# Patient Record
Sex: Female | Born: 1960 | Race: Black or African American | Hispanic: No | Smoking: Never smoker
Health system: Southern US, Community
[De-identification: ages and names within clinical notes are randomized; demographics above are authoritative.]

## PROBLEM LIST (undated history)

## (undated) DIAGNOSIS — T8859XA Other complications of anesthesia, initial encounter: Secondary | ICD-10-CM

## (undated) DIAGNOSIS — J309 Allergic rhinitis, unspecified: Secondary | ICD-10-CM

## (undated) DIAGNOSIS — T4145XA Adverse effect of unspecified anesthetic, initial encounter: Secondary | ICD-10-CM

## (undated) DIAGNOSIS — K5730 Diverticulosis of large intestine without perforation or abscess without bleeding: Secondary | ICD-10-CM

## (undated) DIAGNOSIS — M51369 Other intervertebral disc degeneration, lumbar region without mention of lumbar back pain or lower extremity pain: Secondary | ICD-10-CM

## (undated) DIAGNOSIS — M48061 Spinal stenosis, lumbar region without neurogenic claudication: Secondary | ICD-10-CM

## (undated) DIAGNOSIS — E669 Obesity, unspecified: Secondary | ICD-10-CM

## (undated) DIAGNOSIS — R3129 Other microscopic hematuria: Secondary | ICD-10-CM

## (undated) DIAGNOSIS — M5136 Other intervertebral disc degeneration, lumbar region: Secondary | ICD-10-CM

## (undated) DIAGNOSIS — M722 Plantar fascial fibromatosis: Secondary | ICD-10-CM

## (undated) DIAGNOSIS — K219 Gastro-esophageal reflux disease without esophagitis: Secondary | ICD-10-CM

## (undated) DIAGNOSIS — G4733 Obstructive sleep apnea (adult) (pediatric): Secondary | ICD-10-CM

## (undated) DIAGNOSIS — R42 Dizziness and giddiness: Secondary | ICD-10-CM

## (undated) DIAGNOSIS — G5603 Carpal tunnel syndrome, bilateral upper limbs: Secondary | ICD-10-CM

## (undated) DIAGNOSIS — IMO0002 Reserved for concepts with insufficient information to code with codable children: Secondary | ICD-10-CM

## (undated) DIAGNOSIS — N814 Uterovaginal prolapse, unspecified: Secondary | ICD-10-CM

## (undated) DIAGNOSIS — N8112 Cystocele, lateral: Secondary | ICD-10-CM

## (undated) DIAGNOSIS — E785 Hyperlipidemia, unspecified: Secondary | ICD-10-CM

## (undated) DIAGNOSIS — J189 Pneumonia, unspecified organism: Secondary | ICD-10-CM

## (undated) DIAGNOSIS — N841 Polyp of cervix uteri: Secondary | ICD-10-CM

## (undated) DIAGNOSIS — I10 Essential (primary) hypertension: Principal | ICD-10-CM

## (undated) HISTORY — PX: ROTATOR CUFF REPAIR: SHX139

## (undated) HISTORY — DX: Reserved for concepts with insufficient information to code with codable children: IMO0002

## (undated) HISTORY — DX: Diverticulosis of large intestine without perforation or abscess without bleeding: K57.30

## (undated) HISTORY — DX: Essential (primary) hypertension: I10

## (undated) HISTORY — DX: Dizziness and giddiness: R42

## (undated) HISTORY — DX: Hyperlipidemia, unspecified: E78.5

## (undated) HISTORY — PX: ABDOMINAL HYSTERECTOMY: SUR658

## (undated) HISTORY — DX: Obesity, unspecified: E66.9

## (undated) HISTORY — DX: Cystocele, lateral: N81.12

## (undated) HISTORY — DX: Obstructive sleep apnea (adult) (pediatric): G47.33

## (undated) HISTORY — PX: COLONOSCOPY: SHX174

## (undated) HISTORY — DX: Allergic rhinitis, unspecified: J30.9

## (undated) HISTORY — DX: Uterovaginal prolapse, unspecified: N81.4

## (undated) HISTORY — DX: Gastro-esophageal reflux disease without esophagitis: K21.9

## (undated) HISTORY — DX: Plantar fascial fibromatosis: M72.2

## (undated) HISTORY — DX: Polyp of cervix uteri: N84.1

## (undated) HISTORY — DX: Other microscopic hematuria: R31.29

---

## 1990-11-30 HISTORY — PX: TUBAL LIGATION: SHX77

## 2000-05-18 ENCOUNTER — Ambulatory Visit (HOSPITAL_BASED_OUTPATIENT_CLINIC_OR_DEPARTMENT_OTHER): Admission: RE | Admit: 2000-05-18 | Discharge: 2000-05-18 | Payer: Self-pay | Admitting: Orthopaedic Surgery

## 2001-07-21 ENCOUNTER — Encounter: Payer: Self-pay | Admitting: Family Medicine

## 2001-07-21 ENCOUNTER — Encounter: Admission: RE | Admit: 2001-07-21 | Discharge: 2001-07-21 | Payer: Self-pay | Admitting: Family Medicine

## 2002-05-12 ENCOUNTER — Ambulatory Visit (HOSPITAL_COMMUNITY): Admission: RE | Admit: 2002-05-12 | Discharge: 2002-05-12 | Payer: Self-pay | Admitting: Family Medicine

## 2002-05-12 ENCOUNTER — Encounter: Payer: Self-pay | Admitting: Family Medicine

## 2002-09-21 ENCOUNTER — Ambulatory Visit (HOSPITAL_COMMUNITY): Admission: RE | Admit: 2002-09-21 | Discharge: 2002-09-21 | Payer: Self-pay | Admitting: Family Medicine

## 2002-09-21 ENCOUNTER — Encounter: Payer: Self-pay | Admitting: Family Medicine

## 2003-10-04 ENCOUNTER — Encounter: Admission: RE | Admit: 2003-10-04 | Discharge: 2003-10-04 | Payer: Self-pay | Admitting: Family Medicine

## 2004-10-07 ENCOUNTER — Ambulatory Visit: Payer: Self-pay | Admitting: Family Medicine

## 2004-10-15 ENCOUNTER — Ambulatory Visit: Payer: Self-pay | Admitting: *Deleted

## 2004-12-16 ENCOUNTER — Ambulatory Visit: Payer: Self-pay | Admitting: Family Medicine

## 2004-12-19 ENCOUNTER — Ambulatory Visit: Payer: Self-pay | Admitting: Family Medicine

## 2004-12-23 ENCOUNTER — Ambulatory Visit: Payer: Self-pay | Admitting: Family Medicine

## 2005-01-02 ENCOUNTER — Ambulatory Visit: Payer: Self-pay | Admitting: Family Medicine

## 2005-01-07 ENCOUNTER — Ambulatory Visit: Payer: Self-pay | Admitting: Family Medicine

## 2005-02-23 ENCOUNTER — Ambulatory Visit: Payer: Self-pay | Admitting: Family Medicine

## 2005-06-12 ENCOUNTER — Encounter: Admission: RE | Admit: 2005-06-12 | Discharge: 2005-06-12 | Payer: Self-pay | Admitting: Family Medicine

## 2006-11-25 ENCOUNTER — Encounter: Admission: RE | Admit: 2006-11-25 | Discharge: 2006-11-25 | Payer: Self-pay | Admitting: Emergency Medicine

## 2006-12-09 ENCOUNTER — Ambulatory Visit: Payer: Self-pay | Admitting: Gastroenterology

## 2007-04-29 ENCOUNTER — Ambulatory Visit (HOSPITAL_COMMUNITY): Admission: RE | Admit: 2007-04-29 | Discharge: 2007-04-29 | Payer: Self-pay | Admitting: Emergency Medicine

## 2007-04-30 ENCOUNTER — Ambulatory Visit (HOSPITAL_COMMUNITY): Admission: RE | Admit: 2007-04-30 | Discharge: 2007-04-30 | Payer: Self-pay | Admitting: Emergency Medicine

## 2007-11-15 ENCOUNTER — Ambulatory Visit (HOSPITAL_COMMUNITY): Admission: RE | Admit: 2007-11-15 | Discharge: 2007-11-15 | Payer: Self-pay | Admitting: Obstetrics & Gynecology

## 2008-09-25 ENCOUNTER — Ambulatory Visit: Payer: Self-pay | Admitting: Family Medicine

## 2008-09-25 ENCOUNTER — Encounter (INDEPENDENT_AMBULATORY_CARE_PROVIDER_SITE_OTHER): Payer: Self-pay | Admitting: Family Medicine

## 2008-09-25 DIAGNOSIS — I10 Essential (primary) hypertension: Secondary | ICD-10-CM

## 2008-09-25 DIAGNOSIS — E785 Hyperlipidemia, unspecified: Secondary | ICD-10-CM

## 2008-09-25 HISTORY — DX: Hyperlipidemia, unspecified: E78.5

## 2008-09-25 HISTORY — DX: Essential (primary) hypertension: I10

## 2008-09-25 LAB — CONVERTED CEMR LAB
Ketones, urine, test strip: NEGATIVE
Protein, U semiquant: NEGATIVE
Urobilinogen, UA: 0.2
WBC Urine, dipstick: NEGATIVE
pH: 5.5

## 2008-09-27 LAB — CONVERTED CEMR LAB
Albumin: 4.4 g/dL (ref 3.5–5.2)
Alkaline Phosphatase: 113 units/L (ref 39–117)
BUN: 12 mg/dL (ref 6–23)
Basophils Absolute: 0 10*3/uL (ref 0.0–0.1)
Chloride: 98 meq/L (ref 96–112)
Cholesterol: 295 mg/dL — ABNORMAL HIGH (ref 0–200)
Eosinophils Absolute: 0.1 10*3/uL (ref 0.0–0.7)
GC Probe Amp, Genital: NEGATIVE
Glucose, Bld: 80 mg/dL (ref 70–99)
Hemoglobin: 12.5 g/dL (ref 12.0–15.0)
Lymphocytes Relative: 36 % (ref 12–46)
Lymphs Abs: 2.4 10*3/uL (ref 0.7–4.0)
MCHC: 31 g/dL (ref 30.0–36.0)
Monocytes Relative: 6 % (ref 3–12)
Neutrophils Relative %: 56 % (ref 43–77)
RBC: 4.68 M/uL (ref 3.87–5.11)
RDW: 15.6 % — ABNORMAL HIGH (ref 11.5–15.5)
Sodium: 139 meq/L (ref 135–145)
Total Protein: 7.7 g/dL (ref 6.0–8.3)
Triglycerides: 129 mg/dL (ref <150)
VLDL: 26 mg/dL (ref 0–40)
WBC: 6.6 10*3/uL (ref 4.0–10.5)

## 2008-10-03 ENCOUNTER — Telehealth (INDEPENDENT_AMBULATORY_CARE_PROVIDER_SITE_OTHER): Payer: Self-pay | Admitting: Family Medicine

## 2008-10-08 ENCOUNTER — Encounter (INDEPENDENT_AMBULATORY_CARE_PROVIDER_SITE_OTHER): Payer: Self-pay | Admitting: Family Medicine

## 2008-10-11 ENCOUNTER — Ambulatory Visit: Payer: Self-pay | Admitting: Family Medicine

## 2008-10-11 LAB — CONVERTED CEMR LAB

## 2008-11-15 ENCOUNTER — Ambulatory Visit (HOSPITAL_COMMUNITY): Admission: RE | Admit: 2008-11-15 | Discharge: 2008-11-15 | Payer: Self-pay | Admitting: Family Medicine

## 2008-11-19 ENCOUNTER — Ambulatory Visit: Payer: Self-pay | Admitting: Family Medicine

## 2008-11-19 DIAGNOSIS — N814 Uterovaginal prolapse, unspecified: Secondary | ICD-10-CM | POA: Insufficient documentation

## 2008-11-19 DIAGNOSIS — J029 Acute pharyngitis, unspecified: Secondary | ICD-10-CM

## 2008-11-19 HISTORY — DX: Uterovaginal prolapse, unspecified: N81.4

## 2008-11-30 HISTORY — PX: CHOLECYSTECTOMY: SHX55

## 2009-01-21 ENCOUNTER — Ambulatory Visit: Payer: Self-pay | Admitting: Family Medicine

## 2009-01-21 LAB — CONVERTED CEMR LAB
Chlamydia, Swab/Urine, PCR: NEGATIVE
GC Probe Amp, Urine: NEGATIVE

## 2009-01-22 ENCOUNTER — Encounter (INDEPENDENT_AMBULATORY_CARE_PROVIDER_SITE_OTHER): Payer: Self-pay | Admitting: Family Medicine

## 2009-01-31 ENCOUNTER — Telehealth (INDEPENDENT_AMBULATORY_CARE_PROVIDER_SITE_OTHER): Payer: Self-pay | Admitting: Family Medicine

## 2009-07-09 ENCOUNTER — Telehealth (INDEPENDENT_AMBULATORY_CARE_PROVIDER_SITE_OTHER): Payer: Self-pay | Admitting: Family Medicine

## 2009-09-05 ENCOUNTER — Ambulatory Visit: Payer: Self-pay | Admitting: Physician Assistant

## 2009-09-05 DIAGNOSIS — K219 Gastro-esophageal reflux disease without esophagitis: Secondary | ICD-10-CM | POA: Insufficient documentation

## 2009-09-05 DIAGNOSIS — R109 Unspecified abdominal pain: Secondary | ICD-10-CM | POA: Insufficient documentation

## 2009-09-05 HISTORY — DX: Gastro-esophageal reflux disease without esophagitis: K21.9

## 2009-09-09 ENCOUNTER — Encounter: Payer: Self-pay | Admitting: Physician Assistant

## 2009-09-09 LAB — CONVERTED CEMR LAB
Iron: 60 ug/dL (ref 42–145)
TIBC: 387 ug/dL (ref 250–470)
Vitamin B-12: 397 pg/mL (ref 211–911)

## 2009-09-10 ENCOUNTER — Encounter: Payer: Self-pay | Admitting: Physician Assistant

## 2009-09-10 LAB — CONVERTED CEMR LAB
ALT: 16 units/L (ref 0–35)
AST: 14 units/L (ref 0–37)
Albumin: 4.2 g/dL (ref 3.5–5.2)
Basophils Absolute: 0 10*3/uL (ref 0.0–0.1)
Creatinine, Ser: 0.79 mg/dL (ref 0.40–1.20)
Eosinophils Absolute: 0.5 10*3/uL (ref 0.0–0.7)
Eosinophils Relative: 6 % — ABNORMAL HIGH (ref 0–5)
Glucose, Bld: 91 mg/dL (ref 70–99)
HCT: 37.4 % (ref 36.0–46.0)
Helicobacter pylori, IgM: <0.89 (ref <0.89)
Lymphocytes Relative: 37 % (ref 12–46)
Lymphs Abs: 3.1 10*3/uL (ref 0.7–4.0)
Monocytes Absolute: 0.5 10*3/uL (ref 0.1–1.0)
Monocytes Relative: 6 % (ref 3–12)
Neutro Abs: 4.2 10*3/uL (ref 1.7–7.7)
Neutrophils Relative %: 51 % (ref 43–77)
Platelets: 394 10*3/uL (ref 150–400)
Potassium: 4.1 meq/L (ref 3.5–5.3)
RBC: 4.43 M/uL (ref 3.87–5.11)
Sodium: 142 meq/L (ref 135–145)
Total Bilirubin: 0.6 mg/dL (ref 0.3–1.2)
Total Protein: 7.1 g/dL (ref 6.0–8.3)

## 2009-09-11 ENCOUNTER — Ambulatory Visit (HOSPITAL_COMMUNITY): Admission: RE | Admit: 2009-09-11 | Discharge: 2009-09-11 | Payer: Self-pay | Admitting: Internal Medicine

## 2009-09-12 ENCOUNTER — Emergency Department (HOSPITAL_COMMUNITY): Admission: EM | Admit: 2009-09-12 | Discharge: 2009-09-13 | Payer: Self-pay | Admitting: Emergency Medicine

## 2009-09-16 ENCOUNTER — Telehealth: Payer: Self-pay | Admitting: Physician Assistant

## 2009-09-17 ENCOUNTER — Encounter: Payer: Self-pay | Admitting: Physician Assistant

## 2009-09-25 ENCOUNTER — Telehealth: Payer: Self-pay | Admitting: Physician Assistant

## 2009-09-26 ENCOUNTER — Ambulatory Visit: Payer: Self-pay | Admitting: Internal Medicine

## 2009-10-01 ENCOUNTER — Encounter (INDEPENDENT_AMBULATORY_CARE_PROVIDER_SITE_OTHER): Payer: Self-pay | Admitting: General Surgery

## 2009-10-01 ENCOUNTER — Ambulatory Visit (HOSPITAL_COMMUNITY): Admission: RE | Admit: 2009-10-01 | Discharge: 2009-10-01 | Payer: Self-pay | Admitting: General Surgery

## 2009-10-03 ENCOUNTER — Telehealth: Payer: Self-pay | Admitting: Physician Assistant

## 2009-10-10 ENCOUNTER — Encounter: Payer: Self-pay | Admitting: Physician Assistant

## 2009-10-17 ENCOUNTER — Telehealth: Payer: Self-pay | Admitting: Physician Assistant

## 2009-11-12 ENCOUNTER — Encounter: Payer: Self-pay | Admitting: Physician Assistant

## 2009-11-14 ENCOUNTER — Encounter: Payer: Self-pay | Admitting: Physician Assistant

## 2009-11-18 ENCOUNTER — Encounter: Admission: RE | Admit: 2009-11-18 | Discharge: 2009-11-18 | Payer: Self-pay | Admitting: Internal Medicine

## 2009-11-21 ENCOUNTER — Encounter: Payer: Self-pay | Admitting: Physician Assistant

## 2009-11-25 ENCOUNTER — Telehealth: Payer: Self-pay | Admitting: Physician Assistant

## 2009-11-26 ENCOUNTER — Emergency Department (HOSPITAL_COMMUNITY): Admission: EM | Admit: 2009-11-26 | Discharge: 2009-11-26 | Payer: Self-pay | Admitting: Emergency Medicine

## 2010-01-13 ENCOUNTER — Ambulatory Visit: Payer: Self-pay | Admitting: Physician Assistant

## 2010-01-13 ENCOUNTER — Encounter (INDEPENDENT_AMBULATORY_CARE_PROVIDER_SITE_OTHER): Payer: Self-pay | Admitting: Nurse Practitioner

## 2010-01-13 DIAGNOSIS — IMO0002 Reserved for concepts with insufficient information to code with codable children: Secondary | ICD-10-CM

## 2010-01-13 DIAGNOSIS — R3129 Other microscopic hematuria: Secondary | ICD-10-CM | POA: Insufficient documentation

## 2010-01-13 DIAGNOSIS — N8112 Cystocele, lateral: Secondary | ICD-10-CM

## 2010-01-13 DIAGNOSIS — N951 Menopausal and female climacteric states: Secondary | ICD-10-CM

## 2010-01-13 HISTORY — DX: Cystocele, lateral: N81.12

## 2010-01-13 HISTORY — DX: Other microscopic hematuria: R31.29

## 2010-01-13 LAB — CONVERTED CEMR LAB
Bilirubin Urine: NEGATIVE
Cholesterol, target level: 200 mg/dL
KOH Prep: NEGATIVE
LDL Goal: 160 mg/dL
Rapid HIV Screen: NEGATIVE
WBC Urine, dipstick: NEGATIVE
pH: 6

## 2010-01-14 ENCOUNTER — Encounter (INDEPENDENT_AMBULATORY_CARE_PROVIDER_SITE_OTHER): Payer: Self-pay | Admitting: Nurse Practitioner

## 2010-01-14 LAB — CONVERTED CEMR LAB
ALT: 17 units/L (ref 0–35)
AST: 16 units/L (ref 0–37)
Albumin: 4.4 g/dL (ref 3.5–5.2)
BUN: 17 mg/dL (ref 6–23)
CA 125: 7.3 units/mL (ref 0.0–30.2)
Calcium: 9.6 mg/dL (ref 8.4–10.5)
Cholesterol: 210 mg/dL — ABNORMAL HIGH (ref 0–200)
GC Probe Amp, Genital: NEGATIVE
Glucose, Bld: 92 mg/dL (ref 70–99)
LDL Cholesterol: 137 mg/dL — ABNORMAL HIGH (ref 0–99)
Microalb, Ur: 1.82 mg/dL (ref 0.00–1.89)
Sodium: 140 meq/L (ref 135–145)
TSH: 1.788 microintl units/mL (ref 0.350–4.500)
Total Bilirubin: 0.7 mg/dL (ref 0.3–1.2)
Total Protein: 7.1 g/dL (ref 6.0–8.3)
VLDL: 18 mg/dL (ref 0–40)

## 2010-01-16 LAB — CONVERTED CEMR LAB: Pap Smear: NEGATIVE

## 2010-02-11 ENCOUNTER — Ambulatory Visit: Payer: Self-pay | Admitting: Nurse Practitioner

## 2010-02-12 ENCOUNTER — Encounter: Payer: Self-pay | Admitting: Physician Assistant

## 2010-02-12 LAB — CONVERTED CEMR LAB
CO2: 28 meq/L (ref 19–32)
Calcium: 9.8 mg/dL (ref 8.4–10.5)
Chloride: 100 meq/L (ref 96–112)
Creatinine, Ser: 0.81 mg/dL (ref 0.40–1.20)
Glucose, Bld: 88 mg/dL (ref 70–99)
Potassium: 4.1 meq/L (ref 3.5–5.3)

## 2010-02-14 ENCOUNTER — Telehealth (INDEPENDENT_AMBULATORY_CARE_PROVIDER_SITE_OTHER): Payer: Self-pay | Admitting: *Deleted

## 2010-03-03 ENCOUNTER — Encounter (INDEPENDENT_AMBULATORY_CARE_PROVIDER_SITE_OTHER): Payer: Self-pay

## 2010-03-04 ENCOUNTER — Ambulatory Visit: Payer: Self-pay | Admitting: Gastroenterology

## 2010-03-05 ENCOUNTER — Ambulatory Visit: Payer: Self-pay | Admitting: Physician Assistant

## 2010-03-07 ENCOUNTER — Encounter (INDEPENDENT_AMBULATORY_CARE_PROVIDER_SITE_OTHER): Payer: Self-pay | Admitting: Nurse Practitioner

## 2010-03-18 ENCOUNTER — Ambulatory Visit: Payer: Self-pay | Admitting: Gastroenterology

## 2010-03-26 ENCOUNTER — Encounter: Payer: Self-pay | Admitting: Physician Assistant

## 2010-03-26 ENCOUNTER — Telehealth (INDEPENDENT_AMBULATORY_CARE_PROVIDER_SITE_OTHER): Payer: Self-pay | Admitting: Nurse Practitioner

## 2010-03-26 DIAGNOSIS — K5730 Diverticulosis of large intestine without perforation or abscess without bleeding: Secondary | ICD-10-CM

## 2010-03-26 HISTORY — DX: Diverticulosis of large intestine without perforation or abscess without bleeding: K57.30

## 2010-04-16 ENCOUNTER — Telehealth: Payer: Self-pay | Admitting: Physician Assistant

## 2010-06-20 ENCOUNTER — Encounter: Payer: Self-pay | Admitting: Physician Assistant

## 2010-06-20 DIAGNOSIS — G4733 Obstructive sleep apnea (adult) (pediatric): Secondary | ICD-10-CM | POA: Insufficient documentation

## 2010-06-20 DIAGNOSIS — Z9989 Dependence on other enabling machines and devices: Secondary | ICD-10-CM

## 2010-06-20 HISTORY — DX: Obstructive sleep apnea (adult) (pediatric): G47.33

## 2010-06-23 ENCOUNTER — Encounter: Payer: Self-pay | Admitting: Physician Assistant

## 2010-08-22 ENCOUNTER — Telehealth: Payer: Self-pay | Admitting: Physician Assistant

## 2010-08-28 ENCOUNTER — Ambulatory Visit: Payer: Self-pay | Admitting: Nurse Practitioner

## 2010-08-28 DIAGNOSIS — Z6841 Body Mass Index (BMI) 40.0 and over, adult: Secondary | ICD-10-CM

## 2010-08-28 DIAGNOSIS — E669 Obesity, unspecified: Secondary | ICD-10-CM

## 2010-08-28 HISTORY — DX: Obesity, unspecified: E66.9

## 2010-11-14 ENCOUNTER — Telehealth (INDEPENDENT_AMBULATORY_CARE_PROVIDER_SITE_OTHER): Payer: Self-pay | Admitting: Nurse Practitioner

## 2010-12-02 ENCOUNTER — Telehealth (INDEPENDENT_AMBULATORY_CARE_PROVIDER_SITE_OTHER): Payer: Self-pay | Admitting: Nurse Practitioner

## 2010-12-02 ENCOUNTER — Emergency Department (HOSPITAL_COMMUNITY)
Admission: EM | Admit: 2010-12-02 | Discharge: 2010-12-02 | Payer: Self-pay | Source: Home / Self Care | Admitting: Emergency Medicine

## 2010-12-03 ENCOUNTER — Ambulatory Visit: Admit: 2010-12-03 | Payer: Self-pay | Admitting: Internal Medicine

## 2010-12-04 ENCOUNTER — Telehealth (INDEPENDENT_AMBULATORY_CARE_PROVIDER_SITE_OTHER): Payer: Self-pay | Admitting: Nurse Practitioner

## 2010-12-04 ENCOUNTER — Ambulatory Visit
Admission: RE | Admit: 2010-12-04 | Discharge: 2010-12-04 | Payer: Self-pay | Source: Home / Self Care | Attending: Nurse Practitioner | Admitting: Nurse Practitioner

## 2010-12-11 ENCOUNTER — Telehealth (INDEPENDENT_AMBULATORY_CARE_PROVIDER_SITE_OTHER): Payer: Self-pay | Admitting: Nurse Practitioner

## 2010-12-20 ENCOUNTER — Encounter: Payer: Self-pay | Admitting: Family Medicine

## 2010-12-30 NOTE — Letter (Signed)
Summary: [facility]   Imported By: Arta Bruce 12/13/2009 12:33:27  _____________________________________________________________________  External Attachment:    Type:   Image     Comment:   External Document

## 2010-12-30 NOTE — Assessment & Plan Note (Signed)
Summary: HTN   Vital Signs:  Patient profile:   50 year old female Menstrual status:  postmenopausal Weight:      313.6 pounds BMI:     50.80 Temp:     97.3 degrees F oral Pulse rate:   76 / minute Pulse rhythm:   regular Resp:     20 per minute BP sitting:   160 / 100  (left arm) Cuff size:   large  Vitals Entered By: Levon Hedger (August 28, 2010 10:44 AM)  Nutrition Counseling: Patient's BMI is greater than 25 and therefore counseled on weight management options. CC: follow-up visit BP, Hypertension Management, Lipid Management Pain Assessment Patient in pain? no       Does patient need assistance? Functional Status Self care Ambulation Normal Comments pt is not taking pravastatin had too many complications with medication   CC:  follow-up visit BP, Hypertension Management, and Lipid Management.  History of Present Illness:  Pt into the office for f/u on HTN  Hypertension History:      She complains of peripheral edema, but denies headache, chest pain, and palpitations.  She notes no problems with any antihypertensive medication side effects.  Rx refilled on clonidine last week. Although she has only been taking it only at night.  Pt seems to think that it was affecting her mood. She is also taking HCTZ.        Positive major cardiovascular risk factors include hyperlipidemia and hypertension.  Negative major cardiovascular risk factors include female age less than 52 years old, no history of diabetes, negative family history for ischemic heart disease, and non-tobacco-user status.        Further assessment for target organ damage reveals no history of ASHD, cardiac end-organ damage (CHF/LVH), stroke/TIA, peripheral vascular disease, renal insufficiency, or hypertensive retinopathy.    Lipid Management History:      Positive NCEP/ATP III risk factors include hypertension.  Negative NCEP/ATP III risk factors include female age less than 109 years old, non-diabetic,  no family history for ischemic heart disease, non-tobacco-user status, no ASHD (atherosclerotic heart disease), no prior stroke/TIA, no peripheral vascular disease, and no history of aortic aneurysm.        The patient states that she does not know about the "Therapeutic Lifestyle Change" diet.  The patient does not know about adjunctive measures for cholesterol lowering.  Comments include: aches in the shoulders.      Habits & Providers  Alcohol-Tobacco-Diet     Alcohol drinks/day: 0     Tobacco Status: never     Passive Smoke Exposure: no  Exercise-Depression-Behavior     Does Patient Exercise: no     Depression Counseling: not indicated; screening negative for depression     Drug Use: no     Seat Belt Use: 100     Sun Exposure: occasionally  Allergies (verified): 1)  ! Promethazine Hcl (Promethazine Hcl) 2)  ! * Bp Pill 3)  ! Ace Inhibitors  Review of Systems General:  Denies fever. CV:  Complains of swelling of feet. Resp:  Denies cough. GI:  Complains of nausea and vomiting; denies abdominal pain.  Physical Exam  General:  alert.   Head:  normocephalic.   Lungs:  normal breath sounds.   Heart:  normal rate and regular rhythm.   Abdomen:  soft, non-tender, and normal bowel sounds.   Msk:  up to the exam table Neurologic:  alert & oriented X3.   Skin:  color normal.   Psych:  Oriented X3.     Impression & Recommendations:  Problem # 1:  ESSENTIAL HYPERTENSION (ICD-401.9) BP is elevated today. advised pt to take clonidine two times a day  DASH diet reviewed Her updated medication list for this problem includes:    Hydrochlorothiazide 25 Mg Tabs (Hydrochlorothiazide) .Marland Kitchen... 1 by mouth once daily    Clonidine Hcl 0.1 Mg Tabs (Clonidine hcl) .Marland Kitchen... Take one tablet two times a day    Furosemide 20 Mg Tabs (Furosemide) ..... One tablet by mouth daily as needed for fluid  Problem # 2:  HYPERLIPIDEMIA (ICD-272.4) pt is not taking meds she would like to try lifestyle  modifications will recheck 12/2010 Her updated medication list for this problem includes:    Pravastatin Sodium 10 Mg Tabs (Pravastatin sodium) ..... Hold  Problem # 3:  OBESITY (ICD-278.00) advised pt to increase diet and exercise will sign pt up for a nutrition class  Complete Medication List: 1)  Ibuprofen 800 Mg Tabs (Ibuprofen) .... Take one tablet by mouth as needed every six hours 2)  Multivitamins Caps (Multiple vitamin) .... Take one tablet by mouth three times per week 3)  Pravastatin Sodium 10 Mg Tabs (Pravastatin sodium) .... Hold 4)  Hydrochlorothiazide 25 Mg Tabs (Hydrochlorothiazide) .Marland Kitchen.. 1 by mouth once daily 5)  Protonix 40 Mg Tbec (Pantoprazole sodium) .... Take 1 tablet by mouth two times a day 6)  Clonidine Hcl 0.1 Mg Tabs (Clonidine hcl) .... Take one tablet two times a day 7)  Furosemide 20 Mg Tabs (Furosemide) .... One tablet by mouth daily as needed for fluid  Other Orders: Flu Vaccine 26yrs + (13086) Admin 1st Vaccine (57846) Admin 1st Vaccine [facility]) [phone])  Hypertension Assessment/Plan:      The patient's hypertensive risk group is category B: At least one risk factor (excluding diabetes) with no target organ damage.  Her calculated 10 year risk of coronary heart disease is 7 %.  Today's blood pressure is 160/100.  Her blood pressure goal is < 140/90.  Lipid Assessment/Plan:      Based on NCEP/ATP III, the patient's risk factor category is "0-1 risk factors".  The patient's lipid goals are as follows: Total cholesterol goal is 200; LDL cholesterol goal is 160; HDL cholesterol goal is 40; Triglyceride goal is 150.    Patient Instructions: 1)  High Blood pressure - You need to take your blood pressure pills (clonidine 0.1mg  by mouth two times a day) 2)  You should also take your HCTZ 25mg  by mouth daily. 3)  Follow up in this office 2 weeks for blood pressure 4)  Take your blood pressure medications before this visit 5)  check lasix use. 6)  You will be  signed up for the nutrition class Prescriptions: FUROSEMIDE 20 MG TABS (FUROSEMIDE) One tablet by mouth daily as needed for fluid  #10 x 0   Entered and Authorized by:   Lehman Prom FNP   Signed by:   Lehman Prom FNP on 08/28/2010   Method used:   Print then Give to Patient   RxID:   [id number]   Prevention & Chronic Care Immunizations   Influenza vaccine: Fluvax 3+  (08/28/2010)    Tetanus booster: 09/25/2008: Tdap    Pneumococcal vaccine: Not documented  Other Screening   Pap smear:  Specimen Adequacy: Satisfactory for evaluation.   Interpretation/Result:Negative for intraepithelial Lesion or Malignancy.     (01/13/2010)   Pap smear due: 12/2010    Mammogram: ASSESSMENT: Negative - BI-RADS 1^MM DIGITAL SCREENING  (  11/18/2009)   Smoking status: never  (08/28/2010)  Lipids   Total Cholesterol: 210  (01/13/2010)   LDL: 137  (01/13/2010)   LDL Direct: Not documented   HDL: 55  (01/13/2010)   Triglycerides: 90  (01/13/2010)    SGOT (AST): 16  (01/13/2010)   SGPT (ALT): 17  (01/13/2010)   Alkaline phosphatase: 92  (01/13/2010)   Total bilirubin: 0.7  (01/13/2010)  Hypertension   Last Blood Pressure: 160 / 100  (08/28/2010)   Serum creatinine: 0.81  (02/12/2010)   Serum potassium 4.1  (02/12/2010)  Self-Management Support :    Hypertension self-management support: Not documented    Lipid self-management support: Not documented    Nursing Instructions: Give Flu vaccine today      Influenza Vaccine    Vaccine Type: Fluvax 3+    Site: right deltoid    Mfr: [employer]oSmithK[employer]    Dose: 0.5 ml    Route: IM    Given by: Levon Hedger    Exp. Date: 05/2011    Lot #: [id number]    VIS given: 06/24/10 version given August 28, 2010.  Flu Vaccine Consent Questions    Do you have a history of severe allergic reactions to this vaccine? no    Any prior history of allergic reactions to egg and/or gelatin? no    Do you have a sensitivity to the  preservative Thimersol? no    Do you have a past history of Guillan-Barre Syndrome? no    Do you currently have an acute febrile illness? no    Have you ever had a severe reaction to latex? no    Vaccine information given and explained to patient? no    Are you currently pregnant? no NDC  [phone]

## 2010-12-30 NOTE — Progress Notes (Signed)
Summary: Requesting refills  Phone Note Call from Patient Call back at [facility] Phone [phone]   Summary of Call: The pt is requesting more refills from clonidine medication [facility] ([address]) Alben Spittle PA-c Initial call taken by: Manon Hilding,  Apr 16, 2010 11:11 AM  Follow-up for Phone Call        forward to provider, last filled on 02-11-10 with one refill.[address] Follow-up by: Arm[place] Shannon,  Apr 16, 2010 12:24 PM  Additional Follow-up for Phone Call Additional follow up Details #1::        pt is aware Additional Follow-up by: Armenia Shannon,  Apr 17, 2010 8:10 AM    Prescriptions: CLONIDINE HCL 0.1 MG TABS (CLONIDINE HCL) take one tablet two times a day  #60 x 3   Entered and Authorized by:   Tereso Newcomer PA-C   Signed by:   Tereso Newcomer PA-C on 04/16/2010   Method used:   Electronically to        [facility] [address] [phone]* (retail)       [address]       [place], [place]  [address]       Ph: [phone] or [phone]       Fax: [phone]   RxID:   [id number]

## 2010-12-30 NOTE — Miscellaneous (Signed)
Summary: Colo done 4.2011 . . . needs repeat in 2016  Clinical Lists Changes  Problems: Added new problem of DIVERTICULOSIS, COLON (ICD-562.10) Observations: Added new observation of PAST MED HX: Current Problems:  HYPERLIPIDEMIA (ICD-272.4) ESSENTIAL HYPERTENSION (ICD-401.9) LBP...chronic...sees Dr[facility]Hilts at [facility]land Kitchenlast seen 9/09.  h/o MRSA abscess in 2006 Colo done in 02/2010. . . needs repeat in 2016  (03/26/2010 13:40) Added new observation of COLONRECACT: Repeat colonoscopy in 5 years.  (03/18/2010 13:40) Added new observation of COLONOSCOPY:  Results: Diverticulosis.        (03/18/2010 13:40)      Colonoscopy  Procedure date:  03/18/2010  Findings:       Results: Diverticulosis.         Comments:      Repeat colonoscopy in 5 years.     Past History:  Past Medical History: Current Problems:  HYPERLIPIDEMIA (ICD-272.4) ESSENTIAL HYPERTENSION (ICD-401.9) LBP...chronic...sees Dr.Hilts at [facility]Marland Kitchenlast seen 9/09.  h/o MRSA abscess in 2006 Colo done in 02/2010. . . needs repeat in 2016

## 2010-12-30 NOTE — Progress Notes (Signed)
Summary: Colonscopy update  Phone Note Outgoing Call   Summary of Call: Rec'd Colo report on Ms. Su Hilt. Saw that you did her CPP. Not sure if she is still mine or yours now. FYI. . . Needs repeat colo in 2016 Initial call taken by: Brynda Rim,  March 26, 2010 1:43 PM  Follow-up for Phone Call        noted Follow-up by: Lehman Prom FNP,  March 27, 2010 12:37 PM

## 2010-12-30 NOTE — Assessment & Plan Note (Signed)
Summary: 3-4 WEEK FU FOR BP CHECK//KT  Nurse Visit   Vital Signs:  Patient profile:   50 year old female Menstrual status:  postmenopausal Pulse rate:   74 / minute Pulse rhythm:   regular BP sitting:   131 / 58  (left arm) Cuff size:   large  Vitals Entered By: Levon Hedger (March 05, 2010 10:13 AM) CC: 3-4 week BP check...still having swelling in her feet...pt states medication makes her lightheaded Comments Per Oakley Orban BP is better today.   Allergies: 1)  ! Promethazine Hcl (Promethazine Hcl) 2)  ! * Bp Pill 3)  ! Ace Inhibitors  Orders Added: 1)  Est. Patient Level I [73220]  Prevention & Chronic Care Immunizations   Influenza vaccine: Fluvax 3+  (09/05/2009)    Tetanus booster: 09/25/2008: Tdap    Pneumococcal vaccine: Not documented  Other Screening   Pap smear:  Specimen Adequacy: Satisfactory for evaluation.   Interpretation/Result:Negative for intraepithelial Lesion or Malignancy.     (01/13/2010)   Pap smear due: 12/2010    Mammogram: ASSESSMENT: Negative - BI-RADS 1^MM DIGITAL SCREENING  (11/18/2009)   Smoking status: never  (01/13/2010)  Lipids   Total Cholesterol: 210  (01/13/2010)   LDL: 137  (01/13/2010)   LDL Direct: Not documented   HDL: 55  (01/13/2010)   Triglycerides: 90  (01/13/2010)    SGOT (AST): 16  (01/13/2010)   SGPT (ALT): 17  (01/13/2010)   Alkaline phosphatase: 92  (01/13/2010)   Total bilirubin: 0.7  (01/13/2010)  Hypertension   Last Blood Pressure: 131 / 58  (03/05/2010)   Serum creatinine: 0.81  (02/12/2010)   Serum potassium 4.1  (02/12/2010)  Self-Management Support :    Hypertension self-management support: Not documented    Lipid self-management support: Not documented

## 2010-12-30 NOTE — Assessment & Plan Note (Signed)
Summary: Complete Physical exam   Vital Signs:  Patient profile:   50 year old female Menstrual status:  postmenopausal Height:      66 inches Weight:      301.6 pounds Temp:     98.0 degrees F oral Pulse rate:   88 / minute Pulse rhythm:   regular Resp:     24 per minute BP sitting:   143 / 89  (left arm) Cuff size:   large  Vitals Entered By: Arthor Captain (January 13, 2010 10:16 AM) CC: CPP, Lipid Management, Abdominal Pain, Hypertension Management Is Patient Diabetic? No Pain Assessment Patient in pain? no       Does patient need assistance? Functional Status Self care Ambulation Normal LMP - Character: normal     Menstrual Status postmenopausal Last PAP Result NEGATIVE FOR INTRAEPITHELIAL LESIONS OR MALIGNANCY.   CC:  CPP, Lipid Management, Abdominal Pain, and Hypertension Management.  History of Present Illness:  Pt into the office for a complete physical exam.  Pap - Last done 1 year ago.  All previous PAP Smears ok. post-menopausal since for 1 year. 3 children, natural births Divorced  Mammogram - last done 11/18/2009 Maternal great aunt with breast cancer  Optho - no glasses or contact.  Last eye exam done back in 09/2009 was ok  Dental - last dental exam was 2 years ago. she maintains good hygiene with brusing and flossing.  tdap - up to date.  GI - Mother has a history of colon cancer and is a Physiological scientist. Never had a colonscopy.  Dyspepsia History:      There is a prior history of GERD.  The patient does not have a prior history of documented ulcer disease.  The dominant symptom is not heartburn or acid reflux.  An H-2 blocker medication is not currently being taken.  She has no history of a positive H. Pylori serology.  No previous upper endoscopy has been done.    Hypertension History:      She complains of peripheral edema, but denies headache, chest pain, and palpitations.  She takes her BP meds at night. She has an allergy to lisinopril  and she had to go to the ER and so she stopped the meds.  Pt started taking her son's norvasc and she noticed the swelling in her legs intensified.        Positive major cardiovascular risk factors include hyperlipidemia and hypertension.  Negative major cardiovascular risk factors include female age less than 86 years old, no history of diabetes, negative family history for ischemic heart disease, and non-tobacco-user status.        Further assessment for target organ damage reveals no history of ASHD, cardiac end-organ damage (CHF/LVH), stroke/TIA, peripheral vascular disease, renal insufficiency, or hypertensive retinopathy.    Lipid Management History:      Positive NCEP/ATP III risk factors include hypertension.  Negative NCEP/ATP III risk factors include female age less than 52 years old, non-diabetic, no family history for ischemic heart disease, non-tobacco-user status, no ASHD (atherosclerotic heart disease), no prior stroke/TIA, no peripheral vascular disease, and no history of aortic aneurysm.        The patient states that she does not know about the "Therapeutic Lifestyle Change" diet.  The patient does not know about adjunctive measures for cholesterol lowering.  Comments include: Pt is taking crestor but she only takes every other night. She was previously on lipitor but she had similar problems with joints so she stopped taking  it as well.  The patient denies any symptoms to suggest myopathy or liver disease.      Habits & Providers  Alcohol-Tobacco-Diet     Alcohol drinks/day: 0     Tobacco Status: never     Passive Smoke Exposure: no  Exercise-Depression-Behavior     Does Patient Exercise: no     Have you felt down or hopeless? no     Have you felt little pleasure in things? no     Depression Counseling: not indicated; screening negative for depression     Drug Use: no     Seat Belt Use: 100     Sun Exposure: occasionally  Allergies (verified): 1)  ! Promethazine Hcl  (Promethazine Hcl) 2)  ! * Bp Pill 3)  ! Ace Inhibitors  Review of Systems General:  Denies fever; night sweats. Eyes:  Denies discharge. ENT:  Denies earache. CV:  Complains of swelling of feet; denies chest pain or discomfort and palpitations. Resp:  Denies cough. GI:  Denies abdominal pain, constipation, diarrhea, nausea, and vomiting. GU:  Denies discharge. MS:  Complains of joint pain. Derm:  Denies rash. Neuro:  Denies headaches. Psych:  Denies depression. Endo:  Denies excessive urination.  Physical Exam  General:  alert.  obese Head:  normocephalic.   Eyes:  vision grossly intact, pupils equal, and pupils round.   Ears:  R ear normal and L ear normal.  TM visible with bony landmarks Nose:  no nasal discharge.   Mouth:  pharynx pink and moist and fair dentition.   Neck:  supple.   Breasts:  skin/areolae normal, no masses, and no abnormal thickening.  pendulous Lungs:  normal breath sounds.   Heart:  normal rate and regular rhythm.   Abdomen:  normal bowel sounds.   Rectal:  no external abnormalities.  guaiac negative Skin:  color normal.   Psych:  Oriented X3.    Pelvic Exam  Vulva:      normal appearance.   Urethra and Bladder:      Urethra--normal.   Vagina:      cystocele Grade II Cervix:      midposition.  12 o'clock - dark lesion Adnexa:      mobile bilaterally.   Rectum:      normal, heme negative stool.      Impression & Recommendations:  Problem # 1:  ROUTINE GYNECOLOGICAL EXAMINATION (ICD-V72.31) PAP done labs done guaiac negative PHQ-9 score = 16 rec optho and dental exam EKG NSR self breast exam placcard given Orders: Hemoccult Guaiac-1 spec.(in office) (82270) T- GC Chlamydia (08657) KOH/ WET Mount [phone]) Pap Smear, Thin Prep ( Collection of) (E9528) Rapid HIV  (41324) Syphilis Test (RPR) (40102-72536) T-TSH (64403-47425)  Problem # 2:  ESSENTIAL HYPERTENSION (ICD-401.9) DASH diet will add atenolol Her updated medication  list for this problem includes:    Hydrochlorothiazide 25 Mg Tabs (Hydrochlorothiazide) .Marland Kitchen... 1 by mouth once daily    Atenolol 25 Mg Tabs (Atenolol) ..... One tablet by mouth nightly for blood pressure  Orders: EKG w/ Interpretation (93000) UA Dipstick w/o Micro (manual) (95638) T-Urine Microalbumin w/creat. ratio [phone]) T-Lipid Profile [phone]) T-Comprehensive Metabolic Panel (09323-55732)  Problem # 3:  GERD (ICD-530.81)  Her updated medication list for this problem includes:    Protonix 40 Mg Tbec (Pantoprazole sodium) .Marland Kitchen... Take 1 tablet by mouth two times a day  Problem # 4:  HYPERLIPIDEMIA (ICD-272.4) will check lipds today no consecutive use of crestor due to joint pain will likely  need another medication Her updated medication list for this problem includes:    Crestor 10 Mg Tabs (Rosuvastatin calcium) .Marland Kitchen... Take 1 tablet by mouth once a day  Orders: T-Lipid Profile (09811-91478) T-Comprehensive Metabolic Panel (29562-13086)  Problem # 5:  POSTMENOPAUSAL SYNDROME (ICD-627.9) herbal meds - handout given to pt  Complete Medication List: 1)  Ibuprofen 800 Mg Tabs (Ibuprofen) .... Take one tablet by mouth as needed every six hours 2)  Diclofenac Sodium 75 Mg Tbec (Diclofenac sodium) .... Take one tablet by mouth bid  as needed(dr.hilts) 3)  Baclofen 10 Mg Tabs (Baclofen) .... Take one by mouth as neededdr.hilts) 4)  Multivitamins Caps (Multiple vitamin) .... Take one tablet by mouth three times per week 5)  Crestor 10 Mg Tabs (Rosuvastatin calcium) .... Take 1 tablet by mouth once a day 6)  Hydrochlorothiazide 25 Mg Tabs (Hydrochlorothiazide) .Marland Kitchen.. 1 by mouth once daily 7)  Protonix 40 Mg Tbec (Pantoprazole sodium) .... Take 1 tablet by mouth two times a day 8)  Atenolol 25 Mg Tabs (Atenolol) .... One tablet by mouth nightly for blood pressure  Other Orders: Gastroenterology Referral (GI) T- CA 125 ([id number]) T-Culture, Urine  ([id number])  Hypertension Assessment/Plan:      The patient's hypertensive risk group is category B: At least one risk factor (excluding diabetes) with no target organ damage.  Her calculated 10 year risk of coronary heart disease is 8 %.  Today's blood pressure is 143/89.  Her blood pressure goal is < 140/90.  Lipid Assessment/Plan:      Based on NCEP/ATP III, the patient's risk factor category is "2 or more risk factors and a calculated 10 year CAD risk of < 20%".  The patient's lipid goals are as follows: Total cholesterol goal is 200; LDL cholesterol goal is 160; HDL cholesterol goal is 40; Triglyceride goal is 150.     Patient Instructions: 1)  You will be notified of any abnormal labs 2)  Blood pressure - not controlled 3)  start atenolol 25mg  by mouth nightly for blood pressure 4)  remember to avoid salty foods 5)  Cholesterol - you will likely need some medications but the provider will review your lab results and let you know what is best for you to take. Aviod fried fatty foods. Eat oatmeal, cherrios 6)  Hot flashes - try herbal rememdies (read handout) 7)  Follow up in 4 weeks for blood pressure check (nurse visit) Prescriptions: PROTONIX 40 MG TBEC (PANTOPRAZOLE SODIUM) Take 1 tablet by mouth two times a day  #60 x 5   Entered and Authorized by:   Lehman Prom FNP   Signed by:   Lehman Prom FNP on 01/13/2010   Method used:   Print then Give to Patient   RxID:   [id number] HYDROCHLOROTHIAZIDE 25 MG TABS (HYDROCHLOROTHIAZIDE) 1 by mouth once daily  #30 x 5   Entered and Authorized by:   Lehman Prom FNP   Signed by:   Lehman Prom FNP on 01/13/2010   Method used:   Print then Give to Patient   RxID:   [id number] ATENOLOL 25 MG TABS (ATENOLOL) One tablet by mouth nightly for blood pressure  #30 x 5   Entered and Authorized by:   Lehman Prom FNP   Signed by:   Lehman Prom FNP on 01/13/2010   Method used:   Print then Give to Patient   RxID:    [id number]   Laboratory Results   Urine Tests  Date/Time Received: January 13, 2010  10:43 AM  Date/Time Reported: January 13, 2010 10:43 AM   Routine Urinalysis   Color: yellow Appearance: Clear Glucose: negative   (Normal Range: Negative) Bilirubin: negative   (Normal Range: Negative) Ketone: negative   (Normal Range: Negative) Spec. Gravity: 1.015   (Normal Range: 1.003-1.035) Blood: trace-intact   (Normal Range: Negative) pH: 6.0   (Normal Range: 5.0-8.0) Protein: 30   (Normal Range: Negative) Urobilinogen: 0.2   (Normal Range: 0-1) Nitrite: negative   (Normal Range: Negative) Leukocyte Esterace: negative   (Normal Range: Negative)    Date/Time Received: January 13, 2010 11:43 AM   Wet Mount/KOH Source: vaginal WBC/hpf: 1-5 Bacteria/hpf: rare Clue cells/hpf: few Yeast/hpf: none Trichomonas/hpf: none  Other Tests  Rapid HIV: negative  Stool - Occult Blood Hemmoccult #1: negative Date: 01/13/2010    Laboratory Results   Urine Tests    Routine Urinalysis   Color: yellow Appearance: Clear Glucose: negative   (Normal Range: Negative) Bilirubin: negative   (Normal Range: Negative) Ketone: negative   (Normal Range: Negative) Spec. Gravity: 1.015   (Normal Range: 1.003-1.035) Blood: trace-intact   (Normal Range: Negative) pH: 6.0   (Normal Range: 5.0-8.0) Protein: 30   (Normal Range: Negative) Urobilinogen: 0.2   (Normal Range: 0-1) Nitrite: negative   (Normal Range: Negative) Leukocyte Esterace: negative   (Normal Range: Negative)      Wet [facility] KOH: Negative  Other Tests  Rapid HIV: negative  Stool - Occult Blood Hemmoccult #1: negative    EKG  Procedure date:  01/13/2010  Findings:      NSR

## 2010-12-30 NOTE — Miscellaneous (Signed)
  Clinical Lists Changes  Problems: Added new problem of SLEEP APNEA, OBSTRUCTIVE (ICD-327.23) Observations: Added new observation of PAST MED HX: Current Problems:  HYPERLIPIDEMIA (ICD-272.4) ESSENTIAL HYPERTENSION (ICD-401.9) LBP...chronic...sees Dr[facility]Hilts at [facility] Kitchenlast seen 9/09.  h/o MRSA abscess in 2006 Colo done in 02/2010. . . needs repeat in 2016 Sleep Apnea on CPAP (06/20/2010 16:42)       Past History:  Past Medical History: Current Problems:  HYPERLIPIDEMIA (ICD-272.4) ESSENTIAL HYPERTENSION (ICD-401.9) LBP...chronic...sees Dr.Hilts at [facility]Marland Kitchenlast seen 9/09.  h/o MRSA abscess in 2006 Colo done in 02/2010. . . needs repeat in 2016 Sleep Apnea on CPAP

## 2010-12-30 NOTE — Letter (Signed)
Summary: HOME MEDICAL EQUIPMENT/FAXED  HOME MEDICAL EQUIPMENT/FAXED   Imported By: Arta Bruce 06/23/2010 11:05:30  _____________________________________________________________________  External Attachment:    Type:   Image     Comment:   External Document

## 2010-12-30 NOTE — Letter (Signed)
Summary: [facility] Instructions  [facility]  [address] [place], [place] [address]   Phone: [phone]  Fax: [phone]       NARALY FRITCHER    [birth year]    MRN: [id number]        Procedure Day /Date:  03/18/10  Tuesday     Arrival Time:  2:00pm      Procedure Time:  3:00 pm     Location of Procedure:                    _x _  [facility] (4th Floor)                        PREPARATION FOR COLONOSCOPY WITH MOVIPREP   Starting 5 days prior to your procedure 03/13/10 do not eat nuts, seeds, popcorn, corn, beans, peas,  salads, or any raw vegetables.  Do not take any fiber supplements (e.g. Metamucil, Citrucel, and Benefiber).  THE DAY BEFORE YOUR PROCEDURE         DATE:  03/17/10  DAY:  Monday  1.  Drink clear liquids the entire day-NO SOLID FOOD  2.  Do not drink anything colored red or purple.  Avoid juices with pulp.  No orange juice.  3.  Drink at least 64 oz. (8 glasses) of fluid/clear liquids during the day to prevent dehydration and help the prep work efficiently.  CLEAR LIQUIDS INCLUDE: Water Jello Ice Popsicles Tea (sugar ok, no milk/cream) Powdered fruit flavored drinks Coffee (sugar ok, no milk/cream) Gatorade Juice: apple, white grape, white cranberry  Lemonade Clear bullion, consomm, broth Carbonated beverages (any kind) Strained chicken noodle soup Hard Candy                             4.  In the morning, mix first dose of MoviPrep solution:    Empty 1 Pouch A and 1 Pouch B into the disposable container    Add lukewarm drinking water to the top line of the container. Mix to dissolve    Refrigerate (mixed solution should be used within 24 hrs)  5.  Begin drinking the prep at 5:00 p.m. The MoviPrep container is divided by 4 marks.   Every 15 minutes drink the solution down to the next mark (approximately 8 oz) until the full liter is complete.   6.  Follow completed prep with 16 oz of clear liquid of your choice  (Nothing red or purple).  Continue to drink clear liquids until bedtime.  7.  Before going to bed, mix second dose of MoviPrep solution:    Empty 1 Pouch A and 1 Pouch B into the disposable container    Add lukewarm drinking water to the top line of the container. Mix to dissolve    Refrigerate  THE DAY OF YOUR PROCEDURE      DATE:  03/18/10  DAY:  Monday  Beginning at  10:00 a.m. (5 hours before procedure):         1. Every 15 minutes, drink the solution down to the next mark (approx 8 oz) until the full liter is complete.  2. Follow completed prep with 16 oz. of clear liquid of your choice.    3. You may drink clear liquids until  1:00pm  (2 HOURS BEFORE PROCEDURE).   MEDICATION INSTRUCTIONS  Unless otherwise instructed, you should take regular prescription medications with a small sip  of water   as early as possible the morning of your procedure.   Additional medication instructions: Do not take HCTZ am of procedure.         OTHER INSTRUCTIONS  You will need a responsible adult at least 51 years of age to accompany you and drive you home.   This person must remain in the waiting room during your procedure.  Wear loose fitting clothing that is easily removed.  Leave jewelry and other valuables at home.  However, you may wish to bring a book to read or  an iPod/MP3 player to listen to music as you wait for your procedure to start.  Remove all body piercing jewelry and leave at home.  Total time from sign-in until discharge is approximately 2-3 hours.  You should go home directly after your procedure and rest.  You can resume normal activities the  day after your procedure.  The day of your procedure you should not:   Drive   Make legal decisions   Operate machinery   Drink alcohol   Return to work  You will receive specific instructions about eating, activities and medications before you leave.    The above instructions have been reviewed and  explained to me by   Ulis Rias RN  March 04, 2010 10:17 AM     I fully understand and can verbalize these instructions _____________________________ Date _________

## 2010-12-30 NOTE — Progress Notes (Signed)
Summary: Office Visit//DEPRESSION SCREENING  Office Visit//DEPRESSION SCREENING   Imported By: Arta Bruce 03/07/2010 12:14:58  _____________________________________________________________________  External Attachment:    Type:   Image     Comment:   External Document

## 2010-12-30 NOTE — Letter (Signed)
Summary: Handout Printed  Printed Handout:  - Menopause and Herbal Products 

## 2010-12-30 NOTE — Progress Notes (Signed)
Summary: Clonidine  Phone Note Outgoing Call   Summary of Call: Do you want to fill her clonidine?  Last seen 12/2009.   Initial call taken by: Dutch Quint RN,  August 22, 2010 12:56 PM  Follow-up for Phone Call        Yes.  Fill x 6 mos. Needs f/u visit for BP. Tereso Newcomer PA-C  August 22, 2010 2:16 PM  Refills completed.  Left message on answering machine for pt. to return call.  Dutch Quint RN  August 22, 2010 2:36 PM  Notified pt. of refills.  Appt. 08/28/10.  Dutch Quint RN  August 22, 2010 3:46 PM     Prescriptions: CLONIDINE HCL 0.1 MG TABS (CLONIDINE HCL) take one tablet two times a day  #60 x 5   Entered by:   Dutch Quint RN   Authorized by:   Tereso Newcomer PA-C   Signed by:   Dutch Quint RN on 08/22/2010   Method used:   Electronically to        [facility] [phone]* (retail)       [address]       [place], [place]  [address]       Ph: [phone] or [phone]       Fax: [phone]   RxID:   [id number]

## 2010-12-30 NOTE — Progress Notes (Signed)
  Phone Note Call from Patient Call back at [facility] Phone [phone]   Summary of Call: The pt had been assigned with Lorin Picket because she previously was seen Dr Barbaraann Barthel and she came here to see Daphine Deutscher FNP for a cpp exam.  When I spoke with the pt, she expressed her interest to continue seen Daphine Deutscher FNP because she feels more confortable with a female provider.  Please let her know if that is possible. Initial call taken by: Manon Hilding,  February 14, 2010 9:16 AM  Follow-up for Phone Call        This provider's panel is closed to new patients but we would work her in for female issues when possible. Unfortunately at this time she due to scheduling restraints she must stay with Lorin Picket. Follow-up by: Mikey College CMA,  February 20, 2010 3:33 PM  Additional Follow-up for Phone Call Additional follow up Details #1::        Left a message to the pt to call back.Manon Hilding  February 27, 2010 12:21 PM  I spoke with the pt about the message above and she understood.Manon Hilding  March 03, 2010 2:20 PM

## 2010-12-30 NOTE — Miscellaneous (Signed)
Summary: Lec previsit  Clinical Lists Changes  Medications: Added new medication of MOVIPREP 100 GM  SOLR (PEG-KCL-NACL-NASULF-NA ASC-C) As per prep instructions. - Signed Rx of MOVIPREP 100 GM  SOLR (PEG-KCL-NACL-NASULF-NA ASC-C) As per prep instructions.;  #1 x 0;  Signed;  Entered by: Ulis Rias RN;  Authorized by: Meryl Dare MD Clementeen Graham;  Method used: Electronically to [id number]*, [address] [place], [place], [place]  [address], Ph: [phone] or [phone], Fax: [phone] Observations: Added new observation of ALLERGY REV: Done (03/04/2010 9:58)    Prescriptions: MOVIPREP 100 GM  SOLR (PEG-KCL-NACL-NASULF-NA ASC-C) As per prep instructions.  #1 x 0   Entered by:   Ulis Rias RN   Authorized by:   Meryl Dare MD [facility]   Signed by:   Ulis Rias RN on 03/04/2010   Method used:   Electronically to        [facility] [phone]* (retail)       [address]       [place], [place]  [address]       Ph: [phone] or [phone]       Fax: [phone]   RxID:   [id number]

## 2010-12-30 NOTE — Letter (Signed)
Summary: Lipid Letter  [facility]  [address] [place], [place] [address]   Phone: [phone]  Fax: [phone]    01/14/2010  Beatrice Lecher [address] [place], [place]  [address]  Dear Patsy Lager:  We have carefully reviewed your last lipid profile from 01/13/2010 and the results are noted below with a summary of recommendations for lipid management.    Cholesterol:       210     Goal: less than 200   HDL "good" Cholesterol:   55     Goal: greater than 40   LDL "bad" Cholesterol:   137     Goal: less than 100   Triglycerides:       90     Goal: less than 150  Labs done during recent office visit shows that your cholesterol was elevated. You should have been contacted by this office about the need to start pravastatin.  You can get labs rechecked 8 weeks after starting the medications.   Pap Smear results _____________________________.     Current Medications: 1)    Ibuprofen 800 Mg Tabs (Ibuprofen) .... Take one tablet by mouth as needed every six hours 2)    Diclofenac Sodium 75 Mg Tbec (Diclofenac sodium) .... Take one tablet by mouth bid  as needed(dr.hilts) 3)    Baclofen 10 Mg Tabs (Baclofen) .... Take one by mouth as neededdr.hilts) 4)    Multivitamins  Caps (Multiple vitamin) .... Take one tablet by mouth three times per week 5)    Pravastatin Sodium 10 Mg Tabs (Pravastatin sodium) .... One tablet by mouth nightly for cholesterol 6)    Hydrochlorothiazide 25 Mg Tabs (Hydrochlorothiazide) .Marland Kitchen.. 1 by mouth once daily 7)    Protonix 40 Mg Tbec (Pantoprazole sodium) .... Take 1 tablet by mouth two times a day 8)    Atenolol 25 Mg Tabs (Atenolol) .... One tablet by mouth nightly for blood pressure  If you have any questions, please call. We appreciate being able to work with you.   Sincerely,    [facility] Lehman Prom, FNP

## 2010-12-30 NOTE — Letter (Signed)
Summary: Handout Printed  Printed Handout:  - Diet - Low-Cholesterol Guidelines 

## 2010-12-30 NOTE — Letter (Signed)
Summary: *HSN Results Follow up  [facility]  [address] [place], [place] [address]   Phone: [phone]  Fax: [phone]      02/12/2010   [facility] [place] [address] [place], [place]  [address]   Dear  [name],                            ____S.Drinkard,FNP   ____D. Gore,FNP       ____B. McPherson,MD   ____V. Rankins,MD    ____E. Mulberry,MD    ____N. Daphine Deutscher, FNP  ____D. Reche Dixon, MD    ____K. Philipp Deputy, MD    __x__S. Alben Spittle, PA-C     This letter is to inform you that your recent test(s):  _______Pap Smear    ___x____Lab Test     _______X-ray    ___x____ is within acceptable limits  _______ requires a medication change  _______ requires a follow-up lab visit  _______ requires a follow-up visit with your provider   Comments:       _________________________________________________________ If you have any questions, please contact our office                     Sincerely,  Tereso Newcomer PA-C [facility]

## 2010-12-30 NOTE — Procedures (Signed)
Summary: Colonoscopy  Patient: Amy Jarvis Note: All result statuses are Final unless otherwise noted.  Tests: (1) Colonoscopy (COL)   COL Colonoscopy           DONE     [facility]     [address]     [place], [place]  [address]           COLONOSCOPY PROCEDURE REPORT           PATIENT:  Amy, Jarvis  MR#:  [id number]     BIRTHDATE:  24-May-1961, 48 yrs. old  GENDER:  female           ENDOSCOPIST:  Judie Petit T. Russella Dar, MD, [facility]     Referred by:  Tereso Newcomer, [facility]           PROCEDURE DATE:  03/18/2010     PROCEDURE:  Higher-risk screening colonoscopy G0105           ASA CLASS:  Class II     INDICATIONS:  1) Elevated Risk Screening, family history of colon     cancer-mother at 14.           MEDICATIONS:   Fentanyl 100 mcg IV, Versed 10 mg IV           DESCRIPTION OF PROCEDURE:   After the risks benefits and     alternatives of the procedure were thoroughly explained, informed     consent was obtained.  Digital rectal exam was performed and     revealed no abnormalities.   The LB [id number]-[id number] endoscope     was introduced through the anus and advanced to the cecum, which     was identified by both the appendix and ileocecal valve, without     limitations.  The quality of the prep was excellent, using     MoviPrep.  The instrument was then slowly withdrawn as the colon     was fully examined.     <<PROCEDUREIMAGES>>           FINDINGS:  Moderate diverticulosis was found in the sigmoid colon.     A normal appearing cecum, ileocecal valve, and appendiceal orifice     were identified. The ascending, hepatic flexure, transverse,     splenic flexure, descending, and rectum appeared unremarkable.     Retroflexed views in the rectum revealed no abnormalities. The     time to cecum =  5  minutes. The scope was then withdrawn (time =     9.25  min) from the patient and the procedure completed.           COMPLICATIONS:  None           ENDOSCOPIC IMPRESSION:  1) Moderate diverticulosis in the sigmoid colon           RECOMMENDATIONS:     1) High fiber diet with liberal fluid intake.     2) Repeat Colonoscopy in 5 years.           Venita Lick. Russella Dar, MD, Clementeen Graham           n.     eSIGNED:   Venita Lick. Aileena Iglesia at 03/18/2010 03:34 PM           Amy Jarvis, [id number]  Note: An exclamation mark (!) indicates a result that was not dispersed into the flowsheet. Document Creation Date: 03/18/2010 3:35 PM _______________________________________________________________________  (1) Order result status: Final Collection or observation date-time: 03/18/2010 15:31 Requested date-time:  Receipt  date-time:  Reported date-time:  Referring Physician:   Ordering Physician: Claudette Head [phone]) Specimen Source:  Source: Launa Grill Order Number: [id number] Lab site:   Appended Document: Colonoscopy    Clinical Lists Changes  Observations: Added new observation of COLONNXTDUE: 03/2015 (03/18/2010 16:17)

## 2010-12-30 NOTE — Assessment & Plan Note (Signed)
Summary: 4 WEEK BP CHECK////BC  Nurse Visit   Vital Signs:  Patient profile:   50 year old female Menstrual status:  postmenopausal Pulse rate:   50 / minute BP sitting:   150 / 89  (right arm) Cuff size:   large  Vitals Entered By: Gaylyn Cheers RN (February 11, 2010 11:31 AM) CC: taking hctz and atenolol denies chest pain, SOB, dizziness, bil ankle swelling, head itching, discussed with Wende Mott PA pt. instructed to stop Atenolol start Clonidine 0.1 one tablet twice a day, to rtc in 3-4 wks for BP check. Pt. would like to change providers, flag sent to Dr. Reche Dixon   Allergies: 1)  ! Promethazine Hcl (Promethazine Hcl) 2)  ! * Bp Pill 3)  ! Ace Inhibitors  Orders Added: 1)  Est. Patient Level I [[address]] 2)  T-Basic Metabolic Panel [[address]] Prescriptions: CLONIDINE HCL 0.1 MG TABS (CLONIDINE HCL) take one tablet two times a day  #60 x 1   Entered by:   Gaylyn Cheers RN   Authorized by:   Tereso Newcomer PA-C   Signed by:   Gaylyn Cheers RN on 02/11/2010   Method used:   Printed then faxed to ...         RxID:   [id number]

## 2011-01-01 ENCOUNTER — Telehealth (INDEPENDENT_AMBULATORY_CARE_PROVIDER_SITE_OTHER): Payer: Self-pay | Admitting: Nurse Practitioner

## 2011-01-01 NOTE — Progress Notes (Signed)
Summary: Possible yeast infection  Phone Note Call from Patient   Summary of Call: pt says she is getting a yeast infection because of the antibotics that she is taking sinus infection...Marland KitchenMarland Kitchen pt went to urgent care and they gave her the antibotic..... [facility]  [address] Initial call taken by: Armenia Shannon,  December 11, 2010 9:50 AM  Follow-up for Phone Call        Having vaginal itching, burning, starting to have white cottage-cheese discharge, some odor.  States she always gets a yeast infection when she takes antibiotics.  Would like Rx sent to pharmacy - advised she may need to come in for a wet prep - verbalized understanding.  Dutch Quint RN  December 11, 2010 2:12 PM   Additional Follow-up for Phone Call Additional follow up Details #1::        rx [facility] fluconazole [facility]     notify pt if symptoms persist she will need triage visit Additional Follow-up by: Lehman Prom FNP,  December 11, 2010 4:55 PM    Additional Follow-up for Phone Call Additional follow up Details #2::    Left message on answering machine for pt. to return call.  Dutch Quint RN  December 11, 2010 5:20 PM  Pt. notified of provider's response and new Rx.  Dutch Quint RN  December 12, 2010 9:04 AM   New/Updated Medications: FLUCONAZOLE 150 MG TABS (FLUCONAZOLE) ONe tablet by mouth x 1 dose Prescriptions: FLUCONAZOLE 150 MG TABS (FLUCONAZOLE) ONe tablet by mouth x 1 dose  #1 x 0   Entered and Authorized by:   Lehman Prom FNP   Signed by:   Lehman Prom FNP on 12/11/2010   Method used:   Electronically to        [facility] [phone]* (retail)       [address]       [place], [place]  [address]       Ph: [phone] or [phone]       Fax: [phone]   RxID:   [id number]

## 2011-01-01 NOTE — Progress Notes (Signed)
Summary: high Blood Pressure  Phone Note Call from Patient   Summary of Call: WENT TO URGENT CARE AND B/P WAS 225/102 THEY SAID SHE NEEDED TO SEE HER DR. BUT THERE IS  NO APPT FOT TODAY/ SHE WAS SUPPOSE TO HAVE BEEN CALLED  BACK TUES BUT NEVER DID GET A CALL BACK   [phone] Initial call taken by: Arta Bruce,  December 04, 2010 8:49 AM  Follow-up for Phone Call        appt. made for today for BP check Follow-up by: Gaylyn Cheers RN,  December 04, 2010 2:10 PM

## 2011-01-01 NOTE — Assessment & Plan Note (Signed)
Summary: High blood pressure//mm  Nurse Visit   Vital Signs:  Patient profile:   50 year old female Menstrual status:  postmenopausal BP sitting:   130 / 74  (right arm) Cuff size:   large  Vitals Entered By: Gaylyn Cheers RN (December 04, 2010 2:10 PM)  History of Present Illness: HA @ intervals, denies SOB, CP, does have ankle swelling but she said she has that all the time. Occasional nausea eating and drinking without problems. Seen @ Urgent Care on Tues. was told BP was 225/102. Currently taking HCTZ and Clonidine has not missed any doses. Had been taking OTC meds for cough and cold, no longer taking.   Patient Instructions: 1)  Continue taking medications as prescribed 2)  Keep a BP diary and bring to next visit 3)  Call office if BP gets high 4)  Exercise regularly 5)  Decrease salt in diet 6)  Drink plenty of water avoid soft drinks    Allergies (verified): 1)  ! Promethazine Hcl (Promethazine Hcl) 2)  ! * Bp Pill 3)  ! Ace Inhibitors  Orders Added: 1)  Est. Patient Level I [[address]]

## 2011-01-01 NOTE — Progress Notes (Signed)
Summary: cough, nasal D/C  Phone Note Call from Patient Call back at [facility] Phone [phone]   Summary of Call: pt states that she has a cold and has felt like this for tha last 5weeks. she spoke to a nurse previously and was told if not any better to call for an appt. unfortunately we do not have any available for patients so advised will forward information to nurse and she will contact her to any alternate suggestions or to see if she needs to be worked into the schedule.  Initial call taken by: Hassell Halim CMA,  December 02, 2010 8:57 AM  Follow-up for Phone Call        November 30 symptoms started and have gotten progressively worse. Currently Coughing expectorating lg amt. mucous, nasal D/C yellow, throat sore, denies fever, HA's daily, Sinus pressure, Ears feel stopped up, dry eyes. Has been using OTC cold and congestion medication and Benadryl with minimal relief. Appt. scheduled for tomorrow. Follow-up by: Gaylyn Cheers RN,  December 02, 2010 4:02 PM

## 2011-01-01 NOTE — Progress Notes (Signed)
Summary: Still has cough  Phone Note Call from Patient   Summary of Call: PT HAS HAD COUGH 3 WEEKS//HAD FEVER AT THE [facility] /STILL LOOSE COUGH//[facility] [address] ///[phone] Initial call taken by: Arta Bruce,  November 14, 2010 9:40 AM  Follow-up for Phone Call        Left message on voicemail for pt. to return call.  Dutch Quint RN  November 14, 2010 12:40 PM  Cough is productive of pale-colored mucus, was much darker, has gotten progressively lighter.  Denies dyspnea, fever.  Advised per home management per cold protocol, continue home humidification, avoid irritants such as heavy perfumes and room fresheners, drink plenty of fluids, take Mucinex plain to minimize congestion or [place] HBP for cough.  Advised that if cough persists for another week or longer, to call back. Follow-up by: Dutch Quint RN,  November 14, 2010 1:02 PM

## 2011-01-05 ENCOUNTER — Encounter (INDEPENDENT_AMBULATORY_CARE_PROVIDER_SITE_OTHER): Payer: Self-pay | Admitting: Nurse Practitioner

## 2011-01-15 NOTE — Progress Notes (Signed)
Summary: Persistent cough, drainage  Phone Note Call from Patient   Summary of Call: Still has cough, even after antibiotics from UC last month.  Productive yellow mucus, still having nasal congestion, draining yellow.  Denies fever, feels like there's something "crackling" in her throat/chest all the time.  Has used all home remedies offered via protocol, still having persistent cough/drainage. Initial call taken by: Dutch Quint RN,  January 01, 2011 11:17 AM  Follow-up for Phone Call        she had a nurse visit on 12/04/2010 and no c/o current symptoms. I'm assuming she went to UC since that time - [facility] notes reviewed (12/02/2010, given a zpack) she has a history of sleep apnea (is she wearing her machine) but not asthma. Does she smoke? Ok to schedule for a nurse triage visit - o2 sat, peak flow, auscultate lungs for assurance.  If truly with some objective warning signs will decide on course of action from there Follow-up by: Lehman Prom FNP,  January 01, 2011 3:14 PM  Additional Follow-up for Phone Call Additional follow up Details #1::        Left message on answer machine for pt. to return call. Gaylyn Cheers RN  January 02, 2011 3:50 PM  Pt. states has "Hard cough" expectorating lg amt. of light yellow mucous. Does not smoke using her CPAP. Advised drink plenty of fluids increase humidity, hot tea, stand in shower to loose secretions, may gargle with warm salt water if throat gets sore, may use OTC mucinex. Call Mon. for a triage nurse visit if no improvement.  Additional Follow-up by: Gaylyn Cheers RN,  January 02, 2011 5:29 PM    Additional Follow-up for Phone Call Additional follow up Details #2::    I called pt. she said she is feeling a little better. Wants to give Mucinex a try. Advised if continues or gets worse call office. Follow-up by: Gaylyn Cheers RN,  January 05, 2011 10:35 AM

## 2011-01-15 NOTE — Letter (Signed)
Summary: [employer]  [employer]   Imported By: Arta Bruce 01/05/2011 14:05:01  _____________________________________________________________________  External Attachment:    Type:   Image     Comment:   External Document

## 2011-01-19 ENCOUNTER — Telehealth (INDEPENDENT_AMBULATORY_CARE_PROVIDER_SITE_OTHER): Payer: Self-pay | Admitting: Nurse Practitioner

## 2011-01-20 ENCOUNTER — Encounter: Payer: Self-pay | Admitting: Nurse Practitioner

## 2011-01-20 ENCOUNTER — Telehealth (INDEPENDENT_AMBULATORY_CARE_PROVIDER_SITE_OTHER): Payer: Self-pay | Admitting: Nurse Practitioner

## 2011-01-20 ENCOUNTER — Encounter (INDEPENDENT_AMBULATORY_CARE_PROVIDER_SITE_OTHER): Payer: Self-pay | Admitting: Nurse Practitioner

## 2011-01-20 DIAGNOSIS — J019 Acute sinusitis, unspecified: Secondary | ICD-10-CM

## 2011-01-20 HISTORY — DX: Acute sinusitis, unspecified: J01.90

## 2011-01-27 NOTE — Progress Notes (Signed)
Summary: COUGH & SINUS INFECTION   Phone Note Call from Patient   Caller: Patient Complaint: Cough/Sore throat Summary of Call: PT  FNP MARTIN  WANTS TO BE  SEEN FOR SINUS INFECTION & COUGH  BUT IS NOT APPT AVAILABALE UNTIL NEXT . Fuller Song HER @ [phone] Initial call taken by: Cheryll Dessert,  January 19, 2011 4:43 PM  Follow-up for Phone Call        Coughing productive of yellowish mucus since November and might have developed another sinus infection.  Aching teeth, headache down face, forehead.  Postnasal drainage, green nasal drainage.  Dizzy now and then, lightheaded and nauseated.  Occasional fever, takes ibuprofen for it.  Throat is sore on and off.   Ear pain is sporadic as well, right ear.  Advised no provider appointments available -- triage scheduled 01/20/11. Follow-up by: Dutch Quint RN,  January 19, 2011 5:46 PM  Additional Follow-up for Phone Call Additional follow up Details #1::        In office.  Dutch Quint RN  January 20, 2011 3:52 PM

## 2011-01-27 NOTE — Assessment & Plan Note (Signed)
Summary: Persistent cough, possible sinus infection  Nurse Visit   Vital Signs:  Patient profile:   50 year old female Menstrual status:  postmenopausal Weight:      312.8 pounds O2 Sat:      95 % on Room air Temp:     97.3 degrees F oral Pulse rate:   88 / minute Pulse rhythm:   regular Resp:     28 per minute BP sitting:   140 / 84  (left arm) Cuff size:   large  Vitals Entered By: Dutch Quint RN (January 20, 2011 3:18 PM)  O2 Flow:  Room air  Impression & Recommendations:  Problem # 1:  ACUTE SINUSITIS, UNSPECIFIED (ICD-461.9) Sinus pain, green drainage, right ear fluid Start ABT Comfort measures Call back if symptoms persist or worsen   Her updated medication list for this problem includes:    Azithromycin 250 Mg Tabs (Azithromycin) .Marland Kitchen..Marland Kitchen Two tabs by mouth the first day, one tab daily for the next 4 days  Complete Medication List: 1)  Ibuprofen 800 Mg Tabs (Ibuprofen) .... Take one tablet by mouth as needed every six hours 2)  Multivitamins Caps (Multiple vitamin) .... Take one tablet by mouth three times per week 3)  Pravastatin Sodium 10 Mg Tabs (Pravastatin sodium) .... Hold 4)  Hydrochlorothiazide 25 Mg Tabs (Hydrochlorothiazide) .Marland Kitchen.. 1 by mouth once daily 5)  Clonidine Hcl 0.1 Mg Tabs (Clonidine hcl) .... Take one tablet two times a day 6)  Furosemide 20 Mg Tabs (Furosemide) .... One tablet by mouth daily as needed for fluid 7)  Fluconazole 150 Mg Tabs (Fluconazole) .... One tablet by mouth x 1 dose 8)  Nexium 40 Mg Cpdr (Esomeprazole magnesium) .... One tablet by mouth daily before breakfast for stomach 9)  Azithromycin 250 Mg Tabs (Azithromycin) .... Two tabs by mouth the first day, one tab daily for the next 4 days 10)  Fluconazole 150 Mg Tabs (Fluconazole) .... One tablet x 1 dose   Patient Instructions: 1)  Reviewed with Dr. Delrae Alfred 2)  You have a sinus infection 3)  Take azithromycin 250 mg. 2 tabs by mouth the first day, then one tab daily for the  next 4 days. 4)  Use saline nasal spray to moisten nasal passages and ease with congestion. 5)  Stand in warm shower to loosen congestion. 6)  Humidify home to moisten the air -- used cool mist vaporized or pans filled with water. 7)  Continue to use ibuprofen or alleve, or Tylenol for pain and fever. 8)  Call back if symptoms worsen or persist.   Serial Vital Signs/Assessments:                                PEF    PreRx  PostRx Time      O2 Sat  O2 Type     L/min  L/min  L/min   By 3:25 PM   95  %   Room air                          Dutch Quint RN   CC:  Persistent cough and possible sinus infection.  History of Present Illness: Coughing productive of yellowish mucus since November, also thinks she might have another sinus infection.  Called yesterday to see provider, here for triage visit.   Review of Systems       Productive cough,  aching teeth, headache down face, forehead.  Green nasal drainage for two weeks.  Dizzy now and then, lightheaded and nauseated.  Occasional fever, has been taking ibuprofen for pain and fever.  Throat is sore on and off.    ENT:  Complains of earache, nasal congestion, postnasal drainage, sinus pressure, and sore throat; Hurts when she pulls on right ear lobe, general right ear pain is sporadic.  No change in hearing, ear feels "full.".   Physical Exam  General:  alert, well-developed, well-nourished, well-hydrated, and overweight-appearing.   Ears:  R TM bulging slightly, fluid.  Both inner ears without redness, L TM normal Nose:  no mucosal edema.   Mouth:  No inflammation noted Lungs:  normal respiratory effort, no accessory muscle use, normal breath sounds, no crackles, and no wheezes.    CC: Persistent cough, possible sinus infection Is Patient Diabetic? No Pain Assessment Patient in pain? yes     Location: mid forehead, back Intensity: 7 Type: sharp Onset of pain  sinus pain started a couple of weeks ago, back pain is chronic  Does  patient need assistance? Functional Status Self care Ambulation Normal   Allergies: 1)  ! Promethazine Hcl (Promethazine Hcl) 2)  ! * Bp Pill 3)  ! Ace Inhibitors  Orders Added: 1)  Est. Patient Level II [45409] Prescriptions: FLUCONAZOLE 150 MG TABS (FLUCONAZOLE) One tablet x 1 dose  #1 x 0   Entered and Authorized by:   Lehman Prom FNP   Signed by:   Lehman Prom FNP on 01/20/2011   Method used:   Electronically to        [facility] [phone]* (retail)       [address]       [place], [place]  [address]       Ph: [phone] or [phone]       Fax: [phone]   RxID:   [id number] AZITHROMYCIN 250 MG TABS (AZITHROMYCIN) Two tabs by mouth the first day, one tab daily for the next 4 days  #6 x 0   Entered by:   Dutch Quint RN   Authorized by:   Julieanne Manson MD   Signed by:   Dutch Quint RN on 01/20/2011   Method used:   Electronically to        [facility] [phone]* (retail)       [address]       [place], [place]  [address]       Ph: [phone] or [phone]       Fax: [phone]   RxID:   [id number]

## 2011-02-05 NOTE — Progress Notes (Signed)
Summary: Wants med for yeast due to ABT  Phone Note Outgoing Call   Summary of Call: Pt. was just in office for sinus infection, given Rx for azithromycin.  Would like dose of fluconazole, since she says she always gets yeast infection after antibiotics.  Uses [facility] on [address]. Initial call taken by: Dutch Quint RN,  January 20, 2011 3:51 PM  Follow-up for Phone Call        It is not with ALL antibiotics that people get a yeast infection, just with broad spectrum so be sure that pt knows this. Azithromycin is however broad spectrum so i will honor her request but please educate pt to above so she doesn't think all have this effect Follow-up by: Lehman Prom FNP,  January 20, 2011 4:10 PM  Additional Follow-up for Phone Call Additional follow up Details #1::        Left message on answering machine for pt. to return call.  Dutch Quint RN  January 20, 2011 4:34 PM     Additional Follow-up for Phone Call Additional follow up Details #2::    Pt. notified, understands info regarding broad spectrum antibiotics. Gaylyn Cheers RN  January 27, 2011 12:55 PM

## 2011-03-04 LAB — URINALYSIS, ROUTINE W REFLEX MICROSCOPIC
Glucose, UA: NEGATIVE mg/dL
Nitrite: NEGATIVE
Specific Gravity, Urine: 1.017 (ref 1.005–1.030)
Urobilinogen, UA: 0.2 mg/dL (ref 0.0–1.0)
pH: 5 (ref 5.0–8.0)

## 2011-03-04 LAB — APTT: aPTT: 33 seconds (ref 24–37)

## 2011-03-04 LAB — COMPREHENSIVE METABOLIC PANEL
GFR calc Af Amer: >60 mL/min (ref >60)
GFR calc non Af Amer: >60 mL/min (ref >60)
Potassium: 3.5 mEq/L (ref 3.5–5.1)

## 2011-03-04 LAB — CBC
HCT: 36.8 % (ref 36.0–46.0)
Hemoglobin: 12.1 g/dL (ref 12.0–15.0)
MCHC: 33.1 g/dL (ref 30.0–36.0)
RDW: 15.5 % (ref 11.5–15.5)

## 2011-03-04 LAB — PROTIME-INR: INR: 0.99 (ref 0.00–1.49)

## 2011-03-04 LAB — DIFFERENTIAL
Eosinophils Absolute: 0.5 10*3/uL (ref 0.0–0.7)
Eosinophils Relative: 6 % — ABNORMAL HIGH (ref 0–5)
Lymphocytes Relative: 28 % (ref 12–46)
Lymphs Abs: 2.2 10*3/uL (ref 0.7–4.0)
Monocytes Absolute: 0.5 10*3/uL (ref 0.1–1.0)
Neutro Abs: 4.7 10*3/uL (ref 1.7–7.7)
Neutrophils Relative %: 59 % (ref 43–77)

## 2011-03-05 LAB — URINALYSIS, ROUTINE W REFLEX MICROSCOPIC
Ketones, ur: 15 mg/dL — AB
Nitrite: NEGATIVE
Protein, ur: NEGATIVE mg/dL
Urobilinogen, UA: 0.2 mg/dL (ref 0.0–1.0)

## 2011-03-05 LAB — POCT CARDIAC MARKERS
CKMB, poc: 2.3 ng/mL (ref 1.0–8.0)
Myoglobin, poc: 103 ng/mL (ref 12–200)
Troponin i, poc: <0.05 ng/mL (ref 0.00–0.09)

## 2011-03-05 LAB — HEPATIC FUNCTION PANEL
Albumin: 4 g/dL (ref 3.5–5.2)
Alkaline Phosphatase: 88 U/L (ref 39–117)
Bilirubin, Direct: 0.1 mg/dL (ref 0.0–0.3)
Total Bilirubin: 1 mg/dL (ref 0.3–1.2)
Total Protein: 7.3 g/dL (ref 6.0–8.3)

## 2011-03-05 LAB — POCT I-STAT, CHEM 8
Calcium, Ion: 1.14 mmol/L (ref 1.12–1.32)
Creatinine, Ser: 1.1 mg/dL (ref 0.4–1.2)
HCT: 41 % (ref 36.0–46.0)
Hemoglobin: 13.9 g/dL (ref 12.0–15.0)
TCO2: 28 mmol/L (ref 0–100)

## 2011-04-17 NOTE — Op Note (Signed)
[place]  Patient:    Amy Jarvis, Amy Jarvis                    MRN: [id number] Proc. Date: 05/18/00 Adm. Date:  [id number] Disc. Date: [id number] Attending:  Marcene Corning                           Operative Report  PREOPERATIVE DIAGNOSIS:  Left shoulder impingement.  POSTOPERATIVE DIAGNOSES: 1. Left shoulder impingement. 2. Left shoulder partial rotator cuff tear.  PROCEDURES: 1. Left shoulder arthroscopic acromioplasty. 2. Left shoulder arthroscopic debridement.  SURGEON:  Lubertha Basque. Jerl Santos, M.D.  ASSISTANT:  Prince Rome, P.A.  ANESTHESIA:  General and block.  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  This has persisted despite subacromial injection on several occasions, physical therapy, oral anti-inflammatories, and activity restriction.  At this point, she is offered arthroscopic intervention to consist of acromioplasty with possible debridement.  The procedures were discussed with the patient and informed operative consent was obtained after discussion of the possible complications of, reaction to anesthesia, and infection.  DESCRIPTION OF PROCEDURE:  The patient was taken to the operating suite where general anesthetic was supplied without difficulty.  She was given a preoperative interscalene block as well.  She was positioned in the beach chair position and prepped and draped in normal sterile fashion.  After administration of preoperative IV antibiotics, an arthroscopy of the left shoulder was performed through a total of three portals.  The glenohumeral joint showed no degenerative change and labral structures were all well attached, including the biceps anchor.  The rotator cuff examined on its undersurface had some tearing at the greater tuberosity attachment.  This was addressed with debridement through the additional third  portal.  Once this was done, the cuff did appear to be well attached to the greater tuberosity.  The subacromial space was then entered.  Some soft tissue was removed from the undersurface of the acromion and the coracoacromial ligament was released.  An arthroscopic subacromial decompression was done with the bur in the lateral position followed by transfer of the bur to the posterior position.  The cuff was then examined from above and no tear was seen.  The AP joint was examined from below and appeared benign and was not addressed.  The undersurface of the distal clavicle also did not appear to be impinging on the rotator cuff.  The shoulder was thoroughly irrigated at the end of the case followed by placement of epinephrine.  Simple sutures of nylon were used to reapproximate the portals loosely followed by Adaptic and a dry gauze dressing with tape.  The estimated blood loss and intraoperative fluids can be obtained from the anesthesia records.  DISPOSITION:  The patient was extubated in the operating room and taken to the recovery room in stable condition.  Plans were for her to go home the same day and follow up in the office in less than a week.  I will contact her by phone tonight. DD:  05/18/00 TD:  05/20/00 Job: [id number]

## 2011-05-18 ENCOUNTER — Other Ambulatory Visit (HOSPITAL_COMMUNITY): Payer: Self-pay | Admitting: Family Medicine

## 2011-05-18 DIAGNOSIS — Z1231 Encounter for screening mammogram for malignant neoplasm of breast: Secondary | ICD-10-CM

## 2011-06-01 ENCOUNTER — Ambulatory Visit
Admission: RE | Admit: 2011-06-01 | Discharge: 2011-06-01 | Disposition: A | Discharge status: Home / Self Care | Payer: Medicare Other | Source: Ambulatory Visit | Attending: Family Medicine | Admitting: Family Medicine

## 2011-06-01 DIAGNOSIS — Z1231 Encounter for screening mammogram for malignant neoplasm of breast: Secondary | ICD-10-CM

## 2012-05-20 DIAGNOSIS — I10 Essential (primary) hypertension: Secondary | ICD-10-CM | POA: Diagnosis not present

## 2012-06-21 DIAGNOSIS — I10 Essential (primary) hypertension: Secondary | ICD-10-CM | POA: Diagnosis not present

## 2012-10-04 ENCOUNTER — Encounter: Payer: Medicare Other | Admitting: Internal Medicine

## 2012-10-07 ENCOUNTER — Encounter: Payer: Self-pay | Admitting: Internal Medicine

## 2012-10-07 ENCOUNTER — Ambulatory Visit (INDEPENDENT_AMBULATORY_CARE_PROVIDER_SITE_OTHER): Payer: Medicare Other | Admitting: Internal Medicine

## 2012-10-07 VITALS — BP 157/105 | HR 92 | Temp 97.2°F | Ht 68.0 in | Wt 330.5 lb

## 2012-10-07 DIAGNOSIS — G4733 Obstructive sleep apnea (adult) (pediatric): Secondary | ICD-10-CM | POA: Diagnosis not present

## 2012-10-07 DIAGNOSIS — I10 Essential (primary) hypertension: Secondary | ICD-10-CM | POA: Diagnosis not present

## 2012-10-07 DIAGNOSIS — K219 Gastro-esophageal reflux disease without esophagitis: Secondary | ICD-10-CM | POA: Diagnosis not present

## 2012-10-07 DIAGNOSIS — G894 Chronic pain syndrome: Secondary | ICD-10-CM | POA: Insufficient documentation

## 2012-10-07 DIAGNOSIS — N951 Menopausal and female climacteric states: Secondary | ICD-10-CM | POA: Diagnosis not present

## 2012-10-07 DIAGNOSIS — E669 Obesity, unspecified: Secondary | ICD-10-CM | POA: Diagnosis not present

## 2012-10-07 DIAGNOSIS — Z0000 Encounter for general adult medical examination without abnormal findings: Secondary | ICD-10-CM | POA: Insufficient documentation

## 2012-10-07 DIAGNOSIS — E785 Hyperlipidemia, unspecified: Secondary | ICD-10-CM | POA: Diagnosis not present

## 2012-10-07 DIAGNOSIS — Z23 Encounter for immunization: Secondary | ICD-10-CM

## 2012-10-07 DIAGNOSIS — R42 Dizziness and giddiness: Secondary | ICD-10-CM

## 2012-10-07 DIAGNOSIS — IMO0002 Reserved for concepts with insufficient information to code with codable children: Secondary | ICD-10-CM

## 2012-10-07 HISTORY — DX: Reserved for concepts with insufficient information to code with codable children: IMO0002

## 2012-10-07 HISTORY — DX: Dizziness and giddiness: R42

## 2012-10-07 MED ORDER — HYDROCHLOROTHIAZIDE 25 MG PO TABS: 25.0000 mg | ORAL_TABLET | Freq: Every day | ORAL | Status: DC

## 2012-10-07 NOTE — Assessment & Plan Note (Signed)
History is consistent with benign paroxysmal positional vertigo.  We will refer to PT for vestibular maneuvers. - continue meclizine PRN - PT referral

## 2012-10-07 NOTE — Assessment & Plan Note (Addendum)
Pap, mammo, Tdap, and colonoscopy are up to date. Flu vaccine given today.  Fasting BMET today will serve as diabetes screening.

## 2012-10-07 NOTE — Progress Notes (Signed)
Subjective:    Patient ID: Amy Jarvis, female    DOB: 04/16/1961, 51 y.o.   MRN: [id number]  CC: establish care  HPI:  This is a 51 year old woman with hypertension, hyperlipidemia, chronic back pain, obesity, and sleep apnea who is presenting today without specific complaint to establish care with [name].  On review of systems, she reports vertigo, orthopnea, and vaginal spotting. Vertigo Chronic problem for at least a year. Unknown provocation. Described as room spinning. Timing is about once per week. Patient takes meclizine PRN for this with good results. Has never tried neuro PT or vestibular maneuvers. Associated symptom is mild, momentary tinnitus. Vaginal spotting Patient occasionally notices blood spotting. She is perimenopausal, experiencing hot flashes and very irregular menstrual periods.  She has had a period recently. Chronic back pain Patient has MR demonstrated spinal stenosis and degenerative disk disease.  Takes 500mg  acetaminophen qHS and 800mg  ibuprofen tablet once daily.   Past Medical History  Diagnosis Date  . Hyperlipidemia LDL goal < 130 09/25/2008  . Morbid obesity 08/28/2010  . Obstructive sleep apnea on CPAP 06/20/2010  . Hypertension 09/25/2008  . GERD 09/05/2009  . Diverticulosis 03/26/2010  . Microscopic hematuria 01/13/2010  . Cystocele 01/13/2010  . Uterine prolapse 11/19/2008  . Perimenopausal 01/13/2010  . Vertigo 10/07/2012  . Degenerative disk disease 10/07/2012    Past Surgical History  Procedure Date  . Cholecystectomy 2010  . Rotator cuff repair     left  . Tubal ligation 1992    Current Outpatient Rx  Name  Route  Sig  Dispense  Refill  . ACETAMINOPHEN 500 MG PO TABS   Oral   Take 500 mg by mouth at bedtime.         Marland Kitchen ESOMEPRAZOLE MAGNESIUM 40 MG PO CPDR   Oral   Take 40 mg by mouth daily before breakfast.         . OMEGA-3 FATTY ACIDS 1000 MG PO CAPS   Oral   Take 2 g by mouth daily.         Marland Kitchen HYDROCHLOROTHIAZIDE 25 MG PO  TABS   Oral   Take 1 tablet (25 mg total) by mouth daily.   30 tablet   11   . IBUPROFEN 800 MG PO TABS   Oral   Take 800 mg by mouth every 6 (six) hours as needed.         Marland Kitchen LOSARTAN POTASSIUM 50 MG PO TABS   Oral   Take 50 mg by mouth daily.         Marland Kitchen MECLIZINE HCL 25 MG PO TABS   Oral   Take 25 mg by mouth 3 (three) times daily as needed.         . ADULT MULTIVITAMIN W/MINERALS CH   Oral   Take 1 tablet by mouth every other day.         Marland Kitchen POTASSIUM CHLORIDE ER PO   Oral   Take 20 mEq by mouth 3 (three) times daily as needed.           Allergies  Allergen Reactions  . Ace Inhibitors     REACTION: itching and swelling  . Promethazine Hcl     REACTION: edema    Family History  Problem Relation Age of Onset  . Colon cancer Mother 33  . Hypertension Mother   . Stroke Mother 64  . Breast cancer Other     maternal great-aunt    Family Status  Relation Status Death Age  . Father Other     unknown history  . Mother Alive   . Son Alive   . Son Alive   . Daughter Alive     OB History    Grav Para Term Preterm Abortions TAB SAB Ect Mult Living   3 3 3  0 0 0 0 0 0 3       Social History  . Marital Status: Divorced    Number of Children: 3    Social History Main Topics  . Smoking status: Never Smoker   . Smokeless tobacco: Never Used  . Alcohol Use: No  . Drug Use: No  . Sexually Active: Not Currently    Review of Systems  Constitutional: Negative for fever, chills, fatigue and unexpected weight change.  HENT: Positive for tinnitus. Negative for hearing loss, congestion, sore throat, rhinorrhea and sinus pressure.   Eyes: Positive for visual disturbance (blurred near vision).  Respiratory: Negative for cough and shortness of breath.   Cardiovascular: Positive for leg swelling. Negative for chest pain and palpitations.  Gastrointestinal: Negative for nausea, vomiting, abdominal pain, diarrhea, constipation and blood in stool.    Genitourinary: Positive for menstrual problem. Negative for dysuria, hematuria and difficulty urinating.  Musculoskeletal: Positive for back pain. Negative for arthralgias.  Skin: Negative for rash.  Neurological: Positive for dizziness (vertigo). Negative for headaches.       Objective:   Physical Exam GENERAL: obese; no acute distress HEAD: atraumatic, normocephalic EYES: pupils equal, round and reactive; sclera anicteric; normal conjunctiva EARS: canals and TMs normal bilaterally NOSE/THROAT: oropharynx clear, moist mucous membranes NECK: supple, no carotid bruits, thyroid normal in size and without palpable nodules LYMPH: no cervical or supraclavicular lymphadenopathy LUNGS: clear to auscultation bilaterally, normal work of breathing HEART: normal rate and regular rhythm; normal S1 and S2 without S3 or S4; no murmurs, rubs, or clicks ABDOMEN: soft, non-tender, normal bowel sounds EXTREMITIES: 1+ pitting edema, finger nails are normal, no clubbing PULSES: 2+ and symmetric radial pulses CRANIAL NERVES: pupils reactive to light bilaterally; extra occular muscles are intact; masseter and temporalis function is intact; smile is symmetric; hearing is equal bilaterally, tested with a tuning fork; uvula is midline and palate elevates symmetrically; tongue protrudes midline. SKIN: warm, dry, intact, dry skin changes consistent with eczema on anterior left lower leg  Filed Vitals:   10/07/12 1551  BP: 157/105  Pulse: 92  Temp: 97.2 F (36.2 C)              Assessment & Plan:

## 2012-10-07 NOTE — Assessment & Plan Note (Signed)
Patient is not yet postmenopausal. She still has occasional menstrual periods. If "spotting" continues in the absence of continued menstruation, referral to gynecology needed.

## 2012-10-07 NOTE — Assessment & Plan Note (Addendum)
Hypertensive today. Takes hydrochlorothiazide 25mg  daily and losartan 50mg  daily for this. Instead of escalating pharmacotherapy, extended counseling provided regarding weight loss through diet and exercise was given. We will recheck in 3 months and hope to avoid additional pharmacotherapy.  We will check a BMET today in light of therapy with thiazide diuretic.  We will also check a urine microalbumin. - continue hydrochlorothiazide 25mg  daily (refilled today) - continue losartan 50mg  daily - loose weight - BMET and urine albumin:creatinine ratio today  ADDENDUM: Renal function is still normal but moderate albuminuria is now present.  We will need to repeat this in 3 months to confirm persistent proteinuria.  She is already on an ARB. Microalbumin:creatinine 108.0 (H)  BMET Component Value Date/Time   NA 139 10/07/2012 1653   K 3.1* 10/07/2012 1653   CL 97 10/07/2012 1653   CO2 30 10/07/2012 1653   GLUCOSE 85 10/07/2012 1653   BUN 14 10/07/2012 1653   CREATININE 0.68 10/07/2012 1653   CALCIUM 9.8 10/07/2012 1653

## 2012-10-07 NOTE — Assessment & Plan Note (Addendum)
Last lipid panel was from 2011.  At that time it showed: TC 210, TGY 90, HDL 55, and LDL 137.  She takes OTC fish oil tablets at home. She says she cannot tolerate statins secondary to arthralgias/myalgias. She is fasting today.  We will repeat FLP. LDL goal is < 130 based on [place] data. - fasting lipid panel today - consider more formal cholesterol lowering regimen  ADDENDUM:  LDL is now 181, well above her goal of 130. This will be hard to adequately treat without a statin. Lipid Panel     Component Value Date/Time   CHOL 255* 10/07/2012 1653   TRIG 127 10/07/2012 1653   HDL 49 10/07/2012 1653   CHOLHDL 5.2 10/07/2012 1653   VLDL 25 10/07/2012 1653   LDLCALC 181* 10/07/2012 1653

## 2012-10-07 NOTE — Assessment & Plan Note (Signed)
Stable. Continue current treatment. - continue esomeprazole 40mg  daily

## 2012-10-07 NOTE — Patient Instructions (Addendum)
We will make a referral to physical therapy to evaluate and treat your vertigo.  With weight loss, we may be able to get your blood pressure under control without additional medications.  We will see you back in 3 months to recheck your blood pressure.     WEIGHT REDUCTION:  Strategies: A healthy weight loss program includes:  A calorie restricted diet based on individual calorie needs.   Increased physical activity (exercise).  An exercise program is just as important as the right low-calorie diet.    An unhealthy weight loss program includes:  Fasting.   Fad diets.   Supplements and drugs.  These choices do not succeed in long-term weight control.   Home Care Instructions: To help you make the needed dietary changes:   Exercise and perform physical activity as directed by your caregiver.   Keep a daily record of everything you eat. There are many free websites to help you with this. It may be helpful to measure your foods so you can determine if you are eating the correct portion sizes.   Use low-calorie cookbooks or take special cooking classes.   Avoid alcohol. Drink more water and drinks with no calories.   Take vitamins and supplements only as recommended by your caregiver.   Weight loss support groups, Registered Dieticians, counselors, and stress reduction education can also be very helpful.   ________________________________________________________________________  DASH DIET:  The DASH diet stands for "Dietary Approaches to Stop Hypertension." It is a healthy eating plan that has been shown to reduce high blood pressure (hypertension) in as little as 14 days, while also possibly providing other significant health benefits. These other health benefits include reducing the risk of breast cancer after menopause and reducing the risk of type 2 diabetes, heart disease, colon cancer, and stroke. Health benefits also include weight loss and slowing kidney failure in  patients with chronic kidney disease.   Diet guidelines: Limit salt (sodium). Your diet should contain less than 1500 mg of sodium daily.  Limit refined or processed carbohydrates. Your diet should include mostly whole grains. Desserts and added sugars should be used sparingly.  Include small amounts of heart-healthy fats. These types of fats include nuts, oils, and tub margarine. Limit saturated and trans fats. These fats have been shown to be harmful in the body.   Choosing Foods: The following food groups are based on a 2000 calorie diet. See your Registered Dietitian for individual calorie needs.  Grains and Grain Products (6 to 8 servings daily)  Eat More Often: Whole-wheat bread, brown rice, whole-grain or wheat pasta, quinoa, popcorn without added fat or salt (air popped).  Eat Less Often: White bread, white pasta, white rice, cornbread.  Vegetables (4 to 5 servings daily)  Eat More Often: Fresh, frozen, and canned vegetables. Vegetables may be raw, steamed, roasted, or grilled with a minimal amount of fat.  Eat Less Often/Avoid: Creamed or fried vegetables. Vegetables in a cheese sauce.  Fruit (4 to 5 servings daily)  Eat More Often: All fresh, canned (in natural juice), or frozen fruits. Dried fruits without added sugar. One hundred percent fruit juice ( cup [237 mL] daily).  Eat Less Often: Dried fruits with added sugar. Canned fruit in light or heavy syrup.  [employer], Fish, and Poultry (2 servings or less daily. One serving is 3 to 4 oz [85-114 g]).  Eat More Often: Ninety percent or leaner ground beef, tenderloin, sirloin. Round cuts of beef, chicken breast, [place] breast. All fish.  Grill, bake, or broil your meat. Nothing should be fried.  Eat Less Often/Avoid: Fatty cuts of meat, [place], or chicken leg, thigh, or wing. Fried cuts of meat or fish.  Dairy (2 to 3 servings)  Eat More Often: Low-fat or fat-free milk, low-fat plain or light yogurt, reduced-fat or part-skim cheese.    Eat Less Often/Avoid: Milk (whole, 2%, skim, or chocolate). Whole milk yogurt. Full-fat cheeses.  Nuts, Seeds, and Legumes (4 to 5 servings per week)  Eat More Often: All without added salt.  Eat Less Often/Avoid: Salted nuts and seeds, canned beans with added salt.  Fats and Sweets (limited)  Eat More Often: Vegetable oils, tub margarines without trans fats, sugar-free gelatin. Mayonnaise and salad dressings.  Eat Less Often/Avoid: Coconut oils, palm oils, butter, stick margarine, cream, half and half, cookies, candy, pie.   ________________________________________________________________________  Smoking Cessation Tips 1-800-QUIT-NOW  This document explains the best ways for you to quit smoking and new treatments to help. It lists new medicines that can double or triple your chances of quitting and quitting for good. It also considers ways to avoid relapses and concerns you may have about quitting, including weight gain.   Nicotine: A Powerful Addiction If you have tried to quit smoking, you know how hard it can be. It is hard because nicotine is a very addictive drug. For some people, it can be as addictive as heroin or cocaine. Usually, people make 2 or 3 tries, or more, before finally being able to quit. Each time you try to quit, you can learn about what helps and what hurts. Quitting takes hard work and a lot of effort, but you can quit smoking.   Quitting smoking is one of the most important things you will ever do You will live longer, feel better, and live better.  The impact on your body of quitting smoking is felt almost immediately:   Five keys to quitting: Studies have shown that these 5 steps will help you quit smoking and quit for good. You have the best chances of quitting if you use them together:   1. GET READY  Set a quit date.  Change your environment.  Get rid of ALL cigarettes, ashtrays, matches, and lighters in your home, car, and place of work.  Do not let  people smoke in your home.  Review your past attempts to quit. Think about what worked and what did not.  Once you quit, do not smoke. NOT EVEN A PUFF!   2. GET SUPPORT AND ENCOURAGEMENT  Tell your family, friends, and coworkers that you are going to quit and need their support. Ask them not to smoke around you.  Get individual, group, or telephone counseling and support.  Many smokers find one or more of the many self-help books available useful in helping them quit and stay off tobacco.   3. LEARN NEW SKILLS AND BEHAVIORS  Try to distract yourself from urges to smoke. Talk to someone, go for a walk, or occupy your time with a task.  When you first try to quit, change your routine. Take a different route to work. Drink tea instead of coffee. Eat breakfast in a different place.  Do something to reduce your stress. Take a hot bath, exercise, or read a book.  Plan something enjoyable to do every day. Reward yourself for not smoking.  Explore interactive web-based programs that specialize in helping you quit.   4. GET MEDICINE AND USE IT CORRECTLY .  Medicines can  help you stop smoking and decrease the urge to smoke. Combining medicine with the above behavioral methods and support can quadruple your chances of successfully quitting smoking.  Talk with your doctor about these options.  5. BE PREPARED FOR RELAPSE OR DIFFICULT SITUATIONS  Most relapses occur within the first 3 months after quitting. Do not be discouraged if you start smoking again. Remember, most people try several times before they finally quit.  You may have symptoms of withdrawal because your body is used to nicotine. You may crave cigarettes, be irritable, feel very hungry, cough often, get headaches, or have difficulty concentrating.  The withdrawal symptoms are only temporary. They are strongest when you first quit, but they will go away within 10 to 14 days.   Quitting takes hard work and a lot of effort, but you can quit  smoking.   FOR MORE INFORMATION  Smokefree.gov (http://www.davis-sullivan.com/) provides free, accurate, evidence-based information and professional assistance to help support the immediate and long-term needs of people trying to quit smoking.  Document Released: 11/10/2001 Document Re-Released: 05/06/2010  [facility] Patient Information 2011 [place], [place].

## 2012-10-07 NOTE — Assessment & Plan Note (Signed)
Stable. Uses CPAP most nights. Counseled patient on the consequences of untreated sleep apnea and the importance of CPAP.

## 2012-10-07 NOTE — Assessment & Plan Note (Signed)
Body mass index is 50.25 kg/(m^2).  Extended counseling on proper foods, portion control, and exercise targeted at weight loss was provided.  Consider referral to [facility] in the future.

## 2012-10-08 LAB — LIPID PANEL
HDL: 49 mg/dL (ref >39)
LDL Cholesterol: 181 mg/dL — ABNORMAL HIGH (ref 0–99)

## 2012-10-08 LAB — BASIC METABOLIC PANEL
CO2: 30 mEq/L (ref 19–32)
Calcium: 9.8 mg/dL (ref 8.4–10.5)
Chloride: 97 mEq/L (ref 96–112)
Potassium: 3.1 mEq/L — ABNORMAL LOW (ref 3.5–5.3)
Sodium: 139 mEq/L (ref 135–145)

## 2012-10-08 LAB — MICROALBUMIN / CREATININE URINE RATIO: Microalb Creat Ratio: 108 mg/g — ABNORMAL HIGH (ref 0.0–30.0)

## 2012-10-10 ENCOUNTER — Encounter: Payer: Self-pay | Admitting: Internal Medicine

## 2012-10-19 ENCOUNTER — Ambulatory Visit (INDEPENDENT_AMBULATORY_CARE_PROVIDER_SITE_OTHER): Payer: Medicare Other | Admitting: Internal Medicine

## 2012-10-19 ENCOUNTER — Encounter: Payer: Self-pay | Admitting: Internal Medicine

## 2012-10-19 VITALS — BP 132/71 | HR 99 | Temp 98.0°F | Ht 68.0 in | Wt 329.9 lb

## 2012-10-19 DIAGNOSIS — I10 Essential (primary) hypertension: Secondary | ICD-10-CM

## 2012-10-19 DIAGNOSIS — N951 Menopausal and female climacteric states: Secondary | ICD-10-CM | POA: Diagnosis not present

## 2012-10-19 DIAGNOSIS — R609 Edema, unspecified: Secondary | ICD-10-CM

## 2012-10-19 DIAGNOSIS — E785 Hyperlipidemia, unspecified: Secondary | ICD-10-CM

## 2012-10-19 DIAGNOSIS — Z6841 Body Mass Index (BMI) 40.0 and over, adult: Secondary | ICD-10-CM | POA: Diagnosis not present

## 2012-10-19 DIAGNOSIS — Z0000 Encounter for general adult medical examination without abnormal findings: Secondary | ICD-10-CM

## 2012-10-19 DIAGNOSIS — R809 Proteinuria, unspecified: Secondary | ICD-10-CM

## 2012-10-19 DIAGNOSIS — R600 Localized edema: Secondary | ICD-10-CM | POA: Insufficient documentation

## 2012-10-19 LAB — BASIC METABOLIC PANEL
CO2: 31 mEq/L (ref 19–32)
Calcium: 10 mg/dL (ref 8.4–10.5)
Potassium: 3.7 mEq/L (ref 3.5–5.3)
Sodium: 140 mEq/L (ref 135–145)

## 2012-10-19 LAB — TSH: TSH: 2.876 u[IU]/mL (ref 0.350–4.500)

## 2012-10-19 MED ORDER — AMLODIPINE BESYLATE 10 MG PO TABS: 10.0000 mg | ORAL_TABLET | Freq: Every day | ORAL | Status: DC

## 2012-10-19 NOTE — Progress Notes (Addendum)
Subjective:    Patient ID: Amy Jarvis, female    DOB: [birth year], 51 y.o.   MRN: [id number]  HPI Comments: 51 y.o woman former [facility] patient PMH HLD (TC 255, TG 127, HDL 49, LDL 181 10/07/12), morbid obesity, HTN, OSA on cpap, GERD, diverticulosis, perimenopause, vertigo, degenerative disc disease.    She presents for follow up for her blood pressure (132/71 today).  She stopped the Cozaar 50 mg about 1 week ago.  She explains she was previously taking this medication 100 mg at [facility] but it caused her to have decreased urination, increasing swelling of her ankles and feet so she was stopped.  She stopped this medication on her own 1 week ago and resumed Norvasc 10 mg because she was concerned about the side effects of Cozaar.    She complains of hair loss and she is sensitive to the cold so she asks about her tsh being checked.  Last used relaxer and hair dye 4 months ago.   She has chronic edema lower extremities and wears compressive socks and tries to elevate her feet.    She mentions she brought up the fact that she has vaginal bleeding on last visit 10/07/12.  She did have a menstrual cycle earlier this year.  She has intermittent vaginal spotting light spotting to red (gross blood).  She has not had a hysterectomy and not had a pap smear since 12/2009.  At times she wipes and notices blood or she will see a drop in the toilet.    She is requesting the Zostavax vaccine even though she is not > or = 60.  She states her pharmacist told her it would be approved by insurance.  She also mention she say a billboard stating if you had your hepatitis immunizations before a certain year you may need repeat immunizations.    She inquires about her labs from the last visit.  SH: Previously worked as a Lawyer, now on disability, She has kids.       Review of Systems  Constitutional: Negative for appetite change and unexpected weight change.  Respiratory: Negative for shortness of  breath.   Cardiovascular: Positive for leg swelling. Negative for chest pain.       Chronic leg swelling  Gastrointestinal: Negative for abdominal pain.  Genitourinary: Positive for vaginal bleeding. Negative for hematuria.  Musculoskeletal: Positive for back pain.  Skin:       +hair loss        Objective:   Physical Exam  Nursing note and vitals reviewed. Constitutional: She is oriented to person, place, and time. She appears well-developed and well-nourished. She is cooperative. No distress.       Pleasant   HENT:  Head: Normocephalic and atraumatic.  Mouth/Throat: Oropharynx is clear and moist and mucous membranes are normal. Abnormal dentition. No oropharyngeal exudate.       Poor dentition has not seen dentist in 20 years   Eyes: Conjunctivae normal are normal. Pupils are equal, round, and reactive to light. Right eye exhibits no discharge. Left eye exhibits no discharge. No scleral icterus.  Cardiovascular: Normal heart sounds.  Tachycardia present.   No murmur heard.      Mild tachycardia   Pulmonary/Chest: Effort normal and breath sounds normal. No respiratory distress. She has no wheezes.  Abdominal: Soft. Bowel sounds are normal. She exhibits no distension. There is no tenderness.       Obese ab  Lymphadenopathy:       2+ lower  extremity edema L>R  Neurological: She is alert and oriented to person, place, and time. Gait normal.  Skin: Skin is warm, dry and intact. No rash noted. She is not diaphoretic.  Psychiatric: She has a normal mood and affect. Her speech is normal and behavior is normal. Judgment and thought content normal. Cognition and memory are normal.          Assessment & Plan:  12/2012 repeat Urine Alb/Creatinine ration and do pap smear.   INTERNAL MEDICINE TEACHING ATTENDING ADDENDUM - Lars Mage, MD: I personally saw and evaluated Ms Husted in this clinic visit in conjunction with the resident, Dr. Shirlee Latch. I have discussed the patient's plan of care  with Dr. Shirlee Latch during this visit. I have confirmed the physical exam findings and have read and agree with the clinic note including the plan.

## 2012-10-19 NOTE — Assessment & Plan Note (Signed)
Having vaginal bleeding and spotting intermittently since 03/2012 and LMP this year Will do pap smear 01/2012 If continues to be a problem consider referral to OB/GYN

## 2012-10-19 NOTE — Assessment & Plan Note (Signed)
Pt requests Zostavax though not indicated for age.  Advised age is 48 or older, she still insists. Rx Zostavax 0.65 ml sqx1 called into pharmacy per Nurse Gladys Pap 01/2012 This is not routine but patient requesting Hepatitis status be known.  No previous titers drawn or acute hepatitis panel in the past.  Advised patient this may not be covered by insurance.

## 2012-10-19 NOTE — Assessment & Plan Note (Signed)
Encouraged diet and wt modifications

## 2012-10-19 NOTE — Assessment & Plan Note (Signed)
BP 132/71 on Norvasc 10 mg qd and HCTZ 25 mg qd. Pt discontinued Cozaar herself prior to today's visit  Will check BMET Concerned for hypertensive nephropathy with Urine Alb/Creatinine ratio>100.  Will repeat in 01/2012 Advised patient ARB may help proteinuria but she refused to take ARB despite benefits due to worry of side effects

## 2012-10-19 NOTE — Assessment & Plan Note (Signed)
LDL not at goal it is 181, [place] risk score is 3% in 10 years  Discuss diet and exercise modifications. Keep reassessing and discussing in the future Patient is not on a statin currently

## 2012-10-19 NOTE — Patient Instructions (Addendum)
Please follow up in 3 months for pap smear  Perimenopause Perimenopause is the time when your body begins to move into the menopause (no menstrual period for 12 straight months). It is a natural process. Perimenopause can begin 2 to 8 years before the menopause and usually lasts for one year after the menopause. During this time, your ovaries may or may not produce an egg. The ovaries vary in their production of estrogen and progesterone hormones each month. This can cause irregular menstrual periods, difficulty in getting pregnant, vaginal bleeding between periods and uncomfortable symptoms. CAUSES  Irregular production of the ovarian hormones, estrogen and progesterone, and not ovulating every month.  Other causes include:  Tumor of the pituitary gland in the brain.  Medical disease that affects the ovaries.  Radiation treatment.  Chemotherapy.  Unknown causes.  Heavy smoking and excessive alcohol intake can bring on perimenopause sooner. SYMPTOMS   Hot flashes.  Night sweats.  Irregular menstrual periods.  Decrease sex drive.  Vaginal dryness.  Headaches.  Mood swings.  Depression.  Memory problems.  Irritability.  Tiredness.  Weight gain.  Trouble getting pregnant.  The beginning of losing bone cells (osteoporosis).  The beginning of hardening of the arteries (atherosclerosis). DIAGNOSIS  Your caregiver will make a diagnosis by analyzing your age, menstrual history and your symptoms. They will do a physical exam noting any changes in your body, especially your female organs. Female hormone tests may or may not be helpful depending on the amount and when you produce the female hormones. However, other hormone tests may be helpful (ex. thyroid hormone) to rule out other problems. TREATMENT  The decision to treat during the perimenopause should be made by you and your caregiver depending on how the symptoms are affecting you and your life style. There are  various treatments available such as:  Treating individual symptoms with a specific medication for that symptom (ex. tranquilizer for depression).  Herbal medications that can help specific symptoms.  Counseling.  Group therapy.  No treatment. HOME CARE INSTRUCTIONS   Before seeing your caregiver, make a list of your menstrual periods (when the occur, how heavy they are, how long between periods and how long they last), your symptoms and when they started.  Take the medication as recommended by your caregiver.  Sleep and rest.  Exercise.  Eat a diet that contains calcium (good for your bones) and soy (acts like estrogen hormone).  Do not smoke.  Avoid alcoholic beverages.  Taking vitamin E may help in certain cases.  Take calcium and vitamin D supplements to help prevent bone loss.  Group therapy is sometimes helpful.  Acupuncture may help in some cases. SEEK MEDICAL CARE IF:   You have any of the above and want to know if it is perimenopause.  You want advice and treatment for any of your symptoms mentioned above.  You need a referral to a specialist (gynecologist, psychiatrist or psychologist). SEEK IMMEDIATE MEDICAL CARE IF:   You have vaginal bleeding.  Your period lasts longer than 8 days.  You periods are recurring sooner than 21 days.  You have bleeding after intercourse.  You have severe depression.  You have pain when you urinate.  You have severe headaches.  You develop vision problems. Document Released: 12/24/2004 Document Revised: 02/08/2012 Document Reviewed: 09/13/2008 [facility] Patient Information 2013 [place], [place].  Proteinuria Proteinuria describes a condition in which urine contains an abnormal amount of protein. Proteins in your blood perform a number of important functions. They  include:  Protecting you from infection.  Helping your blood thicken at the site of an injury to prevent blood loss (coagulate).  Keeping the right  amount of fluid circulating through your body. As blood passes through healthy kidneys, the kidneys filter the waste products out. The main protein that is most likely to appear in urine is albumin. Albumin is smaller and more likely to escape through the filters of the kidney.  CAUSES   Many diseases can cause this inflammation of the kidney, which leads to proteinuria. Problems that can damage the kidney and cause proteinuria include:  Diabetes.  High blood pressure (hypertension).  Other forms of kidney diseases.  Research shows that the level and type of proteinuria strongly determine:  The extent of damage.  Your risk for developing progressive kidney failure.  Proteinuria has also been shown to be associated with cardiovascular disease. Damaged blood vessels may lead to heart failure or stroke as well as kidney failure. If your caregiver finds that you have proteinuria, you will want to do what you can to protect your health and prevent any of these diseases from developing.  Several health organizations recommend that some people be regularly checked for proteinuria. This is to detect and treat kidney disease before it progresses. Proteinuria may be the best predictor of progressive kidney failure in people with type 2 diabetes. The American Diabetes Association recommends regular urine testing for proteinuria for people with type 1 or type 2 diabetes. RISK FACTORS Who is at risk?  People with diabetes, high blood pressure, or certain family backgrounds are at risk for proteinuria. Diabetes is a leading cause of end-stage renal disease (ESRD). That is the result of chronic kidney disease. In both type 1 and type 2 diabetes, the first sign of deteriorating kidney function is the presence of small amounts of albumin in the urine. This is a condition called microalbuminuria. As kidney function declines, the amount of albumin in the urine increases. Microalbuminuria becomes full-fledged  proteinuria.  High blood pressure is the second leading cause of ESRD. Proteinuria in people with high blood pressure is an indicator of declining kidney function. If high blood pressure is not controlled, it can lead to full renal failure.  African Americans are more likely than white Americans to have high blood pressure and to develop kidney problems from it. African Americans ages 55 to 56 are 20 times more likely than their white counterparts to develop high blood pressure-related kidney failure. High blood pressure is the leading cause of kidney failure among African Americans.  Other groups at risk for proteinuria are:  American Indians.  Hispanic Americans.  Pacific Islander Americans.  Older people.  Overweight people.  People with a family history of kidney disease. SYMPTOMS Large amounts of protein in your urine may cause your urine to look foamy in the toilet. Also, because the protein has left your body, your blood can no longer soak up enough fluid. So you may notice swelling in your:  Hands.  Feet.  Abdomen.  Face. These are signs of large protein loss. You may have proteinuria without noticing any signs or symptoms. Testing is the only way to find out how much protein you have in your urine.  TESTS FOR PROTEINURIA  To test for proteinuria, you will need to give a urine sample. A strip of chemically treated paper will change color when dipped in urine that has too much protein. More sensitive tests for protein or albumin in the urine are recommended  for people at risk for kidney disease. If the lab test shows high levels of protein, another test should be done 1 to 2 weeks later. If the second test also shows high levels of protein, you have persistent proteinuria. You should have additional tests to evaluate your kidney function.  Your caregiver will also test a sample of your blood. This is to look for waste products (creatinine and urea nitrogen). Normally healthy  kidneys remove these from the blood. High levels of creatinine and urea nitrogen in your blood indicate that kidney function is impaired.  If screening tests raise concern that you may have proteinuria, 24 hour collection of urine may be needed to determine the amount of protein in your urine. Instructions on the proper collection and storage of a 24 hour specimen will be included when you receive the collection device. TREATMENT   If you have diabetes, high blood pressure, or both, the first goal of treatment will be to control your blood glucose and blood pressure. If you have diabetes, you should test your blood glucose often, follow a healthy eating plan, take your medicines, and get plenty of exercise. If you have diabetes and high blood pressure, your caregiver may prescribe:  Angiotensin-converting enzyme (ACE) inhibitors.  Angiotensin receptor blockers (ARB).  People who have high blood pressure and proteinuria but not diabetes may also benefit from taking an ACE inhibitor or ARB. Their blood pressure should be maintained below 130/80.  Your caregiver may refer you to a dietitian. They may restrict your dietary salt and protein. They can help you follow a healthy eating plan. FOR MORE INFORMATION  [employer]: www.kidneyfund.org National Kidney Foundation: www.kidney.org Document Released: 01/06/2006 Document Revised: 02/08/2012 Document Reviewed: 04/15/2012 [facility] Patient Information 2013 [place], [place].  Cholesterol Cholesterol is a white, waxy, fat-like protein needed by your body in small amounts. The liver makes all the cholesterol you need. It is carried from the liver by the blood through the blood vessels. Deposits (plaque) may build up on blood vessel walls. This makes the arteries narrower and stiffer. Plaque increases the risk for heart attack and stroke. You cannot feel your cholesterol level even if it is very high. The only way to know is by a blood test to  check your lipid (fats) levels. Once you know your cholesterol levels, you should keep a record of the test results. Work with your caregiver to to keep your levels in the desired range. WHAT THE RESULTS MEAN:  Total cholesterol is a rough measure of all the cholesterol in your blood.  LDL is the so-called bad cholesterol. This is the type that deposits cholesterol in the walls of the arteries. You want this level to be low.  HDL is the good cholesterol because it cleans the arteries and carries the LDL away. You want this level to be high.  Triglycerides are fat that the body can either burn for energy or store. High levels are closely linked to heart disease. DESIRED LEVELS:  Total cholesterol below 200.  LDL below 100 for people at risk, below 70 for very high risk.  HDL above 50 is good, above 60 is best.  Triglycerides below 150. HOW TO LOWER YOUR CHOLESTEROL:  Diet.  Choose fish or white meat chicken and [place], roasted or baked. Limit fatty cuts of red meat, fried foods, and processed meats, such as sausage and lunch meat.  Eat lots of fresh fruits and vegetables. Choose whole grains, beans, pasta, potatoes and cereals.  Use  only small amounts of olive, corn or canola oils. Avoid butter, mayonnaise, shortening or palm kernel oils. Avoid foods with trans-fats.  Use skim/nonfat milk and low-fat/nonfat yogurt and cheeses. Avoid whole milk, cream, ice cream, egg yolks and cheeses. Healthy desserts include angel food cake, ginger snaps, animal crackers, hard candy, popsicles, and low-fat/nonfat frozen yogurt. Avoid pastries, cakes, pies and cookies.  Exercise.  A regular program helps decrease LDL and raises HDL.  Helps with weight control.  Do things that increase your activity level like gardening, walking, or taking the stairs.  Medication.  May be prescribed by your caregiver to help lowering cholesterol and the risk for heart disease.  You may need medicine even if  your levels are normal if you have several risk factors. HOME CARE INSTRUCTIONS   Follow your diet and exercise programs as suggested by your caregiver.  Take medications as directed.  Have blood work done when your caregiver feels it is necessary. MAKE SURE YOU:   Understand these instructions.  Will watch your condition.  Will get help right away if you are not doing well or get worse. Document Released: 08/11/2001 Document Revised: 02/08/2012 Document Reviewed: 02/01/2008 [facility] Patient Information 2013 [place], [place].

## 2012-10-20 ENCOUNTER — Encounter: Payer: Self-pay | Admitting: Internal Medicine

## 2012-10-24 DIAGNOSIS — M545 Low back pain, unspecified: Secondary | ICD-10-CM | POA: Diagnosis not present

## 2012-10-24 DIAGNOSIS — M7700 Medial epicondylitis, unspecified elbow: Secondary | ICD-10-CM | POA: Diagnosis not present

## 2012-10-25 ENCOUNTER — Ambulatory Visit: Payer: Medicare Other | Admitting: Physical Therapy

## 2012-10-26 ENCOUNTER — Ambulatory Visit: Payer: Medicare Other | Attending: Internal Medicine | Admitting: Physical Therapy

## 2012-10-26 DIAGNOSIS — IMO0001 Reserved for inherently not codable concepts without codable children: Secondary | ICD-10-CM | POA: Diagnosis not present

## 2012-10-26 DIAGNOSIS — R42 Dizziness and giddiness: Secondary | ICD-10-CM | POA: Insufficient documentation

## 2012-11-02 ENCOUNTER — Ambulatory Visit: Payer: Medicare Other | Attending: Internal Medicine | Admitting: Physical Therapy

## 2012-11-02 DIAGNOSIS — R42 Dizziness and giddiness: Secondary | ICD-10-CM | POA: Diagnosis not present

## 2012-11-02 DIAGNOSIS — IMO0001 Reserved for inherently not codable concepts without codable children: Secondary | ICD-10-CM | POA: Diagnosis not present

## 2012-11-08 ENCOUNTER — Ambulatory Visit: Payer: Medicare Other | Admitting: Physical Therapy

## 2013-02-01 ENCOUNTER — Other Ambulatory Visit: Payer: Self-pay | Admitting: *Deleted

## 2013-02-01 NOTE — Telephone Encounter (Signed)
Last visit 10/19/12 Not sure if Kcl was ordered by [name]

## 2013-02-02 MED ORDER — ESOMEPRAZOLE MAGNESIUM 40 MG PO CPDR: 40.0000 mg | DELAYED_RELEASE_CAPSULE | Freq: Every day | ORAL | Status: DC

## 2013-02-02 MED ORDER — POTASSIUM CHLORIDE ER 20 MEQ PO TBCR: 20.0000 mg | EXTENDED_RELEASE_TABLET | Freq: Every day | ORAL | Status: DC

## 2013-04-03 ENCOUNTER — Ambulatory Visit (INDEPENDENT_AMBULATORY_CARE_PROVIDER_SITE_OTHER): Payer: Medicare Other | Admitting: Internal Medicine

## 2013-04-03 VITALS — BP 169/91 | HR 73 | Temp 99.2°F | Ht 68.0 in | Wt 326.0 lb

## 2013-04-03 DIAGNOSIS — R0602 Shortness of breath: Secondary | ICD-10-CM | POA: Diagnosis not present

## 2013-04-03 DIAGNOSIS — R632 Polyphagia: Secondary | ICD-10-CM

## 2013-04-03 DIAGNOSIS — R358 Other polyuria: Secondary | ICD-10-CM

## 2013-04-03 DIAGNOSIS — R252 Cramp and spasm: Secondary | ICD-10-CM | POA: Diagnosis not present

## 2013-04-03 DIAGNOSIS — E785 Hyperlipidemia, unspecified: Secondary | ICD-10-CM | POA: Diagnosis not present

## 2013-04-03 DIAGNOSIS — N951 Menopausal and female climacteric states: Secondary | ICD-10-CM | POA: Diagnosis not present

## 2013-04-03 DIAGNOSIS — R809 Proteinuria, unspecified: Secondary | ICD-10-CM

## 2013-04-03 DIAGNOSIS — R3589 Other polyuria: Secondary | ICD-10-CM | POA: Diagnosis not present

## 2013-04-03 DIAGNOSIS — I10 Essential (primary) hypertension: Secondary | ICD-10-CM

## 2013-04-03 DIAGNOSIS — R631 Polydipsia: Secondary | ICD-10-CM | POA: Diagnosis not present

## 2013-04-03 LAB — CBC WITH DIFFERENTIAL/PLATELET
Eosinophils Absolute: 0.2 10*3/uL (ref 0.0–0.7)
Eosinophils Relative: 3 % (ref 0–5)
Hemoglobin: 11.5 g/dL — ABNORMAL LOW (ref 12.0–15.0)
Lymphs Abs: 2.1 10*3/uL (ref 0.7–4.0)
MCH: 25.4 pg — ABNORMAL LOW (ref 26.0–34.0)
MCV: 78.4 fL (ref 78.0–100.0)
Monocytes Relative: 6 % (ref 3–12)
RBC: 4.53 MIL/uL (ref 3.87–5.11)

## 2013-04-03 MED ORDER — AMLODIPINE BESYLATE 10 MG PO TABS: 5.0000 mg | ORAL_TABLET | Freq: Every day | ORAL | Status: DC

## 2013-04-03 MED ORDER — LOSARTAN POTASSIUM 25 MG PO TABS: 25.0000 mg | ORAL_TABLET | Freq: Every day | ORAL | Status: DC

## 2013-04-03 MED ORDER — HYDROCHLOROTHIAZIDE 25 MG PO TABS: 25.0000 mg | ORAL_TABLET | Freq: Every day | ORAL | Status: DC

## 2013-04-03 MED ORDER — POTASSIUM CHLORIDE ER 20 MEQ PO TBCR: 20.0000 mg | EXTENDED_RELEASE_TABLET | Freq: Every day | ORAL | Status: DC

## 2013-04-03 NOTE — Patient Instructions (Addendum)
General Instructions:  Revisit in 1-2 months for BP follow up, pap smear, and urine check  Take blood pressure medications as prescribed    Hypertension As your heart beats, it forces blood through your arteries. This force is your blood pressure. If the pressure is too high, it is called hypertension (HTN) or high blood pressure. HTN is dangerous because you may have it and not know it. High blood pressure may mean that your heart has to work harder to pump blood. Your arteries may be narrow or stiff. The extra work puts you at risk for heart disease, stroke, and other problems.  Blood pressure consists of two numbers, a higher number over a lower, 110/72, for example. It is stated as "110 over 72." The ideal is below 120 for the top number (systolic) and under 80 for the bottom (diastolic). Write down your blood pressure today. You should pay close attention to your blood pressure if you have certain conditions such as:  Heart failure.  Prior heart attack.  Diabetes  Chronic kidney disease.  Prior stroke.  Multiple risk factors for heart disease. To see if you have HTN, your blood pressure should be measured while you are seated with your arm held at the level of the heart. It should be measured at least twice. A one-time elevated blood pressure reading (especially in the Emergency Department) does not mean that you need treatment. There may be conditions in which the blood pressure is different between your right and left arms. It is important to see your caregiver soon for a recheck. Most people have essential hypertension which means that there is not a specific cause. This type of high blood pressure may be lowered by changing lifestyle factors such as:  Stress.  Smoking.  Lack of exercise.  Excessive weight.  Drug/tobacco/alcohol use.  Eating less salt. Most people do not have symptoms from high blood pressure until it has caused damage to the body. Effective treatment can  often prevent, delay or reduce that damage. TREATMENT  When a cause has been identified, treatment for high blood pressure is directed at the cause. There are a large number of medications to treat HTN. These fall into several categories, and your caregiver will help you select the medicines that are best for you. Medications may have side effects. You should review side effects with your caregiver. If your blood pressure stays high after you have made lifestyle changes or started on medicines,   Your medication(s) may need to be changed.  Other problems may need to be addressed.  Be certain you understand your prescriptions, and know how and when to take your medicine.  Be sure to follow up with your caregiver within the time frame advised (usually within two weeks) to have your blood pressure rechecked and to review your medications.  If you are taking more than one medicine to lower your blood pressure, make sure you know how and at what times they should be taken. Taking two medicines at the same time can result in blood pressure that is too low. SEEK IMMEDIATE MEDICAL CARE IF:  You develop a severe headache, blurred or changing vision, or confusion.  You have unusual weakness or numbness, or a faint feeling.  You have severe chest or abdominal pain, vomiting, or breathing problems. MAKE SURE YOU:   Understand these instructions.  Will watch your condition.  Will get help right away if you are not doing well or get worse. Document Released: 11/16/2005 Document Revised:  02/08/2012 Document Reviewed: 07/06/2008 [facility] Patient Information 2013 [place], [place].  Shortness of Breath Shortness of breath means you have trouble breathing. Shortness of breath may indicate that you have a medical problem. You should seek immediate medical care for shortness of breath. CAUSES   Not enough oxygen in the air (as with high altitudes or a smoke-filled room).  Short-term (acute) lung  disease, including:  Infections, such as pneumonia.  Fluid in the lungs, such as heart failure.  A blood clot in the lungs (pulmonary embolism).  Long-term (chronic) lung diseases.  Heart disease (heart attack, angina, heart failure, and others).  Low red blood cells (anemia).  Poor physical fitness. This can cause shortness of breath when you exercise.  Chest or back injuries or stiffness.  Being overweight.  Smoking.  Anxiety. This can make you feel like you are not getting enough air. DIAGNOSIS  Serious medical problems can usually be found during your physical exam. Tests may also be done to determine why you are having shortness of breath. Tests may include:  Chest X-rays.  Lung function tests.  Blood tests.  Electrocardiography.  Exercise testing.  Echocardiography.  Imaging scans. Your caregiver may not be able to find a cause for your shortness of breath after your exam. In this case, it is important to have a follow-up exam with your caregiver as directed.  TREATMENT  Treatment for shortness of breath depends on the cause of your symptoms and can vary greatly. HOME CARE INSTRUCTIONS   Do not smoke. Smoking is a common cause of shortness of breath. If you smoke, ask for help to quit.  Avoid being around chemicals or things that may bother your breathing, such as paint fumes and dust.  Rest as needed. Slowly resume your usual activities.  If medicines were prescribed, take them as directed for the full length of time directed. This includes oxygen and any inhaled medicines.  Keep all follow-up appointments as directed by your caregiver. SEEK MEDICAL CARE IF:   Your condition does not improve in the time expected.  You have a hard time doing your normal activities even with rest.  You have any side effects or problems with the medicines prescribed.  You develop any new symptoms. SEEK IMMEDIATE MEDICAL CARE IF:   Your shortness of breath gets  worse.  You feel lightheaded, faint, or develop a cough not controlled with medicines.  You start coughing up blood.  You have pain with breathing.  You have chest pain or pain in your arms, shoulders, or abdomen.  You have a fever.  You are unable to walk up stairs or exercise the way you normally do. MAKE SURE YOU:  Understand these instructions.  Will watch your condition.  Will get help right away if you are not doing well or get worse. Document Released: 08/11/2001 Document Revised: 05/17/2012 Document Reviewed: 02/01/2012 [facility] Patient Information 2013 [place], [place].  Menopause Menopause is the normal time of life when menstrual periods stop completely. Menopause is complete when you have missed 12 consecutive menstrual periods. It usually occurs between the ages of 68 to 11, with an average age of 20. Very rarely does a woman develop menopause before 53 years old. At menopause, your ovaries stop producing the female hormones, estrogen and progesterone. This can cause undesirable symptoms and also affect your health. Sometimes the symptoms may occur 4 to 5 years before the menopause begins. There is no relationship between menopause and:  Oral contraceptives.  Number of children you  had.  Race.  The age your menstrual periods started (menarche). Heavy smokers and very thin women may develop menopause earlier in life. CAUSES  The ovaries stop producing the female hormones estrogen and progesterone.  Other causes include:  Surgery to remove both ovaries.  The ovaries stop functioning for no known reason.  Tumors of the pituitary gland in the brain.  Medical disease that affects the ovaries and hormone production.  Radiation treatment to the abdomen or pelvis.  Chemotherapy that affects the ovaries. SYMPTOMS   Hot flashes.  Night sweats.  Decrease in sex drive.  Vaginal dryness and thinning of the vagina causing painful intercourse.  Dryness of the  skin and developing wrinkles.  Headaches.  Tiredness.  Irritability.  Memory problems.  Weight gain.  Bladder infections.  Hair growth of the face and chest.  Infertility. More serious symptoms include:  Loss of bone (osteoporosis) causing breaks (fractures).  Depression.  Hardening and narrowing of the arteries (atherosclerosis) causing heart attacks and strokes. DIAGNOSIS   When the menstrual periods have stopped for 12 straight months.  Physical exam.  Hormone studies of the blood. TREATMENT  There are many treatment choices and nearly as many questions about them. The decisions to treat or not to treat menopausal changes is an individual choice made with your caregiver. Your caregiver can discuss the treatments with you. Together, you can decide which treatment will work best for you. Your treatment choices may include:   Hormone therapy (estorgen and progesterone).  Non-hormonal medications.  Treating the individual symptoms with medication (for example antidepressants for depression).  Herbal medications that may help specific symptoms.  Counseling by a psychiatrist or psychologist.  Group therapy.  Lifestyle changes including:  Eating healthy.  Regular exercise.  Limiting caffeine and alcohol.  Stress management and meditation.  No treatment. HOME CARE INSTRUCTIONS   Take the medication your caregiver gives you as directed.  Get plenty of sleep and rest.  Exercise regularly.  Eat a diet that contains calcium (good for the bones) and soy products (acts like estrogen hormone).  Avoid alcoholic beverages.  Do not smoke.  If you have hot flashes, dress in layers.  Take supplements, calcium and vitamin D to strengthen bones.  You can use over-the-counter lubricants or moisturizers for vaginal dryness.  Group therapy is sometimes very helpful.  Acupuncture may be helpful in some cases. SEEK MEDICAL CARE IF:   You are not sure you are  in menopause.  You are having menopausal symptoms and need advice and treatment.  You are still having menstrual periods after age 15.  You have pain with intercourse.  Menopause is complete (no menstrual period for 12 months) and you develop vaginal bleeding.  You need a referral to a specialist (gynecologist, psychiatrist or psychologist) for treatment. SEEK IMMEDIATE MEDICAL CARE IF:   You have severe depression.  You have excessive vaginal bleeding.  You fell and think you have a broken bone.  You have pain when you urinate.  You develop leg or chest pain.  You have a fast pounding heart beat (palpitations).  You have severe headaches.  You develop vision problems.  You feel a lump in your breast.  You have abdominal pain or severe indigestion. Document Released: 02/06/2004 Document Revised: 02/08/2012 Document Reviewed: 09/13/2008 [facility] Patient Information 2013 [place], [place].  Leg Cramps Leg cramps that occur during exercise can be caused by poor circulation or dehydration. However, muscle cramps that occur at rest or during the night are usually  not due to any serious medical problem. Heat cramps may cause muscle spasms during hot weather.  CAUSES There is no clear cause for muscle cramps. However, dehydration may be a factor for those who do not drink enough fluids and those who exercise in the heat. Imbalances in the level of sodium, potassium, calcium or magnesium in the muscle tissue may also be a factor. Some medications, such as water pills (diuretics), may cause loss of chemicals that the body needs (like sodium and potassium) and cause muscle cramps. TREATMENT   Make sure your diet has enough fluids and essential minerals for the muscle to work normally.  Avoid strenuous exercise for several days if you have been having frequent leg cramps.  Stretch and massage the cramped muscle for several minutes.  Some medicines may be helpful in some patients  with night cramps. Only take over-the-counter or prescription medicines as directed by your caregiver. SEEK IMMEDIATE MEDICAL CARE IF:   Your leg cramps become worse.  Your foot becomes cold, numb, or blue. Document Released: 12/24/2004 Document Revised: 02/08/2012 Document Reviewed: 12/11/2008 [facility] Patient Information 2013 [place], [place].  Treatment Goals:  Goals (1 Years of Data) as of 04/03/13         As of Today As of Today 10/19/12 10/07/12 01/20/11     Blood Pressure    . Blood Pressure < 140/90  169/91 159/84 132/71 157/105 140/84     Result Component    . LDL CALC < 130     181       Progress Toward Treatment Goals:  Treatment Goal 04/03/2013  Blood pressure deteriorated    Self Care Goals & Plans:  Self Care Goal 04/03/2013  Manage my medications take my medicines as prescribed; bring my medications to every visit; refill my medications on time  Monitor my health keep track of my blood pressure  Eat healthy foods drink diet soda or water instead of juice or soda; eat more vegetables; eat foods that are low in salt; eat baked foods instead of fried foods; eat fruit for snacks and desserts; eat smaller portions  Be physically active find an activity I enjoy  Meeting treatment goals maintain the current self-care plan       Care Management & Community Referrals:  Referral 04/03/2013  Referrals made to community resources none

## 2013-04-03 NOTE — Assessment & Plan Note (Addendum)
BP Readings from Last 3 Encounters:  04/03/13 169/91  10/19/12 132/71  10/07/12 157/105    Lab Results  Component Value Date   NA 140 04/03/2013   K 4.2 04/03/2013   CREATININE 0.71 04/03/2013    Assessment: Blood pressure control: moderately elevated Progress toward BP goal:  deteriorated Comments: resume Cozaar 25 mg qd, decrease Norvasc to 5 mg qd due to edema, cont. HCTZ 25 mg qd  Plan: Medications:  continue current medications adding Cozaar 25 mg qd  Educational resources provided: brochure;handout Self management tools provided: home blood pressure logbook Other plans: none. Encouraged compliance with medications. resume Cozaar 25 mg qd, decrease Norvasc to 5 mg qd due to edema, cont. HCTZ 25 mg qd. BMET today check K F/u 1-2 months HTN

## 2013-04-04 ENCOUNTER — Encounter: Payer: Self-pay | Admitting: Internal Medicine

## 2013-04-04 LAB — BASIC METABOLIC PANEL
BUN: 14 mg/dL (ref 6–23)
CO2: 29 mEq/L (ref 19–32)
Chloride: 101 mEq/L (ref 96–112)
Potassium: 4.2 mEq/L (ref 3.5–5.3)

## 2013-04-05 NOTE — Progress Notes (Signed)
I discussed this case with Dr. Shirlee Latch soon after she saw the patient. I have read her documentation and I agree with her plan of care. Please see the resident note for details of management.

## 2013-04-07 ENCOUNTER — Encounter: Payer: Self-pay | Admitting: Internal Medicine

## 2013-04-07 NOTE — Assessment & Plan Note (Signed)
Lipid Panel     Component Value Date/Time   CHOL 255* 10/07/2012 1653   TRIG 127 10/07/2012 1653   HDL 49 10/07/2012 1653   CHOLHDL 5.2 10/07/2012 1653   VLDL 25 10/07/2012 1653   LDLCALC 181* 10/07/2012 1653  Will continue to assess. [place] Risk score 3% in 10 years

## 2013-04-07 NOTE — Assessment & Plan Note (Signed)
Will repeat urine Alb/Creatinine at f/u. Etiology needs to be worked up  Resumed ARB

## 2013-04-07 NOTE — Assessment & Plan Note (Signed)
Will w/u in the future.  Needs to decide if cardiac or pulmonary.  Patient is not a smoker but does have OSA on cpap.  Consider CXR, pfts, echo in the future.  RTC if worsening prior to f/u

## 2013-04-07 NOTE — Progress Notes (Signed)
  Subjective:    Patient ID: Amy Jarvis, female    DOB: 29-Jul-1961, 52 y.o.   MRN: [id number]  HPI Comments: Patient seen and evaluated 5/514.  She was a walk in patient.  Presenting a CC of thinking her potassium was too low.  She states she has been having muscles cramps, h/a weakness, shakiness and nervousness.  She has other complaints as well stating for the past 7-8 years she has had irregular menstrual cycles.  For the last 6 months she has been without a menstrual cycle (she denies sex, contraceptive use, FH except maternal great aunt with breast cancer).  She has had her cycle since Friday or Saturday prior to visit.  She is using 5-6 pads daily and is seeing clots.  She has not had a pap smear since 12/2009 and one has been offered to her in the recent past but she did not come back to follow up in 12/2012.  She declines exam today.  TSH 2.876 09/2012.    She has a history of HTN (BP 159/84, 169/91 today).  Patient states when she checks her BP outpatient it is 130s/70s.  She stopped her Cozaar months ago due to thinking if affected her urine output.  She is taking Norvasc 10 mg, HCTZ 25 mg.  She thinks her lower extremity edema is worsening especially on the left foot so she has been taking Norvasc every other day.    She reports sob at rest and with exertion worsening over the last 4-5 months.   SH: Denies ever using tobacco     Review of Systems  Respiratory:       Denies orthopnea. Sleeps on 3 pillows unchanged  She can walk 10 min or less and gets sob   Cardiovascular: Negative for chest pain.  Endocrine: Positive for polydipsia, polyphagia and polyuria.  Genitourinary: Negative for dysuria.       Objective:   Physical Exam  Nursing note and vitals reviewed. Constitutional: She is oriented to person, place, and time. She appears well-developed and well-nourished. She is cooperative. No distress.  HENT:  Head: Normocephalic and atraumatic.  Mouth/Throat: Oropharynx is  clear and moist and mucous membranes are normal. No oropharyngeal exudate.  Eyes: Conjunctivae are normal. Pupils are equal, round, and reactive to light. Right eye exhibits no discharge. Left eye exhibits no discharge. No scleral icterus.  Cardiovascular: Normal rate, regular rhythm, S1 normal, S2 normal and normal heart sounds.   No murmur heard. 2 + edema lower extremities b/l L>R  Pulmonary/Chest: Effort normal and breath sounds normal. No respiratory distress. She has no wheezes.  Abdominal: Soft. Bowel sounds are normal. There is no tenderness.  Obese ab  Neurological: She is alert and oriented to person, place, and time. Gait normal.  Skin: Skin is warm, dry and intact. No rash noted. She is not diaphoretic.  Psychiatric: She has a normal mood and affect. Her speech is normal and behavior is normal. Judgment and thought content normal. Cognition and memory are normal.          Assessment & Plan:  F/u 1-2 months for pap smear, urine Alb/Creatinine ratio, HTN, sob with rest and exertion with Shirlee Latch

## 2013-04-07 NOTE — Assessment & Plan Note (Signed)
Try stretching exercises, increased water intake, warm water, ice massage. Last resort addition of Neurontin (600 mg qd), Benadryl 25 mg qhs, CCB (Dil 30 or Verapamil 120-180 qhs)

## 2013-04-07 NOTE — Assessment & Plan Note (Signed)
Concern for menopause with symptoms of irregular menstrual bleeding.   Will do a pap smear a f/u to r/o malignancy.  Could consider FSH, LH, testosterone, estrogen, prolactin in the future, most recent tsh normal.   Consider referral to OB/GYN

## 2013-04-10 NOTE — Progress Notes (Signed)
INTERNAL MEDICINE TEACHING ATTENDING ADDENDUM: I discussed this case with Dr. McLean soon after the patient visit. I have read the documentation and I agree with the plan of care. Please see the resident note for details of management. 

## 2013-05-25 ENCOUNTER — Encounter: Payer: Medicare Other | Admitting: Internal Medicine

## 2013-06-01 ENCOUNTER — Ambulatory Visit (INDEPENDENT_AMBULATORY_CARE_PROVIDER_SITE_OTHER): Payer: Medicare Other | Admitting: Internal Medicine

## 2013-06-01 ENCOUNTER — Ambulatory Visit (HOSPITAL_COMMUNITY): Admission: RE | Admit: 2013-06-01 | Payer: Medicare Other | Source: Ambulatory Visit

## 2013-06-01 ENCOUNTER — Other Ambulatory Visit (HOSPITAL_COMMUNITY)
Admission: RE | Admit: 2013-06-01 | Discharge: 2013-06-01 | Disposition: A | Discharge status: Home / Self Care | Payer: Medicare Other | Source: Ambulatory Visit | Attending: Internal Medicine | Admitting: Internal Medicine

## 2013-06-01 ENCOUNTER — Encounter: Payer: Self-pay | Admitting: Internal Medicine

## 2013-06-01 VITALS — BP 131/85 | HR 81 | Temp 96.9°F | Ht 66.75 in | Wt 327.0 lb

## 2013-06-01 DIAGNOSIS — Z1151 Encounter for screening for human papillomavirus (HPV): Secondary | ICD-10-CM | POA: Insufficient documentation

## 2013-06-01 DIAGNOSIS — E785 Hyperlipidemia, unspecified: Secondary | ICD-10-CM

## 2013-06-01 DIAGNOSIS — I10 Essential (primary) hypertension: Secondary | ICD-10-CM | POA: Diagnosis not present

## 2013-06-01 DIAGNOSIS — R809 Proteinuria, unspecified: Secondary | ICD-10-CM | POA: Diagnosis not present

## 2013-06-01 DIAGNOSIS — R0602 Shortness of breath: Secondary | ICD-10-CM | POA: Diagnosis not present

## 2013-06-01 DIAGNOSIS — Z01419 Encounter for gynecological examination (general) (routine) without abnormal findings: Secondary | ICD-10-CM | POA: Diagnosis not present

## 2013-06-01 DIAGNOSIS — R600 Localized edema: Secondary | ICD-10-CM

## 2013-06-01 DIAGNOSIS — Z113 Encounter for screening for infections with a predominantly sexual mode of transmission: Secondary | ICD-10-CM | POA: Diagnosis not present

## 2013-06-01 DIAGNOSIS — R609 Edema, unspecified: Secondary | ICD-10-CM

## 2013-06-01 DIAGNOSIS — Z0000 Encounter for general adult medical examination without abnormal findings: Secondary | ICD-10-CM | POA: Diagnosis not present

## 2013-06-01 DIAGNOSIS — N951 Menopausal and female climacteric states: Secondary | ICD-10-CM | POA: Diagnosis not present

## 2013-06-01 DIAGNOSIS — N760 Acute vaginitis: Secondary | ICD-10-CM | POA: Insufficient documentation

## 2013-06-01 LAB — HEPATIC FUNCTION PANEL
ALT: 21 U/L (ref 0–35)
AST: 17 U/L (ref 0–37)
Alkaline Phosphatase: 102 U/L (ref 39–117)
Bilirubin, Direct: 0.1 mg/dL (ref 0.0–0.3)
Indirect Bilirubin: 0.5 mg/dL (ref 0.0–0.9)
Total Bilirubin: 0.6 mg/dL (ref 0.3–1.2)

## 2013-06-01 MED ORDER — MECLIZINE HCL 25 MG PO TABS: 25.0000 mg | ORAL_TABLET | Freq: Three times a day (TID) | ORAL | Status: DC | PRN

## 2013-06-01 MED ORDER — AMLODIPINE BESYLATE 5 MG PO TABS: 5.0000 mg | ORAL_TABLET | Freq: Every day | ORAL | Status: DC

## 2013-06-01 NOTE — Progress Notes (Signed)
  Subjective:    Patient ID: Amy Jarvis, female    DOB: 05-Jul-1961, 52 y.o.   MRN: [id number]  HPI Comments: 52 y.o established clinic patient with multiple problems presents for annual exam.  HTN (BP 131/85 this am, HR 81, h/o proteinuria.  She also had irregular menstrual bleeding noted at previous visits but reports she has not bleed since 03/2013.  She thinks she is going through the changes.  She wants a Rx refill of Meclizine.  She reports chronic leg swelling but leg swelling (L>R) is worse in the am and evenings and she has trouble putting her shoes on.  She denies calf pain.  She is still getting muscles cramps intermittently.  She declines cholesterol medications though LDL 181 due to previous used and she states it caused muscle aches.  She reports sob at rest and with exertion.  Last episode was 06/01/13 in the am at rest.     She has sob at rest and with exertion intermittently.  She is not a smoker but does have OSA on cpap and is obese.       Review of Systems  Constitutional:       Energy improved   Respiratory: Positive for shortness of breath.   Cardiovascular: Positive for leg swelling. Negative for chest pain.  Gastrointestinal: Negative for constipation.  Neurological: Negative for light-headedness.       Objective:   Physical Exam  Nursing note and vitals reviewed. Constitutional: She is oriented to person, place, and time. Vital signs are normal. She appears well-developed and well-nourished. She is cooperative. No distress.  HENT:  Head: Normocephalic and atraumatic.  Mouth/Throat: Oropharynx is clear and moist and mucous membranes are normal. No oropharyngeal exudate.  Eyes: Conjunctivae are normal. Pupils are equal, round, and reactive to light. Right eye exhibits no discharge. Left eye exhibits no discharge. No scleral icterus.  Cardiovascular: Normal rate, regular rhythm, S1 normal, S2 normal and normal heart sounds.   No murmur heard. 2+ edema lower  extremities L>R  Pulmonary/Chest: Effort normal and breath sounds normal. No respiratory distress. She has no wheezes.  Normal b/l breast exam   Abdominal: Soft. Bowel sounds are normal. There is no tenderness.  Obese abdomen  Genitourinary: Pelvic exam was performed with patient supine. No vaginal discharge found.    Neurological: She is alert and oriented to person, place, and time. Gait normal.  Skin: Skin is warm, dry and intact. No rash noted. She is not diaphoretic.  Psychiatric: She has a normal mood and affect. Her speech is normal and behavior is normal. Judgment and thought content normal. Cognition and memory are normal.          Assessment & Plan:  Pick up your medications from the pharmacy  Continue taking medications as prescribed  Return in 4-6  Months sooner if needed

## 2013-06-01 NOTE — Progress Notes (Signed)
Knee hi TED mailed to pt - length 19" and calf 18". [facility] Kidney Brihana Quickel RN 06/01/13 2PM

## 2013-06-01 NOTE — Patient Instructions (Addendum)
General Instructions: Pick up your medications from the pharmacy  Continue taking medications as prescribed  Return in 4-6  Months sooner if needed     Muscle Cramps Muscle cramps are due to sudden involuntary muscle contraction. This means you have no control over the tightening of a muscle (or muscles). Often there are no obvious causes. Muscle cramps may occur with overexertion. They may also occur with chilling of the muscles. An example of a muscle chilling activity is swimming. It is uncommon for cramps to be due to a serious underlying disorder. In most cases, muscle cramps improve (or leave) within minutes. CAUSES  Some common causes are:  Injury.  Infections, especially viral.  Abnormal levels of the salts and ions in your blood (electrolytes). This could happen if you are taking water pills (diuretics).  Blood vessel disease where not enough blood is getting to the muscles (intermittent claudication). Some uncommon causes are:  Side effects of some medicine (such as lithium).  Alcohol abuse.  Diseases where there is soreness (inflammation) of the muscular system. HOME CARE INSTRUCTIONS   It may be helpful to massage, stretch, and relax the affected muscle.  Taking a dose of over-the-counter diphenhydramine is helpful for night leg cramps. SEEK MEDICAL CARE IF:  Cramps are frequent and not relieved with medicine. MAKE SURE YOU:   Understand these instructions.  Will watch your condition.  Will get help right away if you are not doing well or get worse. Document Released: 05/08/2002 Document Revised: 02/08/2012 Document Reviewed: 11/07/2008 [facility] Patient Information 2014 [place], [place].  Fat and Cholesterol Control Diet Cholesterol levels in your body are determined significantly by your diet. Cholesterol levels may also be related to heart disease. The following material helps to explain this relationship and discusses what you can do to help keep your heart  healthy. Not all cholesterol is bad. Low-density lipoprotein (LDL) cholesterol is the "bad" cholesterol. It may cause fatty deposits to build up inside your arteries. High-density lipoprotein (HDL) cholesterol is "good." It helps to remove the "bad" LDL cholesterol from your blood. Cholesterol is a very important risk factor for heart disease. Other risk factors are high blood pressure, smoking, stress, heredity, and weight. The heart muscle gets its supply of blood through the coronary arteries. If your LDL cholesterol is high and your HDL cholesterol is low, you are at risk for having fatty deposits build up in your coronary arteries. This leaves less room through which blood can flow. Without sufficient blood and oxygen, the heart muscle cannot function properly and you may feel chest pains (angina pectoris). When a coronary artery closes up entirely, a part of the heart muscle may die causing a heart attack (myocardial infarction). CHECKING CHOLESTEROL When your caregiver sends your blood to a lab to be examined for cholesterol, a complete lipid (fat) profile may be done. With this test, the total amount of cholesterol and levels of LDL and HDL are determined. Triglycerides are a type of fat that circulates in the blood. They can also be used to determine heart disease risk. The list below describes what the numbers should be: Test: Total Cholesterol.  Less than 200 mg/dl. Test: LDL "bad cholesterol."  Less than 100 mg/dl.  Less than 70 mg/dl if you are at very high risk of a heart attack or sudden cardiac death. Test: HDL "good cholesterol."  Greater than 50 mg/dl for women.  Greater than 40 mg/dl for men. Test: Triglycerides.  Less than 150 mg/dl. CONTROLLING CHOLESTEROL WITH  DIET Although exercise and lifestyle factors are important, your diet is key. That is because certain foods are known to raise cholesterol and others to lower it. The goal is to balance foods for their effect on  cholesterol and more importantly, to replace saturated and trans fat with other types of fat, such as monounsaturated fat, polyunsaturated fat, and omega-3 fatty acids. On average, a person should consume no more than 15 to 17 g of saturated fat daily. Saturated and trans fats are considered "bad" fats, and they will raise LDL cholesterol. Saturated fats are primarily found in animal products such as meats, butter, and cream. However, that does not mean you need to give up all your favorite foods. Today, there are good tasting, low-fat, low-cholesterol substitutes for most of the things you like to eat. Choose low-fat or nonfat alternatives. Choose round or loin cuts of red meat. These types of cuts are lowest in fat and cholesterol. Chicken (without the skin), fish, veal, and ground [place] breast are great choices. Eliminate fatty meats, such as hot dogs and salami. Even shellfish have little or no saturated fat. Have a 3 oz (85 g) portion when you eat lean meat, poultry, or fish. Trans fats are also called "partially hydrogenated oils." They are oils that have been scientifically manipulated so that they are solid at room temperature resulting in a longer shelf life and improved taste and texture of foods in which they are added. Trans fats are found in stick margarine, some tub margarines, cookies, crackers, and baked goods.  When baking and cooking, oils are a great substitute for butter. The monounsaturated oils are especially beneficial since it is believed they lower LDL and raise HDL. The oils you should avoid entirely are saturated tropical oils, such as coconut and palm.  Remember to eat a lot from food groups that are naturally free of saturated and trans fat, including fish, fruit, vegetables, beans, grains (barley, rice, couscous, bulgur wheat), and pasta (without cream sauces).  IDENTIFYING FOODS THAT LOWER CHOLESTEROL  Soluble fiber may lower your cholesterol. This type of fiber is found in  fruits such as apples, vegetables such as broccoli, potatoes, and carrots, legumes such as beans, peas, and lentils, and grains such as barley. Foods fortified with plant sterols (phytosterol) may also lower cholesterol. You should eat at least 2 g per day of these foods for a cholesterol lowering effect.  Read package labels to identify low-saturated fats, trans fat free, and low-fat foods at the supermarket. Select cheeses that have only 2 to 3 g saturated fat per ounce. Use a heart-healthy tub margarine that is free of trans fats or partially hydrogenated oil. When buying baked goods (cookies, crackers), avoid partially hydrogenated oils. Breads and muffins should be made from whole grains (whole-wheat or whole oat flour, instead of "flour" or "enriched flour"). Buy non-creamy canned soups with reduced salt and no added fats.  FOOD PREPARATION TECHNIQUES  Never deep-fry. If you must fry, either stir-fry, which uses very little fat, or use non-stick cooking sprays. When possible, broil, bake, or roast meats, and steam vegetables. Instead of putting butter or margarine on vegetables, use lemon and herbs, applesauce, and cinnamon (for squash and sweet potatoes), nonfat yogurt, salsa, and low-fat dressings for salads.  LOW-SATURATED FAT / LOW-FAT FOOD SUBSTITUTES Meats / Saturated Fat (g)  Avoid: Steak, marbled (3 oz/85 g) / 11 g  Choose: Steak, lean (3 oz/85 g) / 4 g  Avoid: Hamburger (3 oz/85 g) / 7 g  Choose: Hamburger, lean (3 oz/85 g) / 5 g  Avoid: Ham (3 oz/85 g) / 6 g  Choose: Ham, lean cut (3 oz/85 g) / 2.4 g  Avoid: Chicken, with skin, dark meat (3 oz/85 g) / 4 g  Choose: Chicken, skin removed, dark meat (3 oz/85 g) / 2 g  Avoid: Chicken, with skin, light meat (3 oz/85 g) / 2.5 g  Choose: Chicken, skin removed, light meat (3 oz/85 g) / 1 g Dairy / Saturated Fat (g)  Avoid: Whole milk (1 cup) / 5 g  Choose: Low-fat milk, 2% (1 cup) / 3 g  Choose: Low-fat milk, 1% (1 cup) / 1.5  g  Choose: Skim milk (1 cup) / 0.3 g  Avoid: Hard cheese (1 oz/28 g) / 6 g  Choose: Skim milk cheese (1 oz/28 g) / 2 to 3 g  Avoid: Cottage cheese, 4% fat (1 cup) / 6.5 g  Choose: Low-fat cottage cheese, 1% fat (1 cup) / 1.5 g  Avoid: Ice cream (1 cup) / 9 g  Choose: Sherbet (1 cup) / 2.5 g  Choose: Nonfat frozen yogurt (1 cup) / 0.3 g  Choose: Frozen fruit bar / trace  Avoid: Whipped cream (1 tbs) / 3.5 g  Choose: Nondairy whipped topping (1 tbs) / 1 g Condiments / Saturated Fat (g)  Avoid: Mayonnaise (1 tbs) / 2 g  Choose: Low-fat mayonnaise (1 tbs) / 1 g  Avoid: Butter (1 tbs) / 7 g  Choose: Extra light margarine (1 tbs) / 1 g  Avoid: Coconut oil (1 tbs) / 11.8 g  Choose: Olive oil (1 tbs) / 1.8 g  Choose: Corn oil (1 tbs) / 1.7 g  Choose: Safflower oil (1 tbs) / 1.2 g  Choose: Sunflower oil (1 tbs) / 1.4 g  Choose: Soybean oil (1 tbs) / 2.4 g  Choose: Canola oil (1 tbs) / 1 g Document Released: 11/16/2005 Document Revised: 02/08/2012 Document Reviewed: 05/07/2011 [employer] Patient Information 2014 [place], [place].  Cholesterol Cholesterol is a white, waxy, fat-like protein needed by your body in small amounts. The liver makes all the cholesterol you need. It is carried from the liver by the blood through the blood vessels. Deposits (plaque) may build up on blood vessel walls. This makes the arteries narrower and stiffer. Plaque increases the risk for heart attack and stroke. You cannot feel your cholesterol level even if it is very high. The only way to know is by a blood test to check your lipid (fats) levels. Once you know your cholesterol levels, you should keep a record of the test results. Work with your caregiver to to keep your levels in the desired range. WHAT THE RESULTS MEAN:  Total cholesterol is a rough measure of all the cholesterol in your blood.  LDL is the so-called bad cholesterol. This is the type that deposits cholesterol in the walls of  the arteries. You want this level to be low.  HDL is the good cholesterol because it cleans the arteries and carries the LDL away. You want this level to be high.  Triglycerides are fat that the body can either burn for energy or store. High levels are closely linked to heart disease. DESIRED LEVELS:  Total cholesterol below 200.  LDL below 100 for people at risk, below 70 for very high risk.  HDL above 50 is good, above 60 is best.  Triglycerides below 150. HOW TO LOWER YOUR CHOLESTEROL:  Diet.  Choose fish or white meat chicken  and [place], roasted or baked. Limit fatty cuts of red meat, fried foods, and processed meats, such as sausage and lunch meat.  Eat lots of fresh fruits and vegetables. Choose whole grains, beans, pasta, potatoes and cereals.  Use only small amounts of olive, corn or canola oils. Avoid butter, mayonnaise, shortening or palm kernel oils. Avoid foods with trans-fats.  Use skim/nonfat milk and low-fat/nonfat yogurt and cheeses. Avoid whole milk, cream, ice cream, egg yolks and cheeses. Healthy desserts include angel food cake, ginger snaps, animal crackers, hard candy, popsicles, and low-fat/nonfat frozen yogurt. Avoid pastries, cakes, pies and cookies.  Exercise.  A regular program helps decrease LDL and raises HDL.  Helps with weight control.  Do things that increase your activity level like gardening, walking, or taking the stairs.  Medication.  May be prescribed by your caregiver to help lowering cholesterol and the risk for heart disease.  You may need medicine even if your levels are normal if you have several risk factors. HOME CARE INSTRUCTIONS   Follow your diet and exercise programs as suggested by your caregiver.  Take medications as directed.  Have blood work done when your caregiver feels it is necessary. MAKE SURE YOU:   Understand these instructions.  Will watch your condition.  Will get help right away if you are not doing well  or get worse. Document Released: 08/11/2001 Document Revised: 02/08/2012 Document Reviewed: 02/01/2008 [facility] Patient Information 2014 [place], [place].  Lipid Panel     Component Value Date/Time   CHOL 255* 10/07/2012 1653   TRIG 127 10/07/2012 1653   HDL 49 10/07/2012 1653   CHOLHDL 5.2 10/07/2012 1653   VLDL 25 10/07/2012 1653   LDLCALC 181* 10/07/2012 1653    Hypertension As your heart beats, it forces blood through your arteries. This force is your blood pressure. If the pressure is too high, it is called hypertension (HTN) or high blood pressure. HTN is dangerous because you may have it and not know it. High blood pressure may mean that your heart has to work harder to pump blood. Your arteries may be narrow or stiff. The extra work puts you at risk for heart disease, stroke, and other problems.  Blood pressure consists of two numbers, a higher number over a lower, 110/72, for example. It is stated as "110 over 72." The ideal is below 120 for the top number (systolic) and under 80 for the bottom (diastolic). Write down your blood pressure today. You should pay close attention to your blood pressure if you have certain conditions such as:  Heart failure.  Prior heart attack.  Diabetes  Chronic kidney disease.  Prior stroke.  Multiple risk factors for heart disease. To see if you have HTN, your blood pressure should be measured while you are seated with your arm held at the level of the heart. It should be measured at least twice. A one-time elevated blood pressure reading (especially in the Emergency Department) does not mean that you need treatment. There may be conditions in which the blood pressure is different between your right and left arms. It is important to see your caregiver soon for a recheck. Most people have essential hypertension which means that there is not a specific cause. This type of high blood pressure may be lowered by changing lifestyle factors such  as:  Stress.  Smoking.  Lack of exercise.  Excessive weight.  Drug/tobacco/alcohol use.  Eating less salt. Most people do not have symptoms from high blood pressure until  it has caused damage to the body. Effective treatment can often prevent, delay or reduce that damage. TREATMENT  When a cause has been identified, treatment for high blood pressure is directed at the cause. There are a large number of medications to treat HTN. These fall into several categories, and your caregiver will help you select the medicines that are best for you. Medications may have side effects. You should review side effects with your caregiver. If your blood pressure stays high after you have made lifestyle changes or started on medicines,   Your medication(s) may need to be changed.  Other problems may need to be addressed.  Be certain you understand your prescriptions, and know how and when to take your medicine.  Be sure to follow up with your caregiver within the time frame advised (usually within two weeks) to have your blood pressure rechecked and to review your medications.  If you are taking more than one medicine to lower your blood pressure, make sure you know how and at what times they should be taken. Taking two medicines at the same time can result in blood pressure that is too low. SEEK IMMEDIATE MEDICAL CARE IF:  You develop a severe headache, blurred or changing vision, or confusion.  You have unusual weakness or numbness, or a faint feeling.  You have severe chest or abdominal pain, vomiting, or breathing problems. MAKE SURE YOU:   Understand these instructions.  Will watch your condition.  Will get help right away if you are not doing well or get worse. Document Released: 11/16/2005 Document Revised: 02/08/2012 Document Reviewed: 07/06/2008 [facility] Patient Information 2014 [place], [place].   Treatment Goals:  Goals (1 Years of Data) as of 06/01/13         As of Today  04/03/13 04/03/13 10/19/12 10/07/12     Blood Pressure    . Blood Pressure < 140/90  131/85 169/91 159/84 132/71 157/105     Result Component    . LDL CALC < 130      181      Progress Toward Treatment Goals:  Treatment Goal 06/01/2013  Blood pressure at goal    Self Care Goals & Plans:  Self Care Goal 06/01/2013  Manage my medications take my medicines as prescribed; bring my medications to every visit; refill my medications on time; follow the sick day instructions if I am sick  Monitor my health keep track of my blood pressure  Eat healthy foods eat more vegetables; eat fruit for snacks and desserts; eat baked foods instead of fried foods; eat foods that are low in salt; eat smaller portions  Be physically active take a walk every day; find an activity I enjoy  Meeting treatment goals -       Care Management & Community Referrals:  Referral 06/01/2013  Referrals made for care management support none needed  Referrals made to community resources none

## 2013-06-02 LAB — MICROALBUMIN / CREATININE URINE RATIO
Microalb Creat Ratio: 6.3 mg/g (ref 0.0–30.0)
Microalb, Ur: 0.5 mg/dL (ref 0.00–1.89)

## 2013-06-05 ENCOUNTER — Encounter: Payer: Self-pay | Admitting: Internal Medicine

## 2013-06-05 NOTE — Assessment & Plan Note (Signed)
Fitted for b/l TED hose Will w/u for cardiac etiology. Liver is normal Pending echo Elevate feet

## 2013-06-05 NOTE — Assessment & Plan Note (Signed)
BP Readings from Last 3 Encounters:  06/01/13 131/85  04/03/13 169/91  10/19/12 132/71    Lab Results  Component Value Date   NA 140 04/03/2013   K 4.2 04/03/2013   CREATININE 0.71 04/03/2013    Assessment: Blood pressure control: controlled Progress toward BP goal:  at goal Comments: none  Plan: Medications:  continue current medications Educational resources provided: brochure;handout;video Self management tools provided:   Other plans: none, MicroAlb/Creatinine w/o proteinuria. Continue Cozaar

## 2013-06-05 NOTE — Assessment & Plan Note (Signed)
Patient schedules her own mammograms Advised she was due for one. She will schedule

## 2013-06-05 NOTE — Assessment & Plan Note (Signed)
Lipid Panel     Component Value Date/Time   CHOL 255* 10/07/2012 1653   TRIG 127 10/07/2012 1653   HDL 49 10/07/2012 1653   CHOLHDL 5.2 10/07/2012 1653   VLDL 25 10/07/2012 1653   LDLCALC 181* 10/07/2012 1653   Pt declines statin due to h/o muscle aches

## 2013-06-05 NOTE — Assessment & Plan Note (Addendum)
Previously noted.  Will w/u for cardiac reason.  Pulm not likely source due to asthma (never been diagnosed or COPD-she is not a smoker) Ddx: obesity, cardiac , liver less likely as lfts which were normal, pulmonary HTN as does have OSA on cpap Will order echo to see EF and diastolic function EKG was done today NSR, normal axis and intervals, no ST changes, no LVH, poor R wave progression

## 2013-06-05 NOTE — Addendum Note (Signed)
Addended by: Annett Gula on: 06/05/2013 06:29 PM   Modules accepted: Orders

## 2013-06-05 NOTE — Assessment & Plan Note (Signed)
She has intermittent vaginal bleeding which was last noted by the patient in 03/2013  On pap today she had a lesion coming out of os which was friable.   This could be a cervical polyp as patient was told she had a polyp previously  If irregular bleeding continues we will refer to OB/GYN for further w/u  Pending pap and wet prep results from 7/3

## 2013-06-05 NOTE — Assessment & Plan Note (Signed)
Resolved after addition of Cozaar again Possibly was due to HTN.

## 2013-06-06 ENCOUNTER — Encounter: Payer: Self-pay | Admitting: Internal Medicine

## 2013-06-07 NOTE — Progress Notes (Signed)
I saw and evaluated the patient.  I personally confirmed the key portions of the history and exam documented by Dr. McLean and I reviewed pertinent patient test results.  The assessment, diagnosis, and plan were formulated together and I agree with the documentation in the resident's note.    

## 2013-06-08 NOTE — Progress Notes (Signed)
Pt aware of 2D echo [facility] 06/09/13 10AM - no restrictions. Stanton Kidney Jone Panebianco RN 06/08/13 11:15AM

## 2013-06-09 ENCOUNTER — Ambulatory Visit (HOSPITAL_COMMUNITY)
Admission: RE | Admit: 2013-06-09 | Discharge: 2013-06-09 | Disposition: A | Discharge status: Home / Self Care | Payer: Medicare Other | Source: Ambulatory Visit | Attending: Internal Medicine | Admitting: Internal Medicine

## 2013-06-09 DIAGNOSIS — I10 Essential (primary) hypertension: Secondary | ICD-10-CM | POA: Insufficient documentation

## 2013-06-09 DIAGNOSIS — E785 Hyperlipidemia, unspecified: Secondary | ICD-10-CM | POA: Insufficient documentation

## 2013-06-09 DIAGNOSIS — R609 Edema, unspecified: Secondary | ICD-10-CM | POA: Insufficient documentation

## 2013-06-09 DIAGNOSIS — R0609 Other forms of dyspnea: Secondary | ICD-10-CM | POA: Insufficient documentation

## 2013-06-09 DIAGNOSIS — R0602 Shortness of breath: Secondary | ICD-10-CM

## 2013-06-09 DIAGNOSIS — R600 Localized edema: Secondary | ICD-10-CM

## 2013-06-09 DIAGNOSIS — R0989 Other specified symptoms and signs involving the circulatory and respiratory systems: Secondary | ICD-10-CM | POA: Insufficient documentation

## 2013-06-09 NOTE — Progress Notes (Signed)
  Echocardiogram 2D Echocardiogram has been performed.  Georgian Co 06/09/2013, 12:12 PM

## 2013-06-10 ENCOUNTER — Encounter: Payer: Self-pay | Admitting: Internal Medicine

## 2013-08-10 ENCOUNTER — Other Ambulatory Visit: Payer: Self-pay

## 2013-08-10 DIAGNOSIS — Z1231 Encounter for screening mammogram for malignant neoplasm of breast: Secondary | ICD-10-CM

## 2013-08-14 ENCOUNTER — Telehealth: Payer: Self-pay | Admitting: *Deleted

## 2013-08-14 NOTE — Telephone Encounter (Signed)
Call from pt would like to [facility].  Needs an order for.  Pt said that she has an upcoming Mammogram and would like to have it done the same day.  Angelina Ok, RN 08/14/2013 10:05 AM

## 2013-08-14 NOTE — Telephone Encounter (Signed)
Discussed with patient DEXA scan normally rec age 52. She denies h/o fracture, does not have low BMI, she is younger than 76.  Her mother has a history of fractures.   She denies cigarette/alcohol abuse or use.  At one point she was on steroids for 6 months for back pain.  We will hold on ordering DEXA scan now.    Shirlee Latch MD

## 2013-08-15 ENCOUNTER — Telehealth: Payer: Self-pay | Admitting: Internal Medicine

## 2013-08-15 ENCOUNTER — Other Ambulatory Visit: Payer: Self-pay | Admitting: Internal Medicine

## 2013-08-15 ENCOUNTER — Telehealth: Payer: Self-pay | Admitting: *Deleted

## 2013-08-15 MED ORDER — FUROSEMIDE 20 MG PO TABS: 20.0000 mg | ORAL_TABLET | Freq: Every day | ORAL | Status: DC

## 2013-08-15 NOTE — Telephone Encounter (Signed)
Patient still having lower extremity edema though wearing compression stocking intermittently and elevation and being on HCTZ.  She denies calf pain or evidence of infection.  Will add low dose Lasix 20 mg daily #30 no refills    Shirlee Latch MD

## 2013-08-15 NOTE — Telephone Encounter (Signed)
Pt calls and states she has thought about your conversation 9/15 and she has decided that maybe the lasix would be a good idea, she would prefer a low dose but will leave that to your discretion. She uses [facility] on [address]

## 2013-09-05 ENCOUNTER — Ambulatory Visit
Admission: RE | Admit: 2013-09-05 | Discharge: 2013-09-05 | Disposition: A | Discharge status: Home / Self Care | Payer: Medicare Other | Source: Ambulatory Visit

## 2013-09-05 DIAGNOSIS — Z1231 Encounter for screening mammogram for malignant neoplasm of breast: Secondary | ICD-10-CM

## 2013-09-08 ENCOUNTER — Ambulatory Visit (INDEPENDENT_AMBULATORY_CARE_PROVIDER_SITE_OTHER): Payer: Medicare Other | Admitting: Internal Medicine

## 2013-09-08 ENCOUNTER — Encounter: Payer: Self-pay | Admitting: Internal Medicine

## 2013-09-08 VITALS — BP 155/79 | HR 84 | Temp 99.3°F | Wt 316.0 lb

## 2013-09-08 DIAGNOSIS — J209 Acute bronchitis, unspecified: Secondary | ICD-10-CM | POA: Insufficient documentation

## 2013-09-08 DIAGNOSIS — J069 Acute upper respiratory infection, unspecified: Secondary | ICD-10-CM | POA: Diagnosis not present

## 2013-09-08 NOTE — Assessment & Plan Note (Signed)
Likely viral in etiology. Pt does not meet [facility] Criteria for Strep Rapid antigen testing. Symptoms of hemoptysis, myalgias, fever, sore throat consistent with viral bronchitis. Lungs are clear on exam and hx lacking in significant SOB makes pneumonia less likely on differential. No travel outside [name] makes TB highly unlikely.  1. Advised pt to continue taking Ibuprofen up to 800 mg QID.  2. To relieve sore throat and cough recommended OTC lozenges, chloraseptic spray, or cough syrup. 3. Advised pt to seek care if symptoms worsen over the next week or if she develops chest pain or shortness of breath.

## 2013-09-08 NOTE — Progress Notes (Signed)
Subjective:    Patient ID: Amy Jarvis, female    DOB: 10/22/1961, 52 y.o.   MRN: [id number]  Sore Throat  This is a new problem. The current episode started in the past 7 days. The problem has been unchanged. Neither side of throat is experiencing more pain than the other. Maximum temperature: Subjective fever which has resolved over the last 24 hours. Associated symptoms include coughing, headaches, swollen glands and trouble swallowing. She has had no exposure to strep or mono. She has tried NSAIDs and acetaminophen for the symptoms. The treatment provided mild relief.  Cough This is a new problem. The current episode started in the past 7 days. The problem has been unchanged. The problem occurs every few hours. The cough is productive of blood-tinged sputum. Associated symptoms include chills, a fever, headaches, hemoptysis, myalgias, a sore throat and weight loss. Pertinent negatives include no rash. Nothing aggravates the symptoms. She has tried nothing for the symptoms. There is no history of asthma, bronchiectasis, bronchitis, COPD or emphysema. per chart review   Review of Systems  Constitutional: Positive for fever, chills and weight loss.  HENT: Positive for sore throat and trouble swallowing.   Respiratory: Positive for cough and hemoptysis.   Musculoskeletal: Positive for myalgias.  Skin: Negative for rash.  Neurological: Positive for headaches.      Objective:   Physical Exam  Nursing note and vitals reviewed. Constitutional: She is oriented to person, place, and time. She appears well-developed and well-nourished. No distress.  HENT:  Head: Normocephalic and atraumatic.  Mouth/Throat: Oropharynx is clear and moist. No oropharyngeal exudate.  Eyes: Conjunctivae are normal. Right eye exhibits no discharge. Left eye exhibits no discharge. No scleral icterus.  Neck: Normal range of motion. Neck supple. No tracheal deviation present. No thyromegaly present.  Mild right  anterior cervical adenopathy  Cardiovascular: Normal rate, regular rhythm and normal heart sounds.  Exam reveals no gallop and no friction rub.   No murmur heard. Pulmonary/Chest: Effort normal and breath sounds normal. No stridor. No respiratory distress. She has no wheezes. She has no rales.  Lymphadenopathy:    She has cervical adenopathy.  Neurological: She is alert and oriented to person, place, and time.  Skin: Skin is warm and dry. No rash noted. She is not diaphoretic.  Psychiatric: She has a normal mood and affect. Her behavior is normal. Judgment and thought content normal.      Assessment & Plan:   [name] presents with a sore throat, subjective fevers, cough productive of blood tinged sputum, chills, and generalized aches and pains. This constellation of symptoms is most consistent with an upper respiratory infection that is likely viral in nature complicated by an acute bronchitis. With regards to her concern about a strep throat she only has 2 points on the centor criteria which suggests that a strep screen and antibiotics are not necessary. Where she scored positive on the centor criteria include tender anterior cervical adenopathy and fever. Where she scored negative was presence of a cough and lack of tonsillar exudates.  1) Upper respiratory infection/acute bronchitis: Likely viral in nature. She was encouraged to continue her nonsteroidal anti-inflammatory medications for aches, pains, headaches, and sore throat. She was also encouraged to obtain over-the-counter throat lozenges or sprays to help with the sore throat pain. Finally she was asked to drink plenty of fluids and get plenty of rest. If she does not get better in the next several days she was asked to recall the clinic  to set up another appointment for evaluation.

## 2013-09-08 NOTE — Progress Notes (Signed)
Subjective:     Patient ID: Amy Jarvis, female   DOB: [birth year], 52 y.o.   MRN: [id number]  Sore Throat  Associated symptoms include coughing. Pertinent negatives include no diarrhea.  Cough   HPI This is a 52 yo F w/ hx of HTN presenting w/ fever, sore throat, weakness, and decreased appetite that began this past Wednesday (10/8). Pt has also had cough that produces bloody, tan-colored sputum. Pt states that it is not streaked, but in fact bloody. She coughs approx 2-3x/day, and every time she coughs, it is preceded by a knot-like feeling in her throat. Pt describes her throat pain as sharp, but not as sharp as when she had strep throat in the past. To improve her symptoms, she has taken 800 mg of ibuprofen twice a day with regular doses of Tylenol in between. Ibuprofen has improved her fever, and Tylenol has helped with her throat pain. Last ibuprofen was this AM.  Pt endorses HA, decreased energy, chills w/o rigors, body aches, a little SOB since onset but nothing constant, light-headedness assoc w/ position change, dysphagia w/ solids and liquids, small amount of clear runny nose, some abdominal pain assoc w/ use of NSAIDs w/o food intake, feeling "head pressure" like she is congested, and worsened vertigo over the past two days that occurs when she is sitting.   She denies N/V, dizziness, chest pain/pressure.  Pt has prior hx of vertigo. She denies having anything specifically like this before, although she notes the body aches are similar to last year when she got the flu. She denies any sick contacts. No travel outside of [name].   Pt has only had ginger ale and water today. Denies tobacco, alcohol, or illicit drug use.  Review of Systems  Respiratory: Positive for cough.   Gastrointestinal: Negative for diarrhea and blood in stool.       Decreased BMs, but believes it is related to not eating much  Genitourinary: Negative for dysuria, frequency, hematuria and difficulty urinating.   Rest of ROS as per HPI    Objective:   Physical Exam  Constitutional: She is oriented to person, place, and time. She appears well-developed and well-nourished. No distress.  HENT:  Head: Normocephalic and atraumatic.  Mouth/Throat: Oropharynx is clear and moist. No oropharyngeal exudate.  No oral lesions or ulcerations noted. Nasal turbinates appeared red w/o significant edema. Scant mucus present. No septal deviation. .  Eyes: EOM are normal. Pupils are equal, round, and reactive to light.  No conjunctival pallor, injection, or icterus.  Neck: Normal range of motion. Neck supple. No tracheal deviation present. No thyromegaly present.  Mobile and tender lymph node palpated in right anterior cervical region. No other lymphadenopathy pre/post auricular, occipital, or supraclavicular.   Cardiovascular: Normal rate, regular rhythm and normal heart sounds.  Exam reveals no gallop and no friction rub.   No murmur heard. 2+ radial pulses bilaterally  Pulmonary/Chest: Effort normal and breath sounds normal. No respiratory distress. She has no wheezes. She has no rales.  Musculoskeletal: Normal range of motion. She exhibits no edema and no tenderness.  Neurological: She is alert and oriented to person, place, and time.  Speaks in full intelligible sentences and answers questions appropriately.  Skin: Skin is warm and dry. No rash noted. She is not diaphoretic. No erythema.   Filed Vitals:   09/08/13 1403  BP: 155/79  Pulse: 84  Temp: 99.3 F (37.4 C)  TempSrc: Oral  Weight: 316 lb (143.337 kg)  SpO2: 98%  Assessment:     This is a non-toxic 52 yo F w/ hx of HTN presenting w/ symptoms consistent w/ acute viral bronchitis, being managed as an outpatient.  Please see problem-oriented charting for full assessment    Plan:     Please see problem-oriented charting for Plan.

## 2013-09-08 NOTE — Patient Instructions (Signed)
It was a pleasure seeing you in clinic today!  You do not have strep throat. We believe what is causing your symptoms is a viral infection causing acute bronchitis. Please continue to take Ibuprofen as needed for fever and sore throat. To help alleviate your throat pain, you may also try over the counter throat lozenges, chloraseptic spray, or cough syrup.  If your symptoms worsen over the next week, you develop high fevers, shortness of breath or chest pain, please come back and see [name].

## 2013-10-04 ENCOUNTER — Telehealth: Payer: Self-pay | Admitting: *Deleted

## 2013-10-04 NOTE — Telephone Encounter (Signed)
Call from pt would like to get a refill on her Nexium.  Pt also said that she is on Losartan 25 mg and would like to go to Losartan 50 mg if possible.  Angelina Ok, RN 10/04/2013 4:16 PM.

## 2013-10-05 ENCOUNTER — Other Ambulatory Visit: Payer: Self-pay

## 2013-10-06 ENCOUNTER — Telehealth: Payer: Self-pay | Admitting: Internal Medicine

## 2013-10-06 ENCOUNTER — Other Ambulatory Visit: Payer: Self-pay | Admitting: Internal Medicine

## 2013-10-06 MED ORDER — ESOMEPRAZOLE MAGNESIUM 40 MG PO CPDR: 40.0000 mg | DELAYED_RELEASE_CAPSULE | Freq: Every day | ORAL | Status: DC

## 2013-10-06 NOTE — Telephone Encounter (Signed)
Talked to pt - Stated she's having frequent urination and BP 160's/80-90's (she has taken it a few times). Also stated she is not taking Lasix d/t going to the BR every 10 minutes.; she took it x1 month only when it was prescribed.

## 2013-10-06 NOTE — Telephone Encounter (Signed)
Pt called and informed to increase Losartan to 50mg  per Dr Shirlee Latch. An appt has been scheduled for 11/16/13 w/Dr Shirlee Latch.

## 2013-10-06 NOTE — Telephone Encounter (Signed)
Ok with patient increasing her Losartan to 50 mg daily if her BP are elevated patient needs f/u in 1 month   Desma Maxim MD

## 2013-10-06 NOTE — Telephone Encounter (Signed)
Nexium was refilled per Dr Shirlee Latch.

## 2013-10-09 ENCOUNTER — Telehealth: Payer: Self-pay | Admitting: *Deleted

## 2013-10-09 NOTE — Telephone Encounter (Signed)
Pt called leaving message that she needed refills on losartan triage nurse called pharmacy and pt has refills, pharmacist will call pt and confirm last pic up dates and refills left

## 2013-10-10 ENCOUNTER — Other Ambulatory Visit: Payer: Self-pay | Admitting: *Deleted

## 2013-10-10 NOTE — Telephone Encounter (Signed)
Please change to cozaar 50 mg - per your note.  Pharmacy needs new Rx.  Please change in EPIC

## 2013-10-11 MED ORDER — LOSARTAN POTASSIUM 50 MG PO TABS: 50.0000 mg | ORAL_TABLET | Freq: Every day | ORAL | Status: DC

## 2013-10-25 ENCOUNTER — Encounter (HOSPITAL_COMMUNITY): Payer: Self-pay

## 2013-10-25 ENCOUNTER — Encounter: Payer: Self-pay | Admitting: Internal Medicine

## 2013-10-25 ENCOUNTER — Ambulatory Visit (INDEPENDENT_AMBULATORY_CARE_PROVIDER_SITE_OTHER): Payer: Medicare Other | Admitting: Internal Medicine

## 2013-10-25 ENCOUNTER — Ambulatory Visit (HOSPITAL_COMMUNITY)
Admission: RE | Admit: 2013-10-25 | Discharge: 2013-10-25 | Disposition: A | Discharge status: Home / Self Care | Payer: Medicare Other | Source: Ambulatory Visit | Attending: Internal Medicine | Admitting: Internal Medicine

## 2013-10-25 VITALS — BP 157/96 | HR 100 | Temp 97.0°F | Ht 65.75 in | Wt 323.2 lb

## 2013-10-25 DIAGNOSIS — D259 Leiomyoma of uterus, unspecified: Secondary | ICD-10-CM

## 2013-10-25 DIAGNOSIS — K429 Umbilical hernia without obstruction or gangrene: Secondary | ICD-10-CM | POA: Insufficient documentation

## 2013-10-25 DIAGNOSIS — Z23 Encounter for immunization: Secondary | ICD-10-CM | POA: Diagnosis not present

## 2013-10-25 DIAGNOSIS — R1031 Right lower quadrant pain: Secondary | ICD-10-CM | POA: Diagnosis not present

## 2013-10-25 DIAGNOSIS — R350 Frequency of micturition: Secondary | ICD-10-CM | POA: Diagnosis not present

## 2013-10-25 DIAGNOSIS — K5730 Diverticulosis of large intestine without perforation or abscess without bleeding: Secondary | ICD-10-CM

## 2013-10-25 DIAGNOSIS — Z9089 Acquired absence of other organs: Secondary | ICD-10-CM | POA: Diagnosis not present

## 2013-10-25 DIAGNOSIS — N814 Uterovaginal prolapse, unspecified: Secondary | ICD-10-CM

## 2013-10-25 DIAGNOSIS — Z0000 Encounter for general adult medical examination without abnormal findings: Secondary | ICD-10-CM

## 2013-10-25 LAB — BASIC METABOLIC PANEL
BUN: 12 mg/dL (ref 6–23)
CO2: 31 mEq/L (ref 19–32)
Calcium: 9.8 mg/dL (ref 8.4–10.5)
Chloride: 103 mEq/L (ref 96–112)
Creat: 0.65 mg/dL (ref 0.50–1.10)
Glucose, Bld: 93 mg/dL (ref 70–99)

## 2013-10-25 LAB — POCT URINALYSIS DIPSTICK
Ketones, UA: NEGATIVE
Leukocytes, UA: NEGATIVE
Nitrite, UA: NEGATIVE
Protein, UA: NEGATIVE
Urobilinogen, UA: 0.2

## 2013-10-25 LAB — CBC
Hemoglobin: 11.3 g/dL — ABNORMAL LOW (ref 12.0–15.0)
MCHC: 32.3 g/dL (ref 30.0–36.0)
MCV: 79.9 fL (ref 78.0–100.0)
Platelets: 380 10*3/uL (ref 150–400)
RBC: 4.38 MIL/uL (ref 3.87–5.11)
RDW: 16.5 % — ABNORMAL HIGH (ref 11.5–15.5)

## 2013-10-25 MED ORDER — KETOROLAC TROMETHAMINE 60 MG/2ML IM SOLN
60.0000 mg | Freq: Once | INTRAMUSCULAR | Status: AC
  Administered 2013-10-25: 60 mg via INTRAMUSCULAR

## 2013-10-25 MED ORDER — IOHEXOL 300 MG/ML  SOLN
100.0000 mL | Freq: Once | INTRAMUSCULAR | Status: AC | PRN
  Administered 2013-10-25: 100 mL via INTRAVENOUS

## 2013-10-25 MED ORDER — ACETAMINOPHEN 500 MG PO TABS: 500.0000 mg | ORAL_TABLET | ORAL | Status: DC | PRN

## 2013-10-25 NOTE — Assessment & Plan Note (Signed)
Flu shot today 

## 2013-10-25 NOTE — Progress Notes (Signed)
Case discussed with Dr. Schooler soon after the resident saw the patient.  We reviewed the resident's history and exam and pertinent patient test results.  I agree with the assessment, diagnosis, and plan of care documented in the resident's note. 

## 2013-10-25 NOTE — Progress Notes (Addendum)
   Subjective:    Patient ID: Amy Jarvis, female    DOB: 1961/11/18, 52 y.o.   MRN: [id number]  HPI  Presents to acute clinic with complaints of right sided sharp abdominal pain x 3 days.  States that she urinates frequently, denies burning on urination, vaginal discharge, last sex 5 years ago, no fever, nausea or vomiting but has had some loose non-bloody stools. Hx is significant for uterine prolapse, diverticulosis, cystocele, and morbid obesity.   Review of Systems  Constitutional: Negative for fever.  HENT: Negative.   Eyes: Negative.   Respiratory: Negative.   Gastrointestinal: Positive for abdominal pain. Negative for constipation and blood in stool.       Loose stools  Endocrine: Negative.   Genitourinary: Positive for urgency, frequency and pelvic pain. Negative for dysuria, hematuria, flank pain, vaginal bleeding, vaginal discharge, difficulty urinating and menstrual problem.  Musculoskeletal: Negative.   Skin: Negative.   Allergic/Immunologic: Negative.   Neurological: Negative.        Objective:   Physical Exam  Constitutional: She appears well-developed.  Morbidly obese  HENT:  Head: Normocephalic and atraumatic.  Eyes: Conjunctivae and EOM are normal. Pupils are equal, round, and reactive to light.  Cardiovascular: Normal rate.   Pulmonary/Chest: Effort normal.  Abdominal: Soft. She exhibits no mass. There is no hepatosplenomegaly. There is tenderness in the right lower quadrant. There is tenderness at McBurney's point. There is no rigidity, no rebound, no guarding, no CVA tenderness and negative Murphy's sign.  Exam limited by large pannus, TTP near McBurney's pt, positive obturator sign, no Rovsing          Assessment & Plan:  See separate problem list charting:  RLQ abdominal pain: exam limited by obese body habitus, ddx includes but not limited to appendicitis, fibroid pain, diverticulitis, tubo-ovarian abscess, urinary calculi, cystitis or  colitis or complications of uterine prolapse.  Given clinical exam will need to rule out appendicitis foremost. -UA--> stat -CBC--> stat -CT abdomen with contrast r/o appendicitis--> no appendicitis, fibroids noted -will refer to GYN, prefer Dr. Tamela Oddi of [facility] -Toradol 60 mg IM x1  Preventative Medicine: flu shot today

## 2013-10-25 NOTE — Addendum Note (Signed)
Addended by: Manuela Schwartz on: 10/25/2013 03:59 PM   Modules accepted: Orders

## 2013-10-25 NOTE — Patient Instructions (Addendum)
The Scan of your abdomen does NOT show appendicitis. It does show fibroids which may be contributing to your pain. We will refer you to Gynecology for further management. You may take Tylenol for you discomfort.

## 2013-10-25 NOTE — Addendum Note (Signed)
Addended by: Youlanda Roys A on: 10/25/2013 12:26 PM   Modules accepted: Orders

## 2013-10-25 NOTE — Assessment & Plan Note (Addendum)
exam limited by obese body habitus, ddx includes but not limited to appendicitis, fibroid pain, diverticulitis, tubo-ovarian abscess, urinary calculi, cystitis or colitis or complications of uterine prolapse.  Given clinical exam will need to rule out appendicitis foremost. -UA--> stat -CBC--> stat -CT abdomen with contrast r/o appendicitis--> no appendicitis, no diverticulitis, + fibroids with calcification -Toradol 60 mg IM x1 in clinic

## 2013-11-02 ENCOUNTER — Other Ambulatory Visit: Payer: Self-pay | Admitting: Obstetrics & Gynecology

## 2013-11-02 DIAGNOSIS — N841 Polyp of cervix uteri: Secondary | ICD-10-CM | POA: Diagnosis not present

## 2013-11-02 DIAGNOSIS — D259 Leiomyoma of uterus, unspecified: Secondary | ICD-10-CM | POA: Diagnosis not present

## 2013-11-16 ENCOUNTER — Encounter: Payer: Self-pay | Admitting: Internal Medicine

## 2013-11-16 ENCOUNTER — Ambulatory Visit (INDEPENDENT_AMBULATORY_CARE_PROVIDER_SITE_OTHER): Payer: Medicare Other | Admitting: Internal Medicine

## 2013-11-16 VITALS — BP 158/89 | HR 97 | Temp 97.3°F | Ht 67.0 in | Wt 322.1 lb

## 2013-11-16 DIAGNOSIS — R1031 Right lower quadrant pain: Secondary | ICD-10-CM | POA: Diagnosis not present

## 2013-11-16 DIAGNOSIS — K5730 Diverticulosis of large intestine without perforation or abscess without bleeding: Secondary | ICD-10-CM | POA: Diagnosis not present

## 2013-11-16 DIAGNOSIS — D259 Leiomyoma of uterus, unspecified: Secondary | ICD-10-CM | POA: Diagnosis not present

## 2013-11-16 DIAGNOSIS — I10 Essential (primary) hypertension: Secondary | ICD-10-CM | POA: Diagnosis not present

## 2013-11-16 DIAGNOSIS — R32 Unspecified urinary incontinence: Secondary | ICD-10-CM | POA: Insufficient documentation

## 2013-11-16 DIAGNOSIS — R350 Frequency of micturition: Secondary | ICD-10-CM | POA: Diagnosis not present

## 2013-11-16 DIAGNOSIS — Z0000 Encounter for general adult medical examination without abnormal findings: Secondary | ICD-10-CM | POA: Diagnosis not present

## 2013-11-16 LAB — POCT GLYCOSYLATED HEMOGLOBIN (HGB A1C): Hemoglobin A1C: 5.6

## 2013-11-16 LAB — GLUCOSE, CAPILLARY: Glucose-Capillary: 88 mg/dL (ref 70–99)

## 2013-11-16 MED ORDER — LOSARTAN POTASSIUM 100 MG PO TABS: 100.0000 mg | ORAL_TABLET | Freq: Every day | ORAL | Status: DC

## 2013-11-16 MED ORDER — POTASSIUM CHLORIDE ER 20 MEQ PO TBCR: 20.0000 mg | EXTENDED_RELEASE_TABLET | Freq: Every day | ORAL | Status: DC

## 2013-11-16 MED ORDER — ACETAMINOPHEN 500 MG PO TABS: 500.0000 mg | ORAL_TABLET | ORAL | Status: DC | PRN

## 2013-11-16 NOTE — Assessment & Plan Note (Signed)
Pt has increased freq as well.   Pt has had 3 vaginal deliveries in the past which could be stress OB/GYN per pt does not think related to uterine prolapse Will r/o overflow with HA1C which was <6.5-pt was not diabetic.  Explained this test may not be covered by insurance and will cost $30 which pt agreed to pay.  Will d/c Lasix and only continue HCTZ in the am If problem does not resolve consider referral to urology/OB/GYN Discussed kegel exercises.

## 2013-11-16 NOTE — Assessment & Plan Note (Addendum)
BP Readings from Last 3 Encounters:  11/16/13 158/89  10/25/13 157/96  09/08/13 155/79    Lab Results  Component Value Date   NA 142 10/25/2013   K 3.8 10/25/2013   CREATININE 0.65 10/25/2013    Assessment: Blood pressure control: mildly elevated Progress toward BP goal:  deteriorated Comments: intermittently compliant with meds (i.e HCTZ)  Plan: Medications:  continue current medications but increased Cozaar 50-->100 mg, Norvasc 5, HCTZ 25 mg.  D/C Lasix today  Educational resources provided: brochure;video Self management tools provided: home blood pressure logbook Other plans: encouraged compliance. Pt request K refill.  Last K wnl.  Rx refill K (h/o hypokalemia and on diuretic)

## 2013-11-16 NOTE — Assessment & Plan Note (Addendum)
[name] next Weds.  Following with OB/GYN Requests Rx refill of generic Tylenol for relief of pain d/t fibroids

## 2013-11-16 NOTE — Progress Notes (Signed)
   Subjective:    Patient ID: Amy Jarvis, female    DOB: [birth year], 52 y.o.   MRN: [id number]  HPI Comments: 52 y.o established clinic patient with multiple problems presents for annual exam.  HTN (BP 164/88-->159/89), h/o proteinuria, chronic lower extremity edema, muscle cramps, DDD, diverticulosis, fibroids, h/o benign endocervical polyp, GERD, HLD (LDL 181 09/2012), obesity, OSA on cpap, uterine prolapse.    She presents for HTN f/u.  She also c/o increased urinary frequency.  She has a h/o 3 vaginal deliveries.  She states she can urinate q30 min to 1 hour.  She denies dysuria.  She also complains of increased thirst and wonders if she has DM.  Due to sx's she stopped Lasix and intermittently takes HCTZ.  She denies increased fluid intake. Per pt OB/GYN does not think uterine prolapse is contributing that much.   For uterine fibroids she has appt next week for [name].     BP is elevated today.  She intermittently takes HCTZ and takes her BP at night normally.    She requests Rx refill of K.       Review of Systems  Respiratory: Negative for shortness of breath.   Cardiovascular: Negative for chest pain.  Gastrointestinal: Negative for constipation.  Genitourinary: Positive for frequency. Negative for dysuria and vaginal bleeding.  Musculoskeletal:       Denies muscle cramps        Objective:   Physical Exam  Nursing note and vitals reviewed. Constitutional: She is oriented to person, place, and time. She appears well-developed and well-nourished. She is cooperative. No distress.  HENT:  Head: Normocephalic and atraumatic.  Mouth/Throat: Oropharynx is clear and moist and mucous membranes are normal. No oropharyngeal exudate.  Eyes: Conjunctivae are normal. Pupils are equal, round, and reactive to light. Right eye exhibits no discharge. Left eye exhibits no discharge. No scleral icterus.  Cardiovascular: Normal rate, regular rhythm, S1 normal, S2 normal and normal heart  sounds.   No murmur heard. 1+ b/l lower ext edema   Pulmonary/Chest: Effort normal and breath sounds normal. No respiratory distress. She has no wheezes.  Abdominal: Soft. Bowel sounds are normal. There is no tenderness.  Obese ab  Neurological: She is alert and oriented to person, place, and time. Gait normal.  Skin: Skin is warm, dry and intact. No rash noted. She is not diaphoretic.  Psychiatric: She has a normal mood and affect. Her speech is normal and behavior is normal. Judgment and thought content normal. Cognition and memory are normal.          Assessment & Plan:  F/u in 4-6 months sooner if needed

## 2013-11-16 NOTE — Patient Instructions (Addendum)
General Instructions: Take Losartan 100 mg daily, in addition to HCTZ 25 mg and Norvasc 5 mg Please fax information to [phone] Internal Medicine  Please pick up medications from the pharmacy  Read the information below Follow up 4-6 months.     Treatment Goals:  Goals (1 Years of Data) as of 11/16/13         As of Today As of Today 10/25/13 09/08/13 06/01/13     Blood Pressure    . Blood Pressure < 140/90  158/89 164/88 157/96 155/79 131/85     Result Component    . LDL CALC < 130            Progress Toward Treatment Goals:  Treatment Goal 11/16/2013  Blood pressure deteriorated    Self Care Goals & Plans:  Self Care Goal 11/16/2013  Manage my medications take my medicines as prescribed; bring my medications to every visit; refill my medications on time  Monitor my health keep track of my blood pressure; bring my blood pressure log to each visit  Eat healthy foods drink diet soda or water instead of juice or soda; eat more vegetables; eat foods that are low in salt; eat baked foods instead of fried foods; eat fruit for snacks and desserts; eat smaller portions  Be physically active find an activity I enjoy  Meeting treatment goals maintain the current self-care plan    No flowsheet data found.   Care Management & Community Referrals:  Referral 11/16/2013  Referrals made for care management support none needed  Referrals made to community resources none        Urinary Incontinence Urinary incontinence is the involuntary loss of urine from your bladder. CAUSES  There are many causes of urinary incontinence. They include:  Medicines.  Infections.  Prostatic enlargement, leading to overflow of urine from your bladder.  Surgery.  Neurological diseases.  Emotional factors. SIGNS AND SYMPTOMS Urinary Incontinence can be divided into four types: 1. Urge incontinence Urge incontinence is the involuntary loss of urine before you have the opportunity to go to  the bathroom. There is an sudden urge to void but not enough time to reach a bathroom. 2. Stress incontinence Stress incontinence is the sudden loss of urine with any activity that forces urine to pass. It is commonly caused by anatomical changes to the pelvis and sphincter areas of your body. 3. Overflow incontinence Overflow incontinence is the loss of urine from an obstructed opening to your bladder. This results in a backup of urine and a resultant buildup of pressure within the bladder. When the pressure within the bladder exceeds the closing pressure of the sphincter, the urine overflows, which causes incontinence, similar to water overflowing a dam. 4. Total incontinence Total incontinence is the loss of urine as a result of the inability to store urine within your bladder. DIAGNOSIS  Evaluating the cause of incontinence may require:  A thorough and complete medical and obstetric history.  A complete physical exam.  Laboratory tests such as a urine culture and sensitivities. When additional tests are indicated, they can include:  An ultrasound exam.  Kidney and bladder X-rays.  Cystoscopy. This is an exam of the bladder using a narrow scope.  Urodynamic testing to test the nerve function to the bladder and sphincter areas. TREATMENT  Treatment for urinary incontinence depends on the cause:  For urge incontinence caused by a bacterial infection, antibiotics will be prescribed. If the urge incontinence is related to medicines you take, your  health care provider may have you change the medicine.  For stress incontinence, surgery to re-establish anatomical support to the bladder or sphincter, or both, will often correct the condition.  For overflow incontinence caused by an enlarged prostate, an operation to open the channel through the enlarged prostate will allow the flow of urine out of the bladder. In women with fibroids, a hysterectomy may be recommended.  For total  incontinence, surgery on your urinary sphincter may help. An artificial urinary sphincter (an inflatable cuff placed around the urethra) may be required. In women who have developed a hole-like passage between their bladder and vagina (vesicovaginal fistula), surgery to close the fistula often is required. HOME CARE INSTRUCTIONS  Normal daily hygiene and the use of pads or adult diapers that are changed regularly will help prevent odors and skin damage.  Avoid caffeine. It can overstimulate your bladder.  Use the bathroom regularly. Try about every 2 3 hours to go to the bathroom, even if you do not feel the need to do so. Take time to empty your bladder completely. After urinating, wait a minute. Then try to urinate again.  For causes involving nerve dysfunction, keep a log of the medicines you take and a journal of the times you go to the bathroom. SEEK MEDICAL CARE IF:  You experience worsening of pain instead of improvement in pain after your procedure.  Your incontinence becomes worse instead of better. SEE IMMEDIATE MEDICAL CARE IF:  You experience fever or shaking chills.  You are unable to pass your urine.  You have redness spreading into your groin or down into your thighs. MAKE SURE YOU:   Understand these instructions.   Will watch your condition.  Will get help right away if you are not doing well or get worse. Document Released: 12/24/2004 Document Revised: 07/19/2013 Document Reviewed: 04/25/2013 [facility] Patient Information 2014 [place], [place].   Kegel Exercises The goal of Kegel exercises is to isolate and exercise your pelvic floor muscles. These muscles act as a hammock that supports the rectum, vagina, small intestine, and uterus. As the muscles weaken, the hammock sags and these organs are displaced from their normal positions. Kegel exercises can strengthen your pelvic floor muscles and help you to improve bladder and bowel control, improve sexual response,  and help reduce many problems and some discomfort during pregnancy. Kegel exercises can be done anywhere and at any time. HOW TO PERFORM KEGEL EXERCISES 1. Locate your pelvic floor muscles. To do this, squeeze (contract) the muscles that you use when you try to stop the flow of urine. You will feel a tightness in the vaginal area (women) and a tight lift in the rectal area (men and women). 2. When you begin, contract your pelvic muscles tight for 2 5 seconds, then relax them for 2 5 seconds. This is one set. Do 4 5 sets with a short pause in between. 3. Contract your pelvic muscles for 8 10 seconds, then relax them for 8 10 seconds. Do 4 5 sets. If you cannot contract your pelvic muscles for 8 10 seconds, try 5 7 seconds and work your way up to 8 10 seconds. Your goal is 4 5 sets of 10 contractions each day. Keep your stomach, buttocks, and legs relaxed during the exercises. Perform sets of both short and long contractions. Vary your positions. Perform these contractions 3 4 times per day. Perform sets while you are:   Lying in bed in the morning.  Standing at lunch.  Sitting in the late afternoon.  Lying in bed at night. You should do 40 50 contractions per day. Do not perform more Kegel exercises per day than recommended. Overexercising can cause muscle fatigue. Continue these exercises for for at least 15 20 weeks or as directed by your caregiver. Document Released: 11/02/2012 Document Reviewed: 11/02/2012 [facility] Patient Information 2014 [place], [place].   Fibroids Fibroids are lumps (tumors) that can occur any place in a woman's body. These lumps are not cancerous. Fibroids vary in size, weight, and where they grow. HOME CARE  Do not take aspirin.  Write down the number of pads or tampons you use during your period. Tell your doctor. This can help determine the best treatment for you. GET HELP RIGHT AWAY IF:  You have pain in your lower belly (abdomen) that is not helped with  medicine.  You have cramps that are not helped with medicine.  You have more bleeding between or during your period.  You feel lightheaded or pass out (faint).  Your lower belly pain gets worse. MAKE SURE YOU:  Understand these instructions.  Will watch your condition.  Will get help right away if you are not doing well or get worse. Document Released: 12/19/2010 Document Revised: 02/08/2012 Document Reviewed: 12/19/2010 [facility] Patient Information 2014 [place], [place].   DASH Diet The DASH diet stands for "Dietary Approaches to Stop Hypertension." It is a healthy eating plan that has been shown to reduce high blood pressure (hypertension) in as little as 14 days, while also possibly providing other significant health benefits. These other health benefits include reducing the risk of breast cancer after menopause and reducing the risk of type 2 diabetes, heart disease, colon cancer, and stroke. Health benefits also include weight loss and slowing kidney failure in patients with chronic kidney disease.  DIET GUIDELINES  Limit salt (sodium). Your diet should contain less than 1500 mg of sodium daily.  Limit refined or processed carbohydrates. Your diet should include mostly whole grains. Desserts and added sugars should be used sparingly.  Include small amounts of heart-healthy fats. These types of fats include nuts, oils, and tub margarine. Limit saturated and trans fats. These fats have been shown to be harmful in the body. CHOOSING FOODS  The following food groups are based on a 2000 calorie diet. See your Registered Dietitian for individual calorie needs. Grains and Grain Products (6 to 8 servings daily)  Eat More Often: Whole-wheat bread, brown rice, whole-grain or wheat pasta, quinoa, popcorn without added fat or salt (air popped).  Eat Less Often: White bread, white pasta, white rice, cornbread. Vegetables (4 to 5 servings daily)  Eat More Often: Fresh, frozen, and canned  vegetables. Vegetables may be raw, steamed, roasted, or grilled with a minimal amount of fat.  Eat Less Often/Avoid: Creamed or fried vegetables. Vegetables in a cheese sauce. Fruit (4 to 5 servings daily)  Eat More Often: All fresh, canned (in natural juice), or frozen fruits. Dried fruits without added sugar. One hundred percent fruit juice ( cup [237 mL] daily).  Eat Less Often: Dried fruits with added sugar. Canned fruit in light or heavy syrup. [employer], Fish, and Poultry (2 servings or less daily. One serving is 3 to 4 oz [85-114 g]).  Eat More Often: Ninety percent or leaner ground beef, tenderloin, sirloin. Round cuts of beef, chicken breast, [place] breast. All fish. Grill, bake, or broil your meat. Nothing should be fried.  Eat Less Often/Avoid: Fatty cuts of meat, [place], or chicken  leg, thigh, or wing. Fried cuts of meat or fish. Dairy (2 to 3 servings)  Eat More Often: Low-fat or fat-free milk, low-fat plain or light yogurt, reduced-fat or part-skim cheese.  Eat Less Often/Avoid: Milk (whole, 2%).Whole milk yogurt. Full-fat cheeses. Nuts, Seeds, and Legumes (4 to 5 servings per week)  Eat More Often: All without added salt.  Eat Less Often/Avoid: Salted nuts and seeds, canned beans with added salt. Fats and Sweets (limited)  Eat More Often: Vegetable oils, tub margarines without trans fats, sugar-free gelatin. Mayonnaise and salad dressings.  Eat Less Often/Avoid: Coconut oils, palm oils, butter, stick margarine, cream, half and half, cookies, candy, pie. FOR MORE INFORMATION The Dash Diet Eating Plan: www.dashdiet.org Document Released: 11/05/2011 Document Revised: 02/08/2012 Document Reviewed: 11/05/2011 [facility] Patient Information 2014 [place], [place].  Hypertension As your heart beats, it forces blood through your arteries. This force is your blood pressure. If the pressure is too high, it is called hypertension (HTN) or high blood pressure. HTN is dangerous  because you may have it and not know it. High blood pressure may mean that your heart has to work harder to pump blood. Your arteries may be narrow or stiff. The extra work puts you at risk for heart disease, stroke, and other problems.  Blood pressure consists of two numbers, a higher number over a lower, 110/72, for example. It is stated as "110 over 72." The ideal is below 120 for the top number (systolic) and under 80 for the bottom (diastolic). Write down your blood pressure today. You should pay close attention to your blood pressure if you have certain conditions such as:  Heart failure.  Prior heart attack.  Diabetes  Chronic kidney disease.  Prior stroke.  Multiple risk factors for heart disease. To see if you have HTN, your blood pressure should be measured while you are seated with your arm held at the level of the heart. It should be measured at least twice. A one-time elevated blood pressure reading (especially in the Emergency Department) does not mean that you need treatment. There may be conditions in which the blood pressure is different between your right and left arms. It is important to see your caregiver soon for a recheck. Most people have essential hypertension which means that there is not a specific cause. This type of high blood pressure may be lowered by changing lifestyle factors such as:  Stress.  Smoking.  Lack of exercise.  Excessive weight.  Drug/tobacco/alcohol use.  Eating less salt. Most people do not have symptoms from high blood pressure until it has caused damage to the body. Effective treatment can often prevent, delay or reduce that damage. TREATMENT  When a cause has been identified, treatment for high blood pressure is directed at the cause. There are a large number of medications to treat HTN. These fall into several categories, and your caregiver will help you select the medicines that are best for you. Medications may have side effects. You  should review side effects with your caregiver. If your blood pressure stays high after you have made lifestyle changes or started on medicines,   Your medication(s) may need to be changed.  Other problems may need to be addressed.  Be certain you understand your prescriptions, and know how and when to take your medicine.  Be sure to follow up with your caregiver within the time frame advised (usually within two weeks) to have your blood pressure rechecked and to review your medications.  If you  are taking more than one medicine to lower your blood pressure, make sure you know how and at what times they should be taken. Taking two medicines at the same time can result in blood pressure that is too low. SEEK IMMEDIATE MEDICAL CARE IF:  You develop a severe headache, blurred or changing vision, or confusion.  You have unusual weakness or numbness, or a faint feeling.  You have severe chest or abdominal pain, vomiting, or breathing problems. MAKE SURE YOU:   Understand these instructions.  Will watch your condition.  Will get help right away if you are not doing well or get worse. Document Released: 11/16/2005 Document Revised: 02/08/2012 Document Reviewed: 07/06/2008 [facility] Patient Information 2014 [place], [place].

## 2013-11-16 NOTE — Progress Notes (Signed)
Case discussed with Dr. McLean at the time of the visit.  We reviewed the resident's history and exam and pertinent patient test results.  I agree with the assessment, diagnosis, and plan of care documented in the resident's note.     

## 2013-11-22 DIAGNOSIS — D259 Leiomyoma of uterus, unspecified: Secondary | ICD-10-CM | POA: Diagnosis not present

## 2013-11-22 DIAGNOSIS — N8500 Endometrial hyperplasia, unspecified: Secondary | ICD-10-CM | POA: Diagnosis not present

## 2013-11-27 ENCOUNTER — Telehealth: Payer: Self-pay | Admitting: *Deleted

## 2013-11-27 NOTE — Telephone Encounter (Signed)
Message left per pt - stated PA is needed for Nexium and the telephone # to call is [phone]. I will send request to Lynn Ito. Thanks

## 2013-12-21 ENCOUNTER — Encounter (HOSPITAL_COMMUNITY): Payer: Self-pay | Admitting: Pharmacist

## 2013-12-25 DIAGNOSIS — N840 Polyp of corpus uteri: Secondary | ICD-10-CM | POA: Diagnosis not present

## 2013-12-27 ENCOUNTER — Encounter (HOSPITAL_COMMUNITY): Payer: Self-pay

## 2013-12-27 ENCOUNTER — Encounter (HOSPITAL_COMMUNITY)
Admission: RE | Admit: 2013-12-27 | Discharge: 2013-12-27 | Disposition: A | Discharge status: Home / Self Care | Payer: Medicare Other | Source: Ambulatory Visit | Attending: Obstetrics & Gynecology | Admitting: Obstetrics & Gynecology

## 2013-12-27 ENCOUNTER — Inpatient Hospital Stay (HOSPITAL_COMMUNITY): Admission: RE | Admit: 2013-12-27 | Payer: Medicare Other | Source: Ambulatory Visit

## 2013-12-27 DIAGNOSIS — Z01818 Encounter for other preprocedural examination: Secondary | ICD-10-CM | POA: Diagnosis not present

## 2013-12-27 DIAGNOSIS — Z01812 Encounter for preprocedural laboratory examination: Secondary | ICD-10-CM | POA: Diagnosis not present

## 2013-12-27 LAB — CBC
HEMATOCRIT: 35.9 % — AB (ref 36.0–46.0)
HEMOGLOBIN: 11.7 g/dL — AB (ref 12.0–15.0)
MCH: 25.9 pg — ABNORMAL LOW (ref 26.0–34.0)
MCHC: 32.6 g/dL (ref 30.0–36.0)
MCV: 79.6 fL (ref 78.0–100.0)
Platelets: 329 10*3/uL (ref 150–400)
RBC: 4.51 MIL/uL (ref 3.87–5.11)
RDW: 16.2 % — ABNORMAL HIGH (ref 11.5–15.5)
WBC: 6.4 10*3/uL (ref 4.0–10.5)

## 2013-12-27 LAB — BASIC METABOLIC PANEL
BUN: 13 mg/dL (ref 6–23)
CHLORIDE: 98 meq/L (ref 96–112)
CO2: 29 mEq/L (ref 19–32)
CREATININE: 0.7 mg/dL (ref 0.50–1.10)
Calcium: 10.2 mg/dL (ref 8.4–10.5)
GFR calc Af Amer: >90 mL/min (ref >90)
GFR calc non Af Amer: >90 mL/min (ref >90)
GLUCOSE: 86 mg/dL (ref 70–99)
POTASSIUM: 3.7 meq/L (ref 3.7–5.3)
Sodium: 139 mEq/L (ref 137–147)

## 2013-12-27 NOTE — Patient Instructions (Signed)
Amy Jarvis  12/27/2013   Your procedure is scheduled on:  01/02/14  Enter through the Main Entrance of [facility] up the phone at the desk and dial [phone].   Call this number if you have problems the morning of surgery: [phone]   Remember:   Do not eat food:After Midnight.  Do not drink clear liquids: After Midnight.  Take these medicines the morning of surgery with A SIP OF WATER: nexium   Do not wear jewelry, make-up or nail polish.  Do not wear lotions, powders, or perfumes. You may wear deodorant.  Do not shave 48 hours prior to surgery.  Do not bring valuables to the hospital.  [facility] is not   responsible for any belongings or valuables brought to the hospital.  Contacts, dentures or bridgework may not be worn into surgery.  Leave suitcase in the car. After surgery it may be brought to your room.  For patients admitted to the hospital, checkout time is 11:00 AM the day of              discharge.   Patients discharged the day of surgery will not be allowed to drive             home.  Name and phone number of your driver: daughter  Amy Jarvis  Special Instructions:   Shower using CHG 2 nights before surgery and the night before surgery.  If you shower the day of surgery use CHG.  Use special wash - you have one bottle of CHG for all showers.  You should use approximately 1/3 of the bottle for each shower.   Please read over the following fact sheets that you were given:   Surgical Site Infection Prevention

## 2014-01-01 ENCOUNTER — Encounter (HOSPITAL_COMMUNITY): Payer: Self-pay | Admitting: Anesthesiology

## 2014-01-01 NOTE — H&P (Signed)
Amy Jarvis is an 53 y.o. female with abnormal uterine bleeding and thickened endometrial stripe with questionable polyp on ultrasound.  Pertinent Gynecological History: Menses: irregular occurring approximately every n/a days without intermenstrual spotting Bleeding: dysfunctional uterine bleeding Contraception: tubal ligation DES exposure: unknown Blood transfusions: none Sexually transmitted diseases: no past history Previous GYN Procedures: BTL  Last mammogram: normal Date: 2014 Last pap: normal Date: 2014 OB History: G3, P3   Menstrual History: Menarche age: n/a Patient's last menstrual period was 03/31/2013.    Past Medical History  Diagnosis Date  . Hyperlipidemia LDL goal < 130 09/25/2008  . Morbid obesity 08/28/2010  . Obstructive sleep apnea on CPAP 06/20/2010  . Hypertension 09/25/2008  . GERD 09/05/2009  . Diverticulosis 03/26/2010  . Microscopic hematuria 01/13/2010  . Cystocele 01/13/2010  . Uterine prolapse 11/19/2008  . Perimenopausal 01/13/2010  . Vertigo 10/07/2012  . Degenerative disk disease 10/07/2012  . Cervical polyp     possible history of cervical polyp    Past Surgical History  Procedure Laterality Date  . Cholecystectomy  2010  . Rotator cuff repair      left  . Tubal ligation  1992    Family History  Problem Relation Age of Onset  . Colon cancer Mother 53  . Hypertension Mother   . Stroke Mother 73  . Breast cancer Other     maternal great-aunt  . Cancer Other     maternal great aunt with breast cancer    Social History:  reports that she has never smoked. She has never used smokeless tobacco. She reports that she does not drink alcohol or use illicit drugs.  Allergies:  Allergies  Allergen Reactions  . Ace Inhibitors Itching and Swelling    REACTION: itching and swelling - left arm, tingling tongue  . Promethazine Hcl Itching and Swelling    Lip swelling & itching    No prescriptions prior to admission    ROS  Last  menstrual period 03/31/2013. Physical Exam  Constitutional: She is oriented to person, place, and time. She appears well-developed and well-nourished.  GI: Soft. There is no rebound and no guarding.  Neurological: She is alert and oriented to person, place, and time.  Skin: Skin is warm and dry.  Psychiatric: She has a normal mood and affect. Her behavior is normal.    No results found for this or any previous visit (from the past 24 hour(s)).  No results found.  Assessment/Plan: 52yo with abnormal uterine bleeding, thickened endometrial stripe and possible polyp -H/S, D&C  [name] 01/01/2014, 5:14 PM

## 2014-01-01 NOTE — Anesthesia Preprocedure Evaluation (Addendum)
Anesthesia Evaluation  Patient identified by MRN, date of birth, ID band Patient awake    Reviewed: Allergy & Precautions, H&P , NPO status , Patient's Chart, lab work & pertinent test results  Airway Mallampati: III TM Distance: >3 FB Neck ROM: Full    Dental no notable dental hx. (+) Teeth Intact   Pulmonary shortness of breath, with exertion and at rest, sleep apnea and Continuous Positive Airway Pressure Ventilation ,  breath sounds clear to auscultation  Pulmonary exam normal       Cardiovascular hypertension, Pt. on medications Rhythm:Regular Rate:Normal     Neuro/Psych negative neurological ROS  negative psych ROS   GI/Hepatic Neg liver ROS, GERD-  Medicated and Controlled,  Endo/Other  Morbid obesity  Renal/GU negative Renal ROS   Cystocele    Musculoskeletal   Abdominal (+) + obese,   Peds  Hematology  (+) anemia ,   Anesthesia Other Findings   Reproductive/Obstetrics Endometrial polyp Fibroid uterus Uterine prolapse                          Anesthesia Physical Anesthesia Plan  ASA: III  Anesthesia Plan: General   Post-op Pain Management:    Induction: Intravenous  Airway Management Planned: LMA  Additional Equipment:   Intra-op Plan:   Post-operative Plan: Extubation in OR  Informed Consent: I have reviewed the patients History and Physical, chart, labs and discussed the procedure including the risks, benefits and alternatives for the proposed anesthesia with the patient or authorized representative who has indicated his/her understanding and acceptance.   Dental advisory given  Plan Discussed with: Anesthesiologist, CRNA and Surgeon  Anesthesia Plan Comments:         Anesthesia Quick Evaluation

## 2014-01-02 ENCOUNTER — Ambulatory Visit (HOSPITAL_COMMUNITY): Payer: Medicare Other | Admitting: Anesthesiology

## 2014-01-02 ENCOUNTER — Other Ambulatory Visit: Payer: Self-pay | Admitting: *Deleted

## 2014-01-02 ENCOUNTER — Ambulatory Visit (HOSPITAL_COMMUNITY)
Admission: RE | Admit: 2014-01-02 | Discharge: 2014-01-02 | Disposition: A | Discharge status: Home / Self Care | Payer: Medicare Other | Source: Ambulatory Visit | Attending: Obstetrics & Gynecology | Admitting: Obstetrics & Gynecology

## 2014-01-02 ENCOUNTER — Encounter (HOSPITAL_COMMUNITY)
Admission: RE | Disposition: A | Discharge status: Home / Self Care | Payer: Self-pay | Source: Ambulatory Visit | Attending: Obstetrics & Gynecology

## 2014-01-02 ENCOUNTER — Encounter (HOSPITAL_COMMUNITY): Payer: Medicare Other | Admitting: Anesthesiology

## 2014-01-02 DIAGNOSIS — G4730 Sleep apnea, unspecified: Secondary | ICD-10-CM | POA: Insufficient documentation

## 2014-01-02 DIAGNOSIS — N938 Other specified abnormal uterine and vaginal bleeding: Secondary | ICD-10-CM | POA: Insufficient documentation

## 2014-01-02 DIAGNOSIS — I10 Essential (primary) hypertension: Secondary | ICD-10-CM | POA: Diagnosis not present

## 2014-01-02 DIAGNOSIS — N840 Polyp of corpus uteri: Secondary | ICD-10-CM | POA: Insufficient documentation

## 2014-01-02 DIAGNOSIS — R9389 Abnormal findings on diagnostic imaging of other specified body structures: Secondary | ICD-10-CM | POA: Insufficient documentation

## 2014-01-02 DIAGNOSIS — N949 Unspecified condition associated with female genital organs and menstrual cycle: Secondary | ICD-10-CM | POA: Diagnosis not present

## 2014-01-02 DIAGNOSIS — N859 Noninflammatory disorder of uterus, unspecified: Secondary | ICD-10-CM | POA: Diagnosis not present

## 2014-01-02 DIAGNOSIS — N925 Other specified irregular menstruation: Secondary | ICD-10-CM | POA: Diagnosis not present

## 2014-01-02 DIAGNOSIS — K219 Gastro-esophageal reflux disease without esophagitis: Secondary | ICD-10-CM | POA: Insufficient documentation

## 2014-01-02 DIAGNOSIS — N926 Irregular menstruation, unspecified: Secondary | ICD-10-CM | POA: Insufficient documentation

## 2014-01-02 DIAGNOSIS — D259 Leiomyoma of uterus, unspecified: Secondary | ICD-10-CM

## 2014-01-02 HISTORY — PX: HYSTEROSCOPY W/D&C: SHX1775

## 2014-01-02 LAB — PREGNANCY, URINE: PREG TEST UR: NEGATIVE

## 2014-01-02 SURGERY — DILATATION AND CURETTAGE /HYSTEROSCOPY
Anesthesia: General | Site: Vagina

## 2014-01-02 MED ORDER — GLYCOPYRROLATE 0.2 MG/ML IJ SOLN
INTRAMUSCULAR | Status: DC | PRN
  Administered 2014-01-02: 0.2 mg via INTRAVENOUS

## 2014-01-02 MED ORDER — LIDOCAINE HCL (CARDIAC) 20 MG/ML IV SOLN
INTRAVENOUS | Status: AC
  Filled 2014-01-02: qty 5

## 2014-01-02 MED ORDER — PROPOFOL 10 MG/ML IV EMUL
INTRAVENOUS | Status: AC
  Filled 2014-01-02: qty 20

## 2014-01-02 MED ORDER — DEXTROSE IN LACTATED RINGERS 5 % IV SOLN: INTRAVENOUS | Status: DC

## 2014-01-02 MED ORDER — PHENYLEPHRINE HCL 10 MG/ML IJ SOLN
INTRAMUSCULAR | Status: DC | PRN
  Administered 2014-01-02: 80 ug via INTRAVENOUS

## 2014-01-02 MED ORDER — GLYCINE 1.5 % IR SOLN
Status: DC | PRN
  Administered 2014-01-02: 3000 mL

## 2014-01-02 MED ORDER — GLYCOPYRROLATE 0.2 MG/ML IJ SOLN
INTRAMUSCULAR | Status: AC
  Filled 2014-01-02: qty 1

## 2014-01-02 MED ORDER — MIDAZOLAM HCL 2 MG/2ML IJ SOLN
INTRAMUSCULAR | Status: AC
  Filled 2014-01-02: qty 2

## 2014-01-02 MED ORDER — DEXAMETHASONE SODIUM PHOSPHATE 10 MG/ML IJ SOLN
INTRAMUSCULAR | Status: DC | PRN
  Administered 2014-01-02: 10 mg via INTRAVENOUS

## 2014-01-02 MED ORDER — FENTANYL CITRATE 0.05 MG/ML IJ SOLN
INTRAMUSCULAR | Status: DC | PRN
  Administered 2014-01-02 (×4): 25 ug via INTRAVENOUS

## 2014-01-02 MED ORDER — FENTANYL CITRATE 0.05 MG/ML IJ SOLN
INTRAMUSCULAR | Status: AC
  Filled 2014-01-02: qty 2

## 2014-01-02 MED ORDER — MIDAZOLAM HCL 2 MG/2ML IJ SOLN
INTRAMUSCULAR | Status: DC | PRN
  Administered 2014-01-02 (×2): 1 mg via INTRAVENOUS

## 2014-01-02 MED ORDER — KETOROLAC TROMETHAMINE 30 MG/ML IJ SOLN
INTRAMUSCULAR | Status: AC
  Filled 2014-01-02: qty 1

## 2014-01-02 MED ORDER — DEXAMETHASONE SODIUM PHOSPHATE 10 MG/ML IJ SOLN
INTRAMUSCULAR | Status: AC
  Filled 2014-01-02: qty 1

## 2014-01-02 MED ORDER — FENTANYL CITRATE 0.05 MG/ML IJ SOLN: 25.0000 ug | INTRAMUSCULAR | Status: DC | PRN

## 2014-01-02 MED ORDER — ONDANSETRON HCL 4 MG/2ML IJ SOLN
INTRAMUSCULAR | Status: AC
  Filled 2014-01-02: qty 2

## 2014-01-02 MED ORDER — PROPOFOL 10 MG/ML IV BOLUS
INTRAVENOUS | Status: DC | PRN
  Administered 2014-01-02: 200 mg via INTRAVENOUS

## 2014-01-02 MED ORDER — ACETAMINOPHEN 500 MG PO TABS: 500.0000 mg | ORAL_TABLET | ORAL | Status: DC | PRN

## 2014-01-02 MED ORDER — PHENYLEPHRINE 40 MCG/ML (10ML) SYRINGE FOR IV PUSH (FOR BLOOD PRESSURE SUPPORT)
PREFILLED_SYRINGE | INTRAVENOUS | Status: AC
  Filled 2014-01-02: qty 5

## 2014-01-02 MED ORDER — LIDOCAINE HCL (CARDIAC) 20 MG/ML IV SOLN
INTRAVENOUS | Status: DC | PRN
  Administered 2014-01-02: 100 mg via INTRAVENOUS

## 2014-01-02 MED ORDER — METOCLOPRAMIDE HCL 5 MG/ML IJ SOLN: 10.0000 mg | Freq: Once | INTRAMUSCULAR | Status: DC | PRN

## 2014-01-02 MED ORDER — ONDANSETRON HCL 4 MG/2ML IJ SOLN
INTRAMUSCULAR | Status: DC | PRN
  Administered 2014-01-02: 4 mg via INTRAVENOUS

## 2014-01-02 MED ORDER — LACTATED RINGERS IV SOLN
INTRAVENOUS | Status: DC
  Administered 2014-01-02 (×2): via INTRAVENOUS

## 2014-01-02 MED ORDER — MEPERIDINE HCL 25 MG/ML IJ SOLN: 6.2500 mg | INTRAMUSCULAR | Status: DC | PRN

## 2014-01-02 MED ORDER — IBUPROFEN 800 MG PO TABS: 800.0000 mg | ORAL_TABLET | Freq: Three times a day (TID) | ORAL | Status: DC | PRN

## 2014-01-02 MED ORDER — OXYCODONE-ACETAMINOPHEN 5-325 MG PO TABS: 1.0000 | ORAL_TABLET | ORAL | Status: DC | PRN

## 2014-01-02 MED ORDER — KETOROLAC TROMETHAMINE 30 MG/ML IJ SOLN
INTRAMUSCULAR | Status: DC | PRN
  Administered 2014-01-02: 30 mg via INTRAVENOUS

## 2014-01-02 SURGICAL SUPPLY — 19 items
ABLATOR ENDOMETRIAL BIPOLAR (ABLATOR) IMPLANT
CANISTER SUCT 3000ML (MISCELLANEOUS) ×3 IMPLANT
CATH ROBINSON RED A/P 16FR (CATHETERS) ×3 IMPLANT
CATH THERMACHOICE III (CATHETERS) IMPLANT
CLOTH BEACON ORANGE TIMEOUT ST (SAFETY) ×3 IMPLANT
CONTAINER PREFILL 10% NBF 60ML (FORM) ×6 IMPLANT
DILATOR CANAL MILEX (MISCELLANEOUS) ×3 IMPLANT
DRSG TELFA 3X8 NADH (GAUZE/BANDAGES/DRESSINGS) ×3 IMPLANT
ELECT REM PT RETURN 9FT ADLT (ELECTROSURGICAL) ×3
ELECTRODE REM PT RTRN 9FT ADLT (ELECTROSURGICAL) ×1 IMPLANT
GLOVE BIO SURGEON STRL SZ 6 (GLOVE) ×3 IMPLANT
GLOVE BIOGEL PI IND STRL 6 (GLOVE) ×2 IMPLANT
GLOVE BIOGEL PI INDICATOR 6 (GLOVE) ×4
GOWN STRL REUS W/TWL LRG LVL3 (GOWN DISPOSABLE) ×6 IMPLANT
LOOP ANGLED CUTTING 22FR (CUTTING LOOP) IMPLANT
PACK HYSTEROSCOPY LF (CUSTOM PROCEDURE TRAY) ×3 IMPLANT
PAD OB MATERNITY 4.3X12.25 (PERSONAL CARE ITEMS) ×3 IMPLANT
TOWEL OR 17X24 6PK STRL BLUE (TOWEL DISPOSABLE) ×6 IMPLANT
WATER STERILE IRR 1000ML POUR (IV SOLUTION) ×3 IMPLANT

## 2014-01-02 NOTE — Discharge Instructions (Signed)
Call MD for T>100.4, heavy vaginal bleeding, severe abdominal pain, intractable nausea and/or vomiting.  Call office to schedule postoperative appointment in 2 weeks.  No driving while taking narcotics.DISCHARGE INSTRUCTIONS: D&C / D&E The following instructions have been prepared to help you care for yourself upon your return home.   Personal hygiene:  Use sanitary pads for vaginal drainage, not tampons.  Shower the day after your procedure.  NO tub baths, pools or Jacuzzis for 2-3 weeks.  Wipe front to back after using the bathroom.  Activity and limitations:  Do NOT drive or operate any equipment for 24 hours. The effects of anesthesia are still present and drowsiness may result.  Do NOT rest in bed all day.  Walking is encouraged.  Walk up and down stairs slowly.  You may resume your normal activity in one to two days or as indicated by your physician.  Sexual activity: NO intercourse for at least 2 weeks after the procedure, or as indicated by your physician.  Diet: Eat a light meal as desired this evening. You may resume your usual diet tomorrow.  Return to work: You may resume your work activities in one to two days or as indicated by your doctor.  What to expect after your surgery: Expect to have vaginal bleeding/discharge for 2-3 days and spotting for up to 10 days. It is not unusual to have soreness for up to 1-2 weeks. You may have a slight burning sensation when you urinate for the first day. Mild cramps may continue for a couple of days. You may have a regular period in 2-6 weeks.  Call your doctor for any of the following:  Excessive vaginal bleeding, saturating and changing one pad every hour.  Inability to urinate 6 hours after discharge from hospital.  Pain not relieved by pain medication.  Fever of 100.4 F or greater.  Unusual vaginal discharge or odor.   Call for an appointment:    Patients signature: ______________________  Nurses signature  ________________________  Support person's signature_______________________

## 2014-01-02 NOTE — Anesthesia Postprocedure Evaluation (Signed)
  Anesthesia Post-op Note  Anesthesia Post Note  Patient: Amy Jarvis  Procedure(s) Performed: Procedure(s) (LRB): DILATATION AND CURETTAGE /HYSTEROSCOPY (N/A)  Anesthesia type: General  Patient location: PACU  Post pain: Pain level controlled  Post assessment: Post-op Vital signs reviewed  Last Vitals:  Filed Vitals:   01/02/14 0933  BP:   Pulse:   Temp: 36.6 C  Resp:     Post vital signs: Reviewed  Level of consciousness: sedated  Complications: No apparent anesthesia complications

## 2014-01-02 NOTE — Progress Notes (Signed)
No change to H&P. 

## 2014-01-02 NOTE — Op Note (Signed)
PREOPERATIVE DIAGNOSIS:  53 y.o. with irregular uterine bleeding  POSTOPERATIVE DIAGNOSIS: The same with endometrial polyp  PROCEDURE: Hysteroscopy, Dilation and Curettage.  SURGEON:  Dr. Linda Hedges  INDICATIONS: 53 y.o. [phone]  here for scheduled surgery for hysteroscopy, D&C.   Risks of surgery were discussed with the patient including but not limited to: bleeding which may require transfusion; infection which may require antibiotics; injury to uterus or surrounding organs; intrauterine scarring which may impair future fertility; need for additional procedures including laparotomy or laparoscopy; and other postoperative/anesthesia complications. Written informed consent was obtained.    FINDINGS:  A 9 week size uterus.  Atrophic endometrium with polyp filling the endometrial cavity.  Normal ostia bilaterally.  ANESTHESIA:   General  ESTIMATED BLOOD LOSS:  Less than 20 ml  SPECIMENS: Endometrial curettings and polyp sent to pathology  COMPLICATIONS:  None immediate.  PROCEDURE DETAILS:  The patient was taken to the operating room where general anesthesia was administered and was found to be adequate.  After an adequate timeout was performed, she was placed in the dorsal lithotomy position and examined; then prepped and draped in the sterile manner.   Her bladder was catheterized for an unmeasured amount of clear, yellow urine. A speculum was then placed in the patient's vagina and a single tooth tenaculum was applied to the anterior lip of the cervix.   The uteus was sounded to 9 cm and dilated manually with metal dilators to accommodate the 5.5 mm diagnostic hysteroscope.  Once the cervix was dilated, the hysteroscope was inserted under direct visualization using saline as a suspension medium.  The uterine cavity was carefully examined, both ostia were recognized and a polyp was noted to fill the cavity.   After further careful visualization of the uterine cavity, the hysteroscope was  removed under direct visualization.  Using Randall-Stone forceps, polypectomy was performed.  A sharp curettage was then performed to obtain a scant amount of endometrial curettings.  The tenaculum was removed from the anterior lip of the cervix and the vaginal speculum was removed after noting good hemostasis.  The patient tolerated the procedure well and was taken to the recovery area awake, extubated and in stable condition.

## 2014-01-02 NOTE — Transfer of Care (Signed)
Immediate Anesthesia Transfer of Care Note  Patient: Amy Jarvis  Procedure(s) Performed: Procedure(s): DILATATION AND CURETTAGE /HYSTEROSCOPY (N/A)  Patient Location: PACU  Anesthesia Type:General  Level of Consciousness: awake, alert  and oriented  Airway & Oxygen Therapy: Patient Spontanous Breathing and Patient connected to nasal cannula oxygen  Post-op Assessment: Report given to PACU RN, Post -op Vital signs reviewed and stable and Patient moving all extremities  Post vital signs: Reviewed and stable  Complications: No apparent anesthesia complications

## 2014-01-03 ENCOUNTER — Encounter (HOSPITAL_COMMUNITY): Payer: Self-pay | Admitting: Obstetrics & Gynecology

## 2014-01-24 ENCOUNTER — Telehealth: Payer: Self-pay | Admitting: *Deleted

## 2014-01-24 NOTE — Telephone Encounter (Signed)
Received faxed PA request from pt's pharmacy for her Nexium 40mg  caps once daily #30.  Contacted pt's insurance at [phone] to initiate PA.  Per pt, she has tried protonix and OTC PPIs in the past, but was not effective as the nexium.  (Dx:530.81) Approved unitl 10/2014, pharmacy and pt aware.Despina Hidden Cassady2/25/20159:32 AM

## 2014-01-29 DIAGNOSIS — M48061 Spinal stenosis, lumbar region without neurogenic claudication: Secondary | ICD-10-CM | POA: Diagnosis not present

## 2014-03-08 ENCOUNTER — Encounter: Payer: Medicare Other | Admitting: Internal Medicine

## 2014-03-22 ENCOUNTER — Encounter: Payer: Self-pay | Admitting: Internal Medicine

## 2014-03-22 ENCOUNTER — Ambulatory Visit (INDEPENDENT_AMBULATORY_CARE_PROVIDER_SITE_OTHER): Payer: Medicare Other | Admitting: Internal Medicine

## 2014-03-22 VITALS — BP 159/86 | HR 79 | Temp 98.2°F | Ht 67.0 in | Wt 321.0 lb

## 2014-03-22 DIAGNOSIS — E785 Hyperlipidemia, unspecified: Secondary | ICD-10-CM

## 2014-03-22 DIAGNOSIS — L650 Telogen effluvium: Secondary | ICD-10-CM | POA: Insufficient documentation

## 2014-03-22 DIAGNOSIS — L659 Nonscarring hair loss, unspecified: Secondary | ICD-10-CM | POA: Diagnosis not present

## 2014-03-22 DIAGNOSIS — Z0000 Encounter for general adult medical examination without abnormal findings: Secondary | ICD-10-CM | POA: Diagnosis not present

## 2014-03-22 DIAGNOSIS — Z6841 Body Mass Index (BMI) 40.0 and over, adult: Secondary | ICD-10-CM

## 2014-03-22 DIAGNOSIS — I10 Essential (primary) hypertension: Secondary | ICD-10-CM | POA: Diagnosis not present

## 2014-03-22 DIAGNOSIS — R32 Unspecified urinary incontinence: Secondary | ICD-10-CM | POA: Diagnosis not present

## 2014-03-22 DIAGNOSIS — R609 Edema, unspecified: Secondary | ICD-10-CM

## 2014-03-22 DIAGNOSIS — R600 Localized edema: Secondary | ICD-10-CM

## 2014-03-22 LAB — LIPID PANEL
CHOLESTEROL: 240 mg/dL — AB (ref 0–200)
HDL: 52 mg/dL (ref >39)
LDL Cholesterol: 167 mg/dL — ABNORMAL HIGH (ref 0–99)
Total CHOL/HDL Ratio: 4.6 Ratio
Triglycerides: 106 mg/dL (ref <150)
VLDL: 21 mg/dL (ref 0–40)

## 2014-03-22 MED ORDER — HYDROCHLOROTHIAZIDE 25 MG PO TABS: 25.0000 mg | ORAL_TABLET | Freq: Every day | ORAL | Status: DC

## 2014-03-22 NOTE — Progress Notes (Signed)
   Subjective:    Patient ID: Amy Jarvis, female    DOB: [birth year], 53 y.o.   MRN: [id number]  HPI Comments: 53 y.o established clinic patient with multiple problems presents for annual exam.  HTN (BP 141/72), h/o proteinuria, chronic lower extremity edema, muscle cramps, DDD, diverticulosis, fibroids, h/o benign endocervical polyp, GERD, HLD (LDL 181 09/2012), obesity, OSA on cpap, uterine prolapse  She presents for:  1) HTN f/u BP 141/72 2) She wants a referral to dermatology for hair loss present since 2008/2009.  TSH wnl 2013.  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 Pt does not want to show her hair today        Review of Systems  Respiratory: Negative for shortness of breath.   Cardiovascular: Positive for leg swelling. Negative for chest pain.  Gastrointestinal: Negative for constipation.  Genitourinary:       Denies urinary freq problems improved on Detrol   Skin:       +hair loss   Neurological: Negative for dizziness.       Objective:   Physical Exam  Nursing note and vitals reviewed. Constitutional: She is oriented to person, place, and time. Vital signs are normal. She appears well-developed and well-nourished. She is cooperative. No distress.  HENT:  Head: Normocephalic and atraumatic.  Mouth/Throat: Oropharynx is clear and moist and mucous membranes are normal. No oropharyngeal exudate.  Eyes: Conjunctivae are normal. Pupils are equal, round, and reactive to light. Right eye exhibits no discharge. Left eye exhibits no discharge. No scleral icterus.  Cardiovascular: Normal rate, regular rhythm, S1 normal, S2 normal and normal heart sounds.   No murmur heard. 1 to 2+ lower ext edema L>R  Pulmonary/Chest: Effort normal and breath sounds normal. No respiratory distress. She has no wheezes.  Abdominal: Soft. Bowel sounds are normal. There is no tenderness.  Obese ab  Neurological: She is alert and  oriented to person, place, and time. Gait normal.  Skin: Skin is warm, dry and intact. No rash noted. She is not diaphoretic.  Wig on   Psychiatric: She has a normal mood and affect. Her speech is normal and behavior is normal. Judgment and thought content normal. Cognition and memory are normal.          Assessment & Plan:  F/u 3-6 months sooner if needed

## 2014-03-22 NOTE — Assessment & Plan Note (Signed)
Up to date

## 2014-03-22 NOTE — Patient Instructions (Addendum)
General Instructions: Please follow up in 3 months sooner if needed We will refer you to a dermatologist  Pick up your medications from the pharmacy    Treatment Goals:  Goals (1 Years of Data) as of 03/22/14         As of Today 01/02/14 01/02/14 01/02/14 01/02/14     Blood Pressure    . Blood Pressure < 140/90  141/72 135/73 137/73 141/72 131/79     Result Component    . LDL CALC < 130            Progress Toward Treatment Goals:  Treatment Goal 03/22/2014  Blood pressure at goal  Prevent falls at goal    Self Care Goals & Plans:  Self Care Goal 03/22/2014  Manage my medications take my medicines as prescribed; bring my medications to every visit; refill my medications on time  Monitor my health keep track of my blood pressure; bring my blood pressure log to each visit; keep track of my weight; check my feet daily  Eat healthy foods drink diet soda or water instead of juice or soda; eat more vegetables; eat foods that are low in salt; eat baked foods instead of fried foods; eat fruit for snacks and desserts; eat smaller portions  Be physically active find an activity I enjoy  Meeting treatment goals maintain the current self-care plan    No flowsheet data found.   Care Management & Community Referrals:  Referral 03/22/2014  Referrals made for care management support none needed  Referrals made to community resources none       Fall Prevention and Home Safety Falls cause injuries and can affect all age groups. It is possible to prevent falls.  HOW TO PREVENT FALLS  Wear shoes with rubber soles that do not have an opening for your toes.  Keep the inside and outside of your house well lit.  Use night lights throughout your home.  Remove clutter from floors.  Clean up floor spills.  Remove throw rugs or fasten them to the floor with carpet tape.  Do not place electrical cords across pathways.  Put grab bars by your tub, shower, and toilet. Do not use towel bars as  grab bars.  Put handrails on both sides of the stairway. Fix loose handrails.  Do not climb on stools or stepladders, if possible.  Do not wax your floors.  Repair uneven or unsafe sidewalks, walkways, or stairs.  Keep items you use a lot within reach.  Be aware of pets.  Keep emergency numbers next to the telephone.  Put smoke detectors in your home and near bedrooms. Ask your doctor what other things you can do to prevent falls. Document Released: 09/12/2009 Document Revised: 05/17/2012 Document Reviewed: 02/16/2012 [facility] Patient Information 2014 [place], [place].  Exercise to Lose Weight Exercise and a healthy diet may help you lose weight. Your doctor may suggest specific exercises. EXERCISE IDEAS AND TIPS  Choose low-cost things you enjoy doing, such as walking, bicycling, or exercising to workout videos.  Take stairs instead of the elevator.  Walk during your lunch break.  Park your car further away from work or school.  Go to a gym or an exercise class.  Start with 5 to 10 minutes of exercise each day. Build up to 30 minutes of exercise 4 to 6 days a week.  Wear shoes with good support and comfortable clothes.  Stretch before and after working out.  Work out until you breathe harder and  your heart beats faster.  Drink extra water when you exercise.  Do not do so much that you hurt yourself, feel dizzy, or get very short of breath. Exercises that burn about 150 calories:  Running 1  miles in 15 minutes.  Playing volleyball for 45 to 60 minutes.  Washing and waxing a car for 45 to 60 minutes.  Playing touch football for 45 minutes.  Walking 1  miles in 35 minutes.  Pushing a stroller 1  miles in 30 minutes.  Playing basketball for 30 minutes.  Raking leaves for 30 minutes.  Bicycling 5 miles in 30 minutes.  Walking 2 miles in 30 minutes.  Dancing for 30 minutes.  Shoveling snow for 15 minutes.  Swimming laps for 20 minutes.  Walking  up stairs for 15 minutes.  Bicycling 4 miles in 15 minutes.  Gardening for 30 to 45 minutes.  Jumping rope for 15 minutes.  Washing windows or floors for 45 to 60 minutes. Document Released: 12/19/2010 Document Revised: 02/08/2012 Document Reviewed: 12/19/2010 [facility] Patient Information 2014 [place], [place].  DASH Diet The DASH diet stands for "Dietary Approaches to Stop Hypertension." It is a healthy eating plan that has been shown to reduce high blood pressure (hypertension) in as little as 14 days, while also possibly providing other significant health benefits. These other health benefits include reducing the risk of breast cancer after menopause and reducing the risk of type 2 diabetes, heart disease, colon cancer, and stroke. Health benefits also include weight loss and slowing kidney failure in patients with chronic kidney disease.  DIET GUIDELINES  Limit salt (sodium). Your diet should contain less than 1500 mg of sodium daily.  Limit refined or processed carbohydrates. Your diet should include mostly whole grains. Desserts and added sugars should be used sparingly.  Include small amounts of heart-healthy fats. These types of fats include nuts, oils, and tub margarine. Limit saturated and trans fats. These fats have been shown to be harmful in the body. CHOOSING FOODS  The following food groups are based on a 2000 calorie diet. See your Registered Dietitian for individual calorie needs. Grains and Grain Products (6 to 8 servings daily)  Eat More Often: Whole-wheat bread, brown rice, whole-grain or wheat pasta, quinoa, popcorn without added fat or salt (air popped).  Eat Less Often: White bread, white pasta, white rice, cornbread. Vegetables (4 to 5 servings daily)  Eat More Often: Fresh, frozen, and canned vegetables. Vegetables may be raw, steamed, roasted, or grilled with a minimal amount of fat.  Eat Less Often/Avoid: Creamed or fried vegetables. Vegetables in a cheese  sauce. Fruit (4 to 5 servings daily)  Eat More Often: All fresh, canned (in natural juice), or frozen fruits. Dried fruits without added sugar. One hundred percent fruit juice ( cup [237 mL] daily).  Eat Less Often: Dried fruits with added sugar. Canned fruit in light or heavy syrup. [employer], Fish, and Poultry (2 servings or less daily. One serving is 3 to 4 oz [85-114 g]).  Eat More Often: Ninety percent or leaner ground beef, tenderloin, sirloin. Round cuts of beef, chicken breast, [place] breast. All fish. Grill, bake, or broil your meat. Nothing should be fried.  Eat Less Often/Avoid: Fatty cuts of meat, [place], or chicken leg, thigh, or wing. Fried cuts of meat or fish. Dairy (2 to 3 servings)  Eat More Often: Low-fat or fat-free milk, low-fat plain or light yogurt, reduced-fat or part-skim cheese.  Eat Less Often/Avoid: Milk (whole, 2%).Whole milk yogurt.  Full-fat cheeses. Nuts, Seeds, and Legumes (4 to 5 servings per week)  Eat More Often: All without added salt.  Eat Less Often/Avoid: Salted nuts and seeds, canned beans with added salt. Fats and Sweets (limited)  Eat More Often: Vegetable oils, tub margarines without trans fats, sugar-free gelatin. Mayonnaise and salad dressings.  Eat Less Often/Avoid: Coconut oils, palm oils, butter, stick margarine, cream, half and half, cookies, candy, pie. FOR MORE INFORMATION The Dash Diet Eating Plan: www.dashdiet.org Document Released: 11/05/2011 Document Revised: 02/08/2012 Document Reviewed: 11/05/2011 [facility] Patient Information 2014 [place], [place].  Hypertension As your heart beats, it forces blood through your arteries. This force is your blood pressure. If the pressure is too high, it is called hypertension (HTN) or high blood pressure. HTN is dangerous because you may have it and not know it. High blood pressure may mean that your heart has to work harder to pump blood. Your arteries may be narrow or stiff. The extra work  puts you at risk for heart disease, stroke, and other problems.  Blood pressure consists of two numbers, a higher number over a lower, 110/72, for example. It is stated as "110 over 72." The ideal is below 120 for the top number (systolic) and under 80 for the bottom (diastolic). Write down your blood pressure today. You should pay close attention to your blood pressure if you have certain conditions such as:  Heart failure.  Prior heart attack.  Diabetes  Chronic kidney disease.  Prior stroke.  Multiple risk factors for heart disease. To see if you have HTN, your blood pressure should be measured while you are seated with your arm held at the level of the heart. It should be measured at least twice. A one-time elevated blood pressure reading (especially in the Emergency Department) does not mean that you need treatment. There may be conditions in which the blood pressure is different between your right and left arms. It is important to see your caregiver soon for a recheck. Most people have essential hypertension which means that there is not a specific cause. This type of high blood pressure may be lowered by changing lifestyle factors such as:  Stress.  Smoking.  Lack of exercise.  Excessive weight.  Drug/tobacco/alcohol use.  Eating less salt. Most people do not have symptoms from high blood pressure until it has caused damage to the body. Effective treatment can often prevent, delay or reduce that damage. TREATMENT  When a cause has been identified, treatment for high blood pressure is directed at the cause. There are a large number of medications to treat HTN. These fall into several categories, and your caregiver will help you select the medicines that are best for you. Medications may have side effects. You should review side effects with your caregiver. If your blood pressure stays high after you have made lifestyle changes or started on medicines,   Your medication(s) may  need to be changed.  Other problems may need to be addressed.  Be certain you understand your prescriptions, and know how and when to take your medicine.  Be sure to follow up with your caregiver within the time frame advised (usually within two weeks) to have your blood pressure rechecked and to review your medications.  If you are taking more than one medicine to lower your blood pressure, make sure you know how and at what times they should be taken. Taking two medicines at the same time can result in blood pressure that is too low. SEEK  IMMEDIATE MEDICAL CARE IF:  You develop a severe headache, blurred or changing vision, or confusion.  You have unusual weakness or numbness, or a faint feeling.  You have severe chest or abdominal pain, vomiting, or breathing problems. MAKE SURE YOU:   Understand these instructions.  Will watch your condition.  Will get help right away if you are not doing well or get worse. Document Released: 11/16/2005 Document Revised: 02/08/2012 Document Reviewed: 07/06/2008 [facility] Patient Information 2014 [place]. Alopecia Areata Alopecia areata is a self-destructing (autoimmune) disease that results in the loss of hair. In this condition your body's immune system attacks the hair follicle. The hair follicle is responsible for growing hair. Hair loss can occur on the scalp and other parts of the body. It usually starts as one or more small, round, smooth patches of hair loss. It occurs in males and females of all ages and races, but usually starts before age 75. The scalp is the most commonly affected area, but the beard or any hair-bearing site can be affected. This type of hair loss does not leave scars where the hair was lost.  Many people with alopecia areata only have a few patches of hair loss. In others, extensive patchy hair loss occurs. In a few people, all scalp hair is lost (alopecia totalis), or hair is lost from the entire scalp and body  (alopecia universalis). No matter how widespread the hair loss, the hair follicles remain alive and are ready to resume normal hair production whenever they receive the correct signal. Hair re-growth may occur without treatment and can even restart after years of hair loss.  CAUSES  It is thought that something triggers the immune system to stop hair growth. It is not always known what the cause is. Some people have genetic markers that can increase the chance of developing alopecia areata. Alopecia areata often occurs in families whose members have had:  Asthma.  Hay fever.  Atopic eczema.  Some autoimmune diseases may also be a trigger, such as:  Thyroid disease.  Diabetes.  Rheumatoid arthritis.  Lupus erythematosus.  Vitiligo.  Pernicious anemia.  Addison's disease. OTHER SYMPTOMS In some people, the nail beds may develop rows of tiny dents (stippling) or the nail beds can become distorted. Other than the hair and nail beds, no other body part is affected.  PROGNOSIS  Alopecia areata is not medically disabling. People with alopecia areata are usually in excellent health. Hair loss can be emotionally difficult. The [place] has resources available to help individuals and families with alopecia areata. Their goal is to help people with the condition live full, productive lives. There are many successful, well-adjusted, contented people living with Alopecia areata. Alopecia areata can be overcome with:  A positive self image.  Sound medical facts.  The support of others, such as:  Sometimes professional counseling is helpful to develop one's self-confidence and positive self-image. TREATMENT  There is no cure for alopecia areata. There are several available treatments. Treatments are most effective in milder cases. No treatment is effective for everyone. Choice of treatment depends mainly on a person's age and the extent of their hair loss. Alopecia  areata occurs in two forms:   A mild patchy form where less than 50 percent of scalp hair is lost.  An extensive form where greater than 50 percent of scalp hair is lost. These two forms of alopecia areata behave quite differently, and the choice of treatment depends on which form is present.  Current treatments do not turn alopecia areata off. They can stimulate the hair follicle to produce hair.  Some medications used to treat mild cases include:  Cortisone injections. The most common treatment is the injection of cortisone into the bare skin patches. The injections are usually given by a caregiver specializing in skin issues (dermatologist). This caregiver will use a tiny needle to give multiple injections into the skin in and around the bare patches. The injections are repeated once a month. If new hair growth occurs, it is usually visible within 4 weeks. Treatment does not prevent new patches of hair loss from developing. There are few side effects from local cortisone injections. Occasionally, temporary dents (depressions) in the skin result from the local injections, but these dents can fill in by themselves.  Topical minoxidil. Five percent topical minoxidil solution applied twice daily may grow hair in alopecia areata. Scalp, eyebrows, and beard hair may respond. If scalp hair re-grows completely, treatment can be stopped. Response may improve if topical cortisone cream is applied 30 minutes after the minoxidil. Topical minoxidil is safe, easy to use, and does not lower blood pressure in persons with normal blood pressure. Minoxidil can lead to unwanted facial hair growth in some people.  Anthralin cream or ointment. Another treatment is the application of anthralin cream or ointment. Anthralin is a tar-like substance that has been used widely for psoriasis. Anthralin is applied to the bare patches once daily. It is washed off after a short time, usually 30 to 60 minutes later. If new hair growth  occurs, it is seen in 8 to 12 weeks. Anthralin can be irritating to the skin. It can cause temporary, brownish discoloration of the treated skin. By using short treatment times, skin irritation and skin staining are reduced without decreasing effectiveness. Care must be taken not to get anthralin in the eyes. Some of the medications used for more extensive cases where there is greater than 50% hair loss include:  Cortisone pills. Cortisone pills are sometimes given for extensive scalp hair loss. Cortisone taken internally is much stronger than local injections of cortisone into the skin. It is necessary to discuss possible side effects of cortisone pills with your caregiver. In general, however, cortisone pills are used in relatively few patients with alopecia areata due to health risks from prolonged use. Also, hair that has grown is likely to fall out when the cortisone pills are stopped.  Topical minoxidil. See previous explanation under mild, patchy alopecia areata. However, minoxidil is not effective in total loss of scalp hair (alopecia totalis).  Topical immunotherapy. Another method of treating alopecia areata or alopecia totalis/universalis involves producing an allergic rash or allergic contact dermatitis. Chemicals such as diphencyprone (DPCP) or squaric acid dibutyl ester (SADBE) are applied to the scalp to produce an allergic rash which resembles poison oak or ivy. Approximately 40% of patients treated with topical immunotherapy will re-grow scalp hair after about 6 months of treatment. Those who do successfully re-grow scalp hair will need to continue treatment to maintain hair re-growth.  Wigs. For extensive hair loss, a wig can be an important option for some people. Proper attention will make a quality wig look completely natural. A wig will need to be cut, thinned, and styled. To keep a net base wig from falling off, special double-sided tape can be purchased in beauty supply outlets and  fastened to the inside of the wig.  For those with completely bare heads, there are suction caps to which any wig  can be attached. There are also entire suction cap wig units. FOR MORE INFORMATION National Alopecia Areata Foundation: https://www.berry.org/ Document Released: 06/20/2004 Document Revised: 02/08/2012 Document Reviewed: 01/22/2009 [facility] Patient Information 2014 [place], [place].  Minoxidil topical solution or foam What is this medicine? MINOXIDIL (mi NOX i dill) is used to increase new hair growth in cases of hereditary hair loss. When applied to the scalp, this medicine can promote hair growth in men with female pattern baldness. This medicine can also help women with thin hair and frontal hair loss. Women should not use extra-strength minoxidil products. This medicine may be used for other purposes; ask your health care provider or pharmacist if you have questions. COMMON BRAND NAME(S): Rogaine What should I tell my health care provider before I take this medicine? They need to know if you have any of these conditions: -frontal hair loss or receding hairline -no family history of hair loss -sudden or patchy hair loss -unknown reason for hair loss -your scalp is red, inflamed, infected or painful -an unusual or allergic reaction to minoxidil, other medicines, foods, dyes, or preservatives -pregnant or trying to get pregnant -breast-feeding How should I use this medicine? This medicine is for external use on the scalp only. Do not take by mouth. Follow the directions that come with the product. The hair and scalp should be dry before you use this medicine. You do not need to shampoo your hair before each application. Do not use more often than directed. Talk to your pediatrician regarding the use of this medicine in children. This medicine is not approved for use in children. Overdosage: If you think you have taken too much of this medicine contact a poison control center or emergency  room at once. NOTE: This medicine is only for you. Do not share this medicine with others. What if I miss a dose? If you miss a dose, use it as soon as you can. If it is almost time for your next dose, use only that dose. Do not use double or extra doses. What may interact with this medicine? Interactions are not expected. Do not use any other medicines on the scalp without asking your doctor or health care professional. This list may not describe all possible interactions. Give your health care provider a list of all the medicines, herbs, non-prescription drugs, or dietary supplements you use. Also tell them if you smoke, drink alcohol, or use illegal drugs. Some items may interact with your medicine. What should I watch for while using this medicine? This medicine must be used on a regular basis for your hair to regrow. It may take 2 to 4 months of regular use before you notice any improvement. It is important to continue to use this product to maintain regrowth of hair. Once you stop using it, the regrown hair will usually fall out within 3 months. If you do not see any new hair growth after 4 months, stop using this product and contact your doctor or health care professional. Do not get this medicine in your eyes, nose, mouth, or on other sensitive areas. If you do, rinse off with plenty of cool tap water. Always wash your hands after use. Do not apply if your scalp is cut, scraped, or sunburned. Some people may notice changes in hair color or texture after using this medicine. What side effects may I notice from receiving this medicine? Side effects that you should report to your doctor or health care professional as soon as possible: -chest  pain or palpitations -dizziness or fainting -skin rash, blisters, or itching -sudden weight gain -swelling of the hands or feet Side effects that usually do not require medical attention (report to your doctor or health care professional if they continue  or are bothersome): -headache -redness, irritation and itching at the site of application -unusual hair growth, on the face, arm, and back This list may not describe all possible side effects. Call your doctor for medical advice about side effects. You may report side effects to FDA at 1-800-FDA-1088. Where should I keep my medicine? Keep out of the reach of children. Store at room temperature between 20 and 25 degrees C (68 and 77 degrees F). Some products may be flammable. Keep away from heat, fire or flame. Throw away any unused medicine after the expiration date. NOTE: This sheet is a summary. It may not cover all possible information. If you have questions about this medicine, talk to your doctor, pharmacist, or health care provider.  2014, [employer]. (2008-06-04 14:26:21)

## 2014-03-22 NOTE — Assessment & Plan Note (Signed)
Could be related to genetic, prev chemical processing  Will refer to dermatology disc'ed Rogaine Given info on hair loss

## 2014-03-22 NOTE — Assessment & Plan Note (Signed)
Will check lipid panel today 

## 2014-03-22 NOTE — Assessment & Plan Note (Addendum)
BP Readings from Last 3 Encounters:  03/22/14 141/72  01/02/14 135/73  01/02/14 135/73    Lab Results  Component Value Date   NA 139 12/27/2013   K 3.7 12/27/2013   CREATININE 0.70 12/27/2013    Assessment: Blood pressure control: mildly elevated Progress toward BP goal:  at goal Comments: none 141/72 but overall improved   Plan: Medications:  continue current medications Educational resources provided: brochure;other (see comments) Self management tools provided: home blood pressure logbook;other (see comments) Other plans: f/u in 3-6 months, lipid panel today, Rx refill HCTZ 25 mg qd

## 2014-03-22 NOTE — Assessment & Plan Note (Signed)
disc'ed exercise to lose weight

## 2014-03-22 NOTE — Assessment & Plan Note (Signed)
Likely dependent due to body habitus  disc'ed elevation, compression, on diuretics

## 2014-03-22 NOTE — Assessment & Plan Note (Signed)
Improved on Detrol

## 2014-03-23 ENCOUNTER — Encounter: Payer: Self-pay | Admitting: Internal Medicine

## 2014-03-25 NOTE — Progress Notes (Signed)
Case discussed with Dr. McLean soon after the resident saw the patient.  We reviewed the resident's history and exam and pertinent patient test results.  I agree with the assessment, diagnosis, and plan of care documented in the resident's note. 

## 2014-04-12 ENCOUNTER — Other Ambulatory Visit: Payer: Self-pay | Admitting: Internal Medicine

## 2014-04-12 MED ORDER — BLOOD PRESSURE KIT: 1.0000 | PACK | Freq: Every day | Status: DC

## 2014-04-17 DIAGNOSIS — L650 Telogen effluvium: Secondary | ICD-10-CM | POA: Diagnosis not present

## 2014-05-03 ENCOUNTER — Encounter: Payer: Self-pay | Admitting: Internal Medicine

## 2014-05-23 ENCOUNTER — Other Ambulatory Visit: Payer: Self-pay | Admitting: *Deleted

## 2014-05-23 MED ORDER — POTASSIUM CHLORIDE CRYS ER 20 MEQ PO TBCR: 20.0000 meq | EXTENDED_RELEASE_TABLET | Freq: Every day | ORAL | Status: DC

## 2014-06-08 ENCOUNTER — Other Ambulatory Visit: Payer: Self-pay | Admitting: *Deleted

## 2014-06-08 MED ORDER — ESOMEPRAZOLE MAGNESIUM 40 MG PO CPDR: 40.0000 mg | DELAYED_RELEASE_CAPSULE | Freq: Every day | ORAL | Status: DC

## 2014-06-08 MED ORDER — LOSARTAN POTASSIUM 100 MG PO TABS: 100.0000 mg | ORAL_TABLET | Freq: Every day | ORAL | Status: DC

## 2014-06-08 MED ORDER — AMLODIPINE BESYLATE 5 MG PO TABS: 5.0000 mg | ORAL_TABLET | Freq: Every day | ORAL | Status: DC

## 2014-06-25 ENCOUNTER — Other Ambulatory Visit: Payer: Self-pay | Admitting: Internal Medicine

## 2014-07-25 ENCOUNTER — Encounter: Payer: Self-pay | Admitting: Internal Medicine

## 2014-08-20 ENCOUNTER — Other Ambulatory Visit: Payer: Self-pay

## 2014-08-20 DIAGNOSIS — Z1231 Encounter for screening mammogram for malignant neoplasm of breast: Secondary | ICD-10-CM

## 2014-08-23 ENCOUNTER — Encounter: Payer: Self-pay | Admitting: Internal Medicine

## 2014-08-23 ENCOUNTER — Ambulatory Visit (INDEPENDENT_AMBULATORY_CARE_PROVIDER_SITE_OTHER): Payer: Medicare Other | Admitting: Internal Medicine

## 2014-08-23 VITALS — BP 143/59 | HR 89 | Temp 98.5°F | Ht 67.0 in | Wt 330.6 lb

## 2014-08-23 DIAGNOSIS — R609 Edema, unspecified: Secondary | ICD-10-CM | POA: Diagnosis not present

## 2014-08-23 DIAGNOSIS — Z0000 Encounter for general adult medical examination without abnormal findings: Secondary | ICD-10-CM | POA: Diagnosis not present

## 2014-08-23 DIAGNOSIS — I10 Essential (primary) hypertension: Secondary | ICD-10-CM | POA: Diagnosis not present

## 2014-08-23 DIAGNOSIS — Z23 Encounter for immunization: Secondary | ICD-10-CM | POA: Diagnosis not present

## 2014-08-23 DIAGNOSIS — R32 Unspecified urinary incontinence: Secondary | ICD-10-CM | POA: Diagnosis not present

## 2014-08-23 DIAGNOSIS — E785 Hyperlipidemia, unspecified: Secondary | ICD-10-CM | POA: Diagnosis not present

## 2014-08-23 DIAGNOSIS — Z6841 Body Mass Index (BMI) 40.0 and over, adult: Secondary | ICD-10-CM | POA: Diagnosis not present

## 2014-08-23 DIAGNOSIS — L659 Nonscarring hair loss, unspecified: Secondary | ICD-10-CM | POA: Diagnosis not present

## 2014-08-23 DIAGNOSIS — B351 Tinea unguium: Secondary | ICD-10-CM

## 2014-08-23 LAB — COMPLETE METABOLIC PANEL WITH GFR
ALBUMIN: 4.4 g/dL (ref 3.5–5.2)
ALT: 19 U/L (ref 0–35)
AST: 16 U/L (ref 0–37)
Alkaline Phosphatase: 117 U/L (ref 39–117)
BUN: 14 mg/dL (ref 6–23)
CALCIUM: 9.9 mg/dL (ref 8.4–10.5)
CHLORIDE: 99 meq/L (ref 96–112)
CO2: 29 meq/L (ref 19–32)
Creat: 0.79 mg/dL (ref 0.50–1.10)
GFR, Est Non African American: 86 mL/min
Glucose, Bld: 87 mg/dL (ref 70–99)
POTASSIUM: 3.7 meq/L (ref 3.5–5.3)
Sodium: 138 mEq/L (ref 135–145)
Total Bilirubin: 0.7 mg/dL (ref 0.2–1.2)
Total Protein: 7.4 g/dL (ref 6.0–8.3)

## 2014-08-23 MED ORDER — TERBINAFINE HCL 250 MG PO TABS: 250.0000 mg | ORAL_TABLET | Freq: Every day | ORAL | Status: AC

## 2014-08-23 MED ORDER — POTASSIUM CHLORIDE CRYS ER 20 MEQ PO TBCR: 20.0000 meq | EXTENDED_RELEASE_TABLET | Freq: Every day | ORAL | Status: DC

## 2014-08-23 MED ORDER — BLOOD PRESSURE KIT: 1.0000 | PACK | Freq: Every day | Status: DC

## 2014-08-23 MED ORDER — HYDROCHLOROTHIAZIDE 25 MG PO TABS: 25.0000 mg | ORAL_TABLET | Freq: Every day | ORAL | Status: DC

## 2014-08-23 MED ORDER — HYDROCORTISONE 2.5 % EX OINT: TOPICAL_OINTMENT | Freq: Two times a day (BID) | CUTANEOUS | Status: DC

## 2014-08-23 NOTE — Progress Notes (Signed)
Medicine attending: Medical history, presenting problems, physical findings, and medications, reviewed with resident physician Dr. Karlyn Agee and I concur with her evaluation and management plan. Murriel Hopper, M.D., [place]

## 2014-08-23 NOTE — Assessment & Plan Note (Signed)
Will treat with Lamisil 250 mg qd x 12 weeks , check baseline CMET and repeat CMET in 12 weeks Will try OTC Lotrimin or Lamisil to feet and/or Hydrocortisone 2.5 mg oint If not improving refer to podiatry

## 2014-08-23 NOTE — Assessment & Plan Note (Signed)
BP Readings from Last 3 Encounters:  08/23/14 143/59  03/22/14 159/86  01/02/14 135/73    Lab Results  Component Value Date   NA 139 12/27/2013   K 3.7 12/27/2013   CREATININE 0.70 12/27/2013    Assessment: Blood pressure control: controlled Progress toward BP goal:  at goal Comments: improved   Plan: Medications:  continue current medications (Losartan 100 mg qd, HCTZ 25 mg qd, norvasc 5-10 mg with BP goal <140/90)  Educational resources provided: brochure Self management tools provided: home blood pressure logbook Other plans: will check CMET today

## 2014-08-23 NOTE — Patient Instructions (Addendum)
General Instructions: Please follow up in 3 months Take care   Treatment Goals:  Goals (1 Years of Data) as of 08/23/14         As of Today 03/22/14 03/22/14 01/02/14 01/02/14     Blood Pressure    . Blood Pressure < 140/90  143/59 159/86 141/72 135/73 137/73     Result Component    . LDL CALC < 130   167         Progress Toward Treatment Goals:  Treatment Goal 08/23/2014  Blood pressure at goal  Prevent falls -    Self Care Goals & Plans:  Self Care Goal 08/23/2014  Manage my medications take my medicines as prescribed; bring my medications to every visit; refill my medications on time; follow the sick day instructions if I am sick  Monitor my health keep track of my blood pressure  Eat healthy foods drink diet soda or water instead of juice or soda; eat more vegetables; eat foods that are low in salt; eat baked foods instead of fried foods; eat fruit for snacks and desserts; eat smaller portions  Be physically active find an activity I enjoy  Meeting treatment goals maintain the current self-care plan    No flowsheet data found.   Care Management & Community Referrals:  Referral 08/23/2014  Referrals made for care management support none needed  Referrals made to community resources none       Onychomycosis/Fungal Toenails  WHAT IS IT? An infection that lies within the keratin of your nail plate that is caused by a fungus.  WHY ME? Fungal infections affect all ages, sexes, races, and creeds.  There may be many factors that predispose you to a fungal infection such as age, coexisting medical conditions such as diabetes, or an autoimmune disease; stress, medications, fatigue, genetics, etc.  Bottom line: fungus thrives in a warm, moist environment and your shoes offer such a location.  IS IT CONTAGIOUS? Theoretically, yes.  You do not want to share shoes, nail clippers or files with someone who has fungal toenails.  Walking around barefoot in the same room or sleeping in  the same bed is unlikely to transfer the organism.  It is important to realize, however, that fungus can spread easily from one nail to the next on the same foot.  HOW DO WE TREAT THIS?  There are several ways to treat this condition.  Treatment may depend on many factors such as age, medications, pregnancy, liver and kidney conditions, etc.  It is best to ask your doctor which options are available to you.  1. No treatment.   Unlike many other medical concerns, you can live with this condition.  However for many people this can be a painful condition and may lead to ingrown toenails or a bacterial infection.  It is recommended that you keep the nails cut short to help reduce the amount of fungal nail. 2. Topical treatment.  These range from herbal remedies to prescription strength nail lacquers.  About 40-50% effective, topicals require twice daily application for approximately 9 to 12 months or until an entirely new nail has grown out.  The most effective topicals are medical grade medications available through physicians offices. 3. Oral antifungal medications.  With an 80-90% cure rate, the most common oral medication requires 3 to 4 months of therapy and stays in your system for a year as the new nail grows out.  Oral antifungal medications do require blood work to make sure  it is a safe drug for you.  A liver function panel will be performed prior to starting the medication and after the first month of treatment.  It is important to have the blood work performed to avoid any harmful side effects.  In general, this medication safe but blood work is required. 4. Laser Therapy.  This treatment is performed by applying a specialized laser to the affected nail plate.  This therapy is noninvasive, fast, and non-painful.  It is not covered by insurance and is therefore, out of pocket.  The results have been very good with a 80-95% cure rate.  [facility] is the only practice in the area to offer this  therapy. 5. Permanent Nail Avulsion.  Removing the entire nail so that a new nail will not grow back. Terbinafine tablets What is this medicine? TERBINAFINE (TER bin a feen) is an antifungal medicine. It is used to treat certain kinds of fungal or yeast infections. This medicine may be used for other purposes; ask your health care provider or pharmacist if you have questions. COMMON BRAND NAME(S): Lamisil, Terbinex What should I tell my health care provider before I take this medicine? They need to know if you have any of these conditions: -drink alcoholic beverages -kidney disease -liver disease -an unusual or allergic reaction to terbinafine, other medicines, foods, dyes, or preservatives -pregnant or trying to get pregnant -breast-feeding How should I use this medicine? Take this medicine by mouth with a full glass of water. Follow the directions on the prescription label. You can take this medicine with food or on an empty stomach. Take your medicine at regular intervals. Do not take your medicine more often than directed. Do not skip doses or stop your medicine early even if you feel better. Do not stop taking except on your doctor's advice. Talk to your pediatrician regarding the use of this medicine in children. Special care may be needed. Overdosage: If you think you have taken too much of this medicine contact a poison control center or emergency room at once. NOTE: This medicine is only for you. Do not share this medicine with others. What if I miss a dose? If you miss a dose, take it as soon as you can. If it is almost time for your next dose, take only that dose. Do not take double or extra doses. What may interact with this medicine? Do not take this medicine with any of the following medications: -thioridazine This medicine may also interact with the following medications: -beta-blockers -caffeine -cimetidine -cyclosporine -medicines for depression, anxiety, or psychotic  disturbances -medicines for fungal infections like fluconazole and ketoconazole -medicines for irregular heartbeat like amiodarone, flecainide and propafenone -rifampin -warfarin This list may not describe all possible interactions. Give your health care provider a list of all the medicines, herbs, non-prescription drugs, or dietary supplements you use. Also tell them if you smoke, drink alcohol, or use illegal drugs. Some items may interact with your medicine. What should I watch for while using this medicine? Visit your doctor or health care provider regularly. Tell your doctor right away if you have nausea or vomiting, loss of appetite, stomach pain on your right upper side, yellow skin, dark urine, light stools, or are over tired. Some fungal infections need many weeks or months of treatment to cure. If you are taking this medicine for a long time, you will need to have important blood work done. What side effects may I notice from receiving this medicine?  Side effects that you should report to your doctor or health care professional as soon as possible: -allergic reactions like skin rash or hives, swelling of the face, lips, or tongue -changes in vision -dark urine -fever or infection -general ill feeling or flu-like symptoms -light-colored stools -loss of appetite, nausea -redness, blistering, peeling or loosening of the skin, including inside the mouth -right upper belly pain -unusually weak or tired -yellowing of the eyes or skin Side effects that usually do not require medical attention (report to your doctor or health care professional if they continue or are bothersome): -changes in taste -diarrhea -hair loss -muscle or joint pain -stomach gas -stomach upset This list may not describe all possible side effects. Call your doctor for medical advice about side effects. You may report side effects to FDA at 1-800-FDA-1088. Where should I keep my medicine? Keep out of the reach of  children. Store at room temperature below 25 degrees C (77 degrees F). Protect from light. Throw away any unused medicine after the expiration date. NOTE: This sheet is a summary. It may not cover all possible information. If you have questions about this medicine, talk to your doctor, pharmacist, or health care provider.  2015, [employer]vier/Gold Standard. (2008-01-27 16:28:07) Athlete's Foot Athlete's foot (tinea pedis) is a fungal infection of the skin on the feet. It often occurs on the skin between the toes or underneath the toes. It can also occur on the soles of the feet. Athlete's foot is more likely to occur in hot, humid weather. Not washing your feet or changing your socks often enough can contribute to athlete's foot. The infection can spread from person to person (contagious). CAUSES Athlete's foot is caused by a fungus. This fungus thrives in warm, moist places. Most people get athlete's foot by sharing shower stalls, towels, and wet floors with an infected person. People with weakened immune systems, including those with diabetes, may be more likely to get athlete's foot. SYMPTOMS   Itchy areas between the toes or on the soles of the feet.  White, flaky, or scaly areas between the toes or on the soles of the feet.  Tiny, intensely itchy blisters between the toes or on the soles of the feet.  Tiny cuts on the skin. These cuts can develop a bacterial infection.  Thick or discolored toenails. DIAGNOSIS  Your caregiver can usually tell what the problem is by doing a physical exam. Your caregiver may also take a skin sample from the rash area. The skin sample may be examined under a microscope, or it may be tested to see if fungus will grow in the sample. A sample may also be taken from your toenail for testing. TREATMENT  Over-the-counter and prescription medicines can be used to kill the fungus. These medicines are available as powders or creams. Your caregiver can suggest medicines for  you. Fungal infections respond slowly to treatment. You may need to continue using your medicine for several weeks. PREVENTION   Do not share towels.  Wear sandals in wet areas, such as shared locker rooms and shared showers.  Keep your feet dry. Wear shoes that allow air to circulate. Wear cotton or wool socks. HOME CARE INSTRUCTIONS   Take medicines as directed by your caregiver. Do not use steroid creams on athlete's foot.  Keep your feet clean and cool. Wash your feet daily and dry them thoroughly, especially between your toes.  Change your socks every day. Wear cotton or wool socks. In hot climates, you  may need to change your socks 2 to 3 times per day.  Wear sandals or canvas tennis shoes with good air circulation.  If you have blisters, soak your feet in Burow's solution or Epsom salts for 20 to 30 minutes, 2 times a day to dry out the blisters. Make sure you dry your feet thoroughly afterward. SEEK MEDICAL CARE IF:   You have a fever.  You have swelling, soreness, warmth, or redness in your foot.  You are not getting better after 7 days of treatment.  You are not completely cured after 30 days.  You have any problems caused by your medicines. MAKE SURE YOU:   Understand these instructions.  Will watch your condition.  Will get help right away if you are not doing well or get worse. Document Released: 11/13/2000 Document Revised: 02/08/2012 Document Reviewed: 09/04/2011 [facility] Patient Information 2015 [place], [place]. This information is not intended to replace advice given to you by your health care provider. Make sure you discuss any questions you have with your health care provider.

## 2014-08-23 NOTE — Progress Notes (Signed)
   Subjective:    Patient ID: Amy Jarvis, female    DOB: 03-Oct-1961, 53 y.o.   MRN: [id number]  HPI Comments: 53 y.o PMH DDD, GERD, HTN, chronic lower ext edema, obesity, vertigo  She presents for f/u  1) HTN-better controlled today 143/59 on Norvasc 5-10 mg qd, HCTZ 25 mg qd, Losartan 100 mg qd. She needs Rx refills of HCTZ. Pt noticed BP more controlled since she was taking an old Rx for Norvasc 10 mg qd  2) She c/o toenails being thick and black and soles of feet itching.   3) Chronic lower ext edema stable     Review of Systems  Respiratory: Negative for shortness of breath.   Cardiovascular: Positive for leg swelling. Negative for chest pain.  Gastrointestinal: Negative for constipation.  Genitourinary: Negative for dysuria.       Objective:   Physical Exam  Nursing note and vitals reviewed. Constitutional: She is oriented to person, place, and time. Vital signs are normal. She appears well-developed and well-nourished. She is cooperative. No distress.  HENT:  Head: Normocephalic and atraumatic.  Mouth/Throat: Oropharynx is clear and moist and mucous membranes are normal. No oropharyngeal exudate.  Eyes: Conjunctivae are normal. Pupils are equal, round, and reactive to light. Right eye exhibits no discharge. Left eye exhibits no discharge. No scleral icterus.  Cardiovascular: Normal rate, regular rhythm, S1 normal, S2 normal and normal heart sounds.   No murmur heard. 1+ lower ext edema b/l   Pulmonary/Chest: Effort normal and breath sounds normal. No respiratory distress. She has no wheezes.  Abdominal: Soft. Bowel sounds are normal. There is no tenderness.  Obese ab  Neurological: She is alert and oriented to person, place, and time. Gait normal.  Skin: Skin is warm and dry. No rash noted. She is not diaphoretic.  Dystrophic discolored black toenails, worse nail left great toe, eczematous changes and peeling to soles of feet that is pruritic   Psychiatric: She has  a normal mood and affect. Her speech is normal and behavior is normal. Judgment and thought content normal. Cognition and memory are normal.          Assessment & Plan:  F/u in 3 months check CMET

## 2014-08-23 NOTE — Assessment & Plan Note (Signed)
Flu vaccine today 

## 2014-09-06 ENCOUNTER — Ambulatory Visit
Admission: RE | Admit: 2014-09-06 | Discharge: 2014-09-06 | Disposition: A | Discharge status: Home / Self Care | Payer: Medicare Other | Source: Ambulatory Visit

## 2014-09-06 DIAGNOSIS — Z1231 Encounter for screening mammogram for malignant neoplasm of breast: Secondary | ICD-10-CM

## 2014-10-01 ENCOUNTER — Encounter: Payer: Self-pay | Admitting: Internal Medicine

## 2014-10-31 ENCOUNTER — Inpatient Hospital Stay (HOSPITAL_COMMUNITY)
Admission: AD | Admit: 2014-10-31 | Discharge: 2014-11-01 | Disposition: A | Discharge status: Home / Self Care | Payer: Medicare Other | Source: Ambulatory Visit | Attending: Obstetrics and Gynecology | Admitting: Obstetrics and Gynecology

## 2014-10-31 ENCOUNTER — Encounter (HOSPITAL_COMMUNITY): Payer: Self-pay

## 2014-10-31 DIAGNOSIS — M545 Low back pain: Secondary | ICD-10-CM | POA: Diagnosis not present

## 2014-10-31 DIAGNOSIS — I10 Essential (primary) hypertension: Secondary | ICD-10-CM | POA: Insufficient documentation

## 2014-10-31 DIAGNOSIS — Z9049 Acquired absence of other specified parts of digestive tract: Secondary | ICD-10-CM | POA: Diagnosis not present

## 2014-10-31 DIAGNOSIS — R109 Unspecified abdominal pain: Secondary | ICD-10-CM

## 2014-10-31 DIAGNOSIS — G4733 Obstructive sleep apnea (adult) (pediatric): Secondary | ICD-10-CM | POA: Insufficient documentation

## 2014-10-31 DIAGNOSIS — R1110 Vomiting, unspecified: Secondary | ICD-10-CM | POA: Diagnosis not present

## 2014-10-31 DIAGNOSIS — R112 Nausea with vomiting, unspecified: Secondary | ICD-10-CM | POA: Insufficient documentation

## 2014-10-31 DIAGNOSIS — R1031 Right lower quadrant pain: Secondary | ICD-10-CM | POA: Diagnosis not present

## 2014-10-31 DIAGNOSIS — R100 Acute abdomen: Secondary | ICD-10-CM | POA: Diagnosis not present

## 2014-10-31 DIAGNOSIS — D259 Leiomyoma of uterus, unspecified: Secondary | ICD-10-CM | POA: Diagnosis not present

## 2014-10-31 LAB — CBC WITH DIFFERENTIAL/PLATELET
Basophils Absolute: 0 10*3/uL (ref 0.0–0.1)
Basophils Relative: 0 % (ref 0–1)
Eosinophils Absolute: 0.1 10*3/uL (ref 0.0–0.7)
Eosinophils Relative: 1 % (ref 0–5)
HCT: 36.7 % (ref 36.0–46.0)
Hemoglobin: 12.2 g/dL (ref 12.0–15.0)
Lymphocytes Relative: 31 % (ref 12–46)
Lymphs Abs: 2.5 10*3/uL (ref 0.7–4.0)
MCH: 27 pg (ref 26.0–34.0)
MCHC: 33.2 g/dL (ref 30.0–36.0)
MCV: 81.2 fL (ref 78.0–100.0)
MONOS PCT: 8 % (ref 3–12)
Monocytes Absolute: 0.6 10*3/uL (ref 0.1–1.0)
NEUTROS ABS: 4.8 10*3/uL (ref 1.7–7.7)
NEUTROS PCT: 60 % (ref 43–77)
Platelets: 356 10*3/uL (ref 150–400)
RBC: 4.52 MIL/uL (ref 3.87–5.11)
RDW: 16 % — ABNORMAL HIGH (ref 11.5–15.5)
WBC: 8 10*3/uL (ref 4.0–10.5)

## 2014-10-31 LAB — URINALYSIS, ROUTINE W REFLEX MICROSCOPIC
Bilirubin Urine: NEGATIVE
GLUCOSE, UA: NEGATIVE mg/dL
KETONES UR: NEGATIVE mg/dL
LEUKOCYTES UA: NEGATIVE
Nitrite: NEGATIVE
Protein, ur: NEGATIVE mg/dL
Specific Gravity, Urine: 1.01 (ref 1.005–1.030)
Urobilinogen, UA: 0.2 mg/dL (ref 0.0–1.0)
pH: 6.5 (ref 5.0–8.0)

## 2014-10-31 LAB — URINE MICROSCOPIC-ADD ON

## 2014-10-31 LAB — POCT PREGNANCY, URINE: Preg Test, Ur: NEGATIVE

## 2014-10-31 MED ORDER — OXYCODONE-ACETAMINOPHEN 5-325 MG PO TABS
1.0000 | ORAL_TABLET | Freq: Once | ORAL | Status: AC
  Administered 2014-10-31: 1 via ORAL
  Filled 2014-10-31: qty 1

## 2014-10-31 MED ORDER — ONDANSETRON 8 MG PO TBDP
8.0000 mg | ORAL_TABLET | Freq: Once | ORAL | Status: AC
  Administered 2014-10-31: 8 mg via ORAL
  Filled 2014-10-31: qty 1

## 2014-10-31 NOTE — MAU Note (Signed)
Right flank pain that started last night. Denies vaginal bleeding or discharge. Denies urinary complaints. Vomiting since this morning. Denies fever.

## 2014-10-31 NOTE — MAU Provider Note (Signed)
History     CSN: [id number]  Arrival date and time: 10/31/14 2001   First Provider Initiated Contact with Patient 10/31/14 2136      Chief Complaint  Patient presents with  . Abdominal Pain  . Emesis   HPI Comments: Amy Jarvis 53 y.o. [id number] who presents to MAU with right lower quad and flank pain that started last night. Pain is 8/10 and 10/10 with movement or sitting up.  She describes it as constant, sharp and achy. She has also had some nausea and vomiting. She had a uterine polyp removed by Dr Lynnette Caffey in feb 2105. She denies any vaginal bleeding or discharge. She is not sexually active x 5 years. She has no urinary complaints or fever. She took tylenol and Motrin without relief. She has had her gallbladder removed.   Abdominal Pain Associated symptoms include nausea and vomiting.  Emesis  Associated symptoms include abdominal pain.      Past Medical History  Diagnosis Date  . Hyperlipidemia LDL goal < 130 09/25/2008  . Morbid obesity 08/28/2010  . Obstructive sleep apnea on CPAP 06/20/2010  . Hypertension 09/25/2008  . GERD 09/05/2009  . Diverticulosis 03/26/2010  . Microscopic hematuria 01/13/2010  . Cystocele 01/13/2010  . Uterine prolapse 11/19/2008  . Perimenopausal 01/13/2010  . Vertigo 10/07/2012  . Degenerative disk disease 10/07/2012  . Cervical polyp     possible history of cervical polyp    Past Surgical History  Procedure Laterality Date  . Cholecystectomy  2010  . Rotator cuff repair      left  . Tubal ligation  1992  . Hysteroscopy w/d&c N/A 01/02/2014    Procedure: DILATATION AND CURETTAGE /HYSTEROSCOPY;  Surgeon: Dorothye Berni Hedges, DO;  Location: [facility];  Service: Gynecology;  Laterality: N/A;    Family History  Problem Relation Age of Onset  . Colon cancer Mother 39  . Hypertension Mother   . Stroke Mother 21  . Breast cancer Other     maternal great-aunt  . Cancer Other     maternal great aunt with breast cancer    History  Substance Use  Topics  . Smoking status: Never Smoker   . Smokeless tobacco: Never Used  . Alcohol Use: No    Allergies:  Allergies  Allergen Reactions  . Ace Inhibitors Itching and Swelling    REACTION: itching and swelling - left arm, tingling tongue  . Promethazine Hcl Itching and Swelling    Lip swelling & itching    Prescriptions prior to admission  Medication Sig Dispense Refill Last Dose  . acetaminophen (TYLENOL) 500 MG tablet Take 1 tablet (500 mg total) by mouth every 4 (four) hours as needed. 180 tablet 0 Taking  . amLODipine (NORVASC) 5 MG tablet Take 1 tablet (5 mg total) by mouth daily. 30 tablet 5 Taking  . Ascorbic Acid (VITAMIN C PO) Take by mouth.   Taking  . Blood Pressure KIT 1 kit by Does not apply route daily. 1 each 0 Taking  . Cholecalciferol (VITAMIN D PO) Take by mouth.   Taking  . esomeprazole (NEXIUM) 40 MG capsule Take 1 capsule (40 mg total) by mouth daily before breakfast. 30 capsule 6 Taking  . hydrochlorothiazide (HYDRODIURIL) 25 MG tablet Take 1 tablet (25 mg total) by mouth daily. 90 tablet 2 Taking  . hydrocortisone 2.5 % ointment Apply topically 2 (two) times daily. 454 g 0 Taking  . ibuprofen (ADVIL,MOTRIN) 800 MG tablet Take 1 tablet (800 mg total) by mouth  every 8 (eight) hours as needed. 30 tablet 0 Taking  . losartan (COZAAR) 100 MG tablet Take 1 tablet (100 mg total) by mouth daily. 30 tablet 5 Taking  . meclizine (ANTIVERT) 25 MG tablet Take 1 tablet (25 mg total) by mouth 3 (three) times daily as needed. 30 tablet 2 Taking  . Multiple Vitamin (MULTIVITAMIN WITH MINERALS) TABS Take 1 tablet by mouth every other day.   Taking  . Omega-3 Fatty Acids (FISH OIL) 1000 MG CAPS Take 1 capsule by mouth daily.   Taking  . potassium chloride SA (K-DUR,KLOR-CON) 20 MEQ tablet Take 1 tablet (20 mEq total) by mouth daily. 30 tablet 1 Taking  . terbinafine (LAMISIL) 250 MG tablet Take 1 tablet (250 mg total) by mouth daily. 84 tablet 0     Review of Systems   Constitutional: Negative.   HENT: Negative.   Eyes: Negative.   Respiratory: Negative.   Cardiovascular: Negative.   Gastrointestinal: Positive for nausea, vomiting and abdominal pain.  Genitourinary: Negative.   Musculoskeletal: Positive for back pain.       Has chronic back pain  Skin: Negative.   Neurological: Negative.   Psychiatric/Behavioral: Negative.    Physical Exam   Blood pressure 157/85, pulse 75, temperature 98.2 F (36.8 C), temperature source Oral, resp. rate 18, height 5' 7"  (1.702 m), weight 150.503 kg (331 lb 12.8 oz), last menstrual period 03/31/2013, SpO2 99 %.  Physical Exam  Constitutional: She is oriented to person, place, and time. She appears well-developed and well-nourished. No distress.  Morbid Obesity  HENT:  Head: Normocephalic and atraumatic.  Eyes: Conjunctivae are normal. Pupils are equal, round, and reactive to light.  Cardiovascular: Normal rate, regular rhythm and normal heart sounds.   Respiratory: Effort normal and breath sounds normal. No respiratory distress. She has no wheezes. She has no rales.  GI: Soft. Bowel sounds are normal. She exhibits no distension. There is tenderness.  Right lower quad and side. Difficult to exam due to obesity  Genitourinary:  Genital: External negative Vaginal: prolapse uterus/ small amount thin white discharge Cervix:atrophic Bimanual: difficult due to obesity   Musculoskeletal: Normal range of motion.  Neurological: She is alert and oriented to person, place, and time.  Skin: Skin is warm and dry.  Psychiatric: She has a normal mood and affect. Her behavior is normal. Judgment and thought content normal.   Results for orders placed or performed during the hospital encounter of 10/31/14 (from the past 24 hour(s))  Urinalysis, Routine w reflex microscopic     Status: Abnormal   Collection Time: 10/31/14  8:21 PM  Result Value Ref Range   Color, Urine YELLOW YELLOW   APPearance CLEAR CLEAR   Specific  Gravity, Urine 1.010 1.005 - 1.030   pH 6.5 5.0 - 8.0   Glucose, UA NEGATIVE NEGATIVE mg/dL   Hgb urine dipstick TRACE (A) NEGATIVE   Bilirubin Urine NEGATIVE NEGATIVE   Ketones, ur NEGATIVE NEGATIVE mg/dL   Protein, ur NEGATIVE NEGATIVE mg/dL   Urobilinogen, UA 0.2 0.0 - 1.0 mg/dL   Nitrite NEGATIVE NEGATIVE   Leukocytes, UA NEGATIVE NEGATIVE  Urine microscopic-add on     Status: Abnormal   Collection Time: 10/31/14  8:21 PM  Result Value Ref Range   Squamous Epithelial / LPF MANY (A) RARE   WBC, UA 0-2 <3 WBC/hpf   RBC / HPF 0-2 <3 RBC/hpf   Bacteria, UA RARE RARE  Pregnancy, urine POC     Status: None   Collection  Time: 10/31/14  8:43 PM  Result Value Ref Range   Preg Test, Ur NEGATIVE NEGATIVE  CBC with Differential     Status: Abnormal   Collection Time: 10/31/14 10:10 PM  Result Value Ref Range   WBC 8.0 4.0 - 10.5 K/uL   RBC 4.52 3.87 - 5.11 MIL/uL   Hemoglobin 12.2 12.0 - 15.0 g/dL   HCT 36.7 36.0 - 46.0 %   MCV 81.2 78.0 - 100.0 fL   MCH 27.0 26.0 - 34.0 pg   MCHC 33.2 30.0 - 36.0 g/dL   RDW 16.0 (H) 11.5 - 15.5 %   Platelets 356 150 - 400 K/uL   Neutrophils Relative % 60 43 - 77 %   Neutro Abs 4.8 1.7 - 7.7 K/uL   Lymphocytes Relative 31 12 - 46 %   Lymphs Abs 2.5 0.7 - 4.0 K/uL   Monocytes Relative 8 3 - 12 %   Monocytes Absolute 0.6 0.1 - 1.0 K/uL   Eosinophils Relative 1 0 - 5 %   Eosinophils Absolute 0.1 0.0 - 0.7 K/uL   Basophils Relative 0 0 - 1 %   Basophils Absolute 0.0 0.0 - 0.1 K/uL  Comprehensive metabolic panel     Status: None   Collection Time: 10/31/14 10:10 PM  Result Value Ref Range   Sodium 138 137 - 147 mEq/L   Potassium 3.7 3.7 - 5.3 mEq/L   Chloride 97 96 - 112 mEq/L   CO2 27 19 - 32 mEq/L   Glucose, Bld 91 70 - 99 mg/dL   BUN 11 6 - 23 mg/dL   Creatinine, Ser 0.69 0.50 - 1.10 mg/dL   Calcium 10.1 8.4 - 10.5 mg/dL   Total Protein 7.7 6.0 - 8.3 g/dL   Albumin 3.9 3.5 - 5.2 g/dL   AST 24 0 - 37 U/L   ALT 17 0 - 35 U/L   Alkaline  Phosphatase 103 39 - 117 U/L   Total Bilirubin 0.4 0.3 - 1.2 mg/dL   GFR calc non Af Amer >90 >90 mL/min   GFR calc Af Amer >90 >90 mL/min   Anion gap 14 5 - 15   US Abdomen Complete  11/01/2014   CLINICAL DATA:  53 year old female with acute right flank and abdominal pain with vomiting. Initial encounter. History of cholecystectomy.  EXAM: ULTRASOUND ABDOMEN COMPLETE  COMPARISON:  10/25/2013 CT and 09/11/2009 ultrasound  FINDINGS: Gallbladder: The gallbladder is not visualized compatible with cholecystectomy.  Common bile duct: Diameter: 3.2 mm. There is no evidence of biliary dilatation.  Liver: Mild diffuse increased echogenicity may represent mild fatty infiltration. No other hepatic abnormalities noted.  IVC: No abnormality visualized.  Pancreas: Visualized portion unremarkable.  Spleen: Size and appearance within normal limits.  Right Kidney: Length: 13.0. Echogenicity within normal limits. No mass or hydronephrosis visualized.  Left Kidney: Length: 11.5. Echogenicity within normal limits. No mass or hydronephrosis visualized.  Abdominal aorta: No aneurysm visualized.  Other findings: None.  IMPRESSION: No evidence of acute abnormality.  Normal kidneys.  No evidence of hydronephrosis.  Question mild fatty infiltration of the liver.   Electronically Signed   By: Hassan Rowan M.D.   On: 11/01/2014 01:31     MAU Course  Procedures  MDM  CBC, CMET, Abd ultrasound Percocet/  zofran Pt had to leave due to transportation issues Discussed patient with Dr Helane Rima  Assessment and Plan   A: Right sided pain  P: Above labs and ultrasound reviewed with patient She needed to  leave so were unable to do further testing Reglan 10 mg po q8 hours for nausea Advised to call PCP in morning and if pain worsened go to ED    Georgia Duff 10/31/2014, 9:58 PM

## 2014-10-31 NOTE — MAU Note (Signed)
Pt states that she has not had anything by mouth since 1500. At 1500 she ate crackers.

## 2014-11-01 ENCOUNTER — Emergency Department (HOSPITAL_COMMUNITY)
Admission: EM | Admit: 2014-11-01 | Discharge: 2014-11-01 | Disposition: A | Discharge status: Home / Self Care | Payer: Medicare Other | Attending: Emergency Medicine | Admitting: Emergency Medicine

## 2014-11-01 ENCOUNTER — Encounter (HOSPITAL_COMMUNITY): Payer: Self-pay | Admitting: Emergency Medicine

## 2014-11-01 ENCOUNTER — Emergency Department (HOSPITAL_COMMUNITY): Payer: Medicare Other

## 2014-11-01 ENCOUNTER — Inpatient Hospital Stay (HOSPITAL_COMMUNITY): Payer: Medicare Other

## 2014-11-01 DIAGNOSIS — G4733 Obstructive sleep apnea (adult) (pediatric): Secondary | ICD-10-CM | POA: Diagnosis not present

## 2014-11-01 DIAGNOSIS — K219 Gastro-esophageal reflux disease without esophagitis: Secondary | ICD-10-CM | POA: Diagnosis not present

## 2014-11-01 DIAGNOSIS — Z7952 Long term (current) use of systemic steroids: Secondary | ICD-10-CM | POA: Diagnosis not present

## 2014-11-01 DIAGNOSIS — Z8742 Personal history of other diseases of the female genital tract: Secondary | ICD-10-CM | POA: Insufficient documentation

## 2014-11-01 DIAGNOSIS — M545 Low back pain, unspecified: Secondary | ICD-10-CM

## 2014-11-01 DIAGNOSIS — E785 Hyperlipidemia, unspecified: Secondary | ICD-10-CM | POA: Insufficient documentation

## 2014-11-01 DIAGNOSIS — R1031 Right lower quadrant pain: Secondary | ICD-10-CM | POA: Diagnosis present

## 2014-11-01 DIAGNOSIS — R1110 Vomiting, unspecified: Secondary | ICD-10-CM | POA: Diagnosis not present

## 2014-11-01 DIAGNOSIS — Z79899 Other long term (current) drug therapy: Secondary | ICD-10-CM | POA: Insufficient documentation

## 2014-11-01 DIAGNOSIS — I10 Essential (primary) hypertension: Secondary | ICD-10-CM | POA: Diagnosis not present

## 2014-11-01 DIAGNOSIS — D259 Leiomyoma of uterus, unspecified: Secondary | ICD-10-CM | POA: Diagnosis not present

## 2014-11-01 DIAGNOSIS — R100 Acute abdomen: Secondary | ICD-10-CM | POA: Diagnosis not present

## 2014-11-01 DIAGNOSIS — Z9981 Dependence on supplemental oxygen: Secondary | ICD-10-CM | POA: Insufficient documentation

## 2014-11-01 DIAGNOSIS — Z9049 Acquired absence of other specified parts of digestive tract: Secondary | ICD-10-CM | POA: Diagnosis not present

## 2014-11-01 LAB — COMPREHENSIVE METABOLIC PANEL
ALT: 17 U/L (ref 0–35)
AST: 24 U/L (ref 0–37)
Albumin: 3.9 g/dL (ref 3.5–5.2)
Alkaline Phosphatase: 103 U/L (ref 39–117)
Anion gap: 14 (ref 5–15)
BUN: 11 mg/dL (ref 6–23)
CO2: 27 meq/L (ref 19–32)
Calcium: 10.1 mg/dL (ref 8.4–10.5)
Chloride: 97 mEq/L (ref 96–112)
Creatinine, Ser: 0.69 mg/dL (ref 0.50–1.10)
GFR calc Af Amer: >90 mL/min (ref >90)
GLUCOSE: 91 mg/dL (ref 70–99)
Potassium: 3.7 mEq/L (ref 3.7–5.3)
Sodium: 138 mEq/L (ref 137–147)
Total Bilirubin: 0.4 mg/dL (ref 0.3–1.2)
Total Protein: 7.7 g/dL (ref 6.0–8.3)

## 2014-11-01 MED ORDER — METOCLOPRAMIDE HCL 10 MG PO TABS: 10.0000 mg | ORAL_TABLET | Freq: Four times a day (QID) | ORAL | Status: DC

## 2014-11-01 MED ORDER — ONDANSETRON 4 MG PO TBDP
4.0000 mg | ORAL_TABLET | Freq: Once | ORAL | Status: AC
  Administered 2014-11-01: 4 mg via ORAL
  Filled 2014-11-01: qty 1

## 2014-11-01 MED ORDER — OXYCODONE-ACETAMINOPHEN 5-325 MG PO TABS: 1.0000 | ORAL_TABLET | Freq: Four times a day (QID) | ORAL | Status: DC | PRN

## 2014-11-01 MED ORDER — OXYCODONE-ACETAMINOPHEN 5-325 MG PO TABS
1.0000 | ORAL_TABLET | Freq: Once | ORAL | Status: AC
  Administered 2014-11-01: 1 via ORAL
  Filled 2014-11-01: qty 1

## 2014-11-01 NOTE — Discharge Instructions (Signed)
Abdominal Pain °Many things can cause abdominal pain. Usually, abdominal pain is not caused by a disease and will improve without treatment. It can often be observed and treated at home. Your health care provider will do a physical exam and possibly order blood tests and X-rays to help determine the seriousness of your pain. However, in many cases, more time must pass before a clear cause of the pain can be found. Before that point, your health care provider may not know if you need more testing or further treatment. °HOME CARE INSTRUCTIONS  °Monitor your abdominal pain for any changes. The following actions may help to alleviate any discomfort you are experiencing: °· Only take over-the-counter or prescription medicines as directed by your health care provider. °· Do not take laxatives unless directed to do so by your health care provider. °· Try a clear liquid diet (broth, tea, or water) as directed by your health care provider. Slowly move to a bland diet as tolerated. °SEEK MEDICAL CARE IF: °· You have unexplained abdominal pain. °· You have abdominal pain associated with nausea or diarrhea. °· You have pain when you urinate or have a bowel movement. °· You experience abdominal pain that wakes you in the night. °· You have abdominal pain that is worsened or improved by eating food. °· You have abdominal pain that is worsened with eating fatty foods. °· You have a fever. °SEEK IMMEDIATE MEDICAL CARE IF:  °· Your pain does not go away within 2 hours. °· You keep throwing up (vomiting). °· Your pain is felt only in portions of the abdomen, such as the right side or the left lower portion of the abdomen. °· You pass bloody or black tarry stools. °MAKE SURE YOU: °· Understand these instructions.   °· Will watch your condition.   °· Will get help right away if you are not doing well or get worse.   °Document Released: 08/26/2005 Document Revised: 11/21/2013 Document Reviewed: 07/26/2013 °[employer]® Patient Information  ©2015 [employer]. This information is not intended to replace advice given to you by your health care provider. Make sure you discuss any questions you have with your health care provider. ° °Abdominal Pain, Women °Abdominal (stomach, pelvic, or belly) pain can be caused by many things. It is important to tell your doctor: °· The location of the pain. °· Does it come and go or is it present all the time? °· Are there things that start the pain (eating certain foods, exercise)? °· Are there other symptoms associated with the pain (fever, nausea, vomiting, diarrhea)? °All of this is helpful to know when trying to find the cause of the pain. °CAUSES  °· Stomach: virus or bacteria infection, or ulcer. °· Intestine: appendicitis (inflamed appendix), regional ileitis (Crohn's disease), ulcerative colitis (inflamed colon), irritable bowel syndrome, diverticulitis (inflamed diverticulum of the colon), or cancer of the stomach or intestine. °· Gallbladder disease or stones in the gallbladder. °· Kidney disease, kidney stones, or infection. °· Pancreas infection or cancer. °· Fibromyalgia (pain disorder). °· Diseases of the female organs: °¨ Uterus: fibroid (non-cancerous) tumors or infection. °¨ Fallopian tubes: infection or tubal pregnancy. °¨ Ovary: cysts or tumors. °¨ Pelvic adhesions (scar tissue). °¨ Endometriosis (uterus lining tissue growing in the pelvis and on the pelvic organs). °¨ Pelvic congestion syndrome (female organs filling up with blood just before the menstrual period). °¨ Pain with the menstrual period. °¨ Pain with ovulation (producing an egg). °¨ Pain with an IUD (intrauterine device, birth control) in the uterus. °¨   Cancer of the female organs. °· Functional pain (pain not caused by a disease, may improve without treatment). °· Psychological pain. °· Depression. °DIAGNOSIS  °Your doctor will decide the seriousness of your pain by doing an examination. °· Blood tests. °· X-rays. °· Ultrasound. °· CT  scan (computed tomography, special type of X-ray). °· MRI (magnetic resonance imaging). °· Cultures, for infection. °· Barium enema (dye inserted in the large intestine, to better view it with X-rays). °· Colonoscopy (looking in intestine with a lighted tube). °· Laparoscopy (minor surgery, looking in abdomen with a lighted tube). °· Major abdominal exploratory surgery (looking in abdomen with a large incision). °TREATMENT  °The treatment will depend on the cause of the pain.  °· Many cases can be observed and treated at home. °· Over-the-counter medicines recommended by your caregiver. °· Prescription medicine. °· Antibiotics, for infection. °· Birth control pills, for painful periods or for ovulation pain. °· Hormone treatment, for endometriosis. °· Nerve blocking injections. °· Physical therapy. °· Antidepressants. °· Counseling with a psychologist or psychiatrist. °· Minor or major surgery. °HOME CARE INSTRUCTIONS  °· Do not take laxatives, unless directed by your caregiver. °· Take over-the-counter pain medicine only if ordered by your caregiver. Do not take aspirin because it can cause an upset stomach or bleeding. °· Try a clear liquid diet (broth or water) as ordered by your caregiver. Slowly move to a bland diet, as tolerated, if the pain is related to the stomach or intestine. °· Have a thermometer and take your temperature several times a day, and record it. °· Bed rest and sleep, if it helps the pain. °· Avoid sexual intercourse, if it causes pain. °· Avoid stressful situations. °· Keep your follow-up appointments and tests, as your caregiver orders. °· If the pain does not go away with medicine or surgery, you may try: °¨ Acupuncture. °¨ Relaxation exercises (yoga, meditation). °¨ Group therapy. °¨ Counseling. °SEEK MEDICAL CARE IF:  °· You notice certain foods cause stomach pain. °· Your home care treatment is not helping your pain. °· You need stronger pain medicine. °· You want your IUD  removed. °· You feel faint or lightheaded. °· You develop nausea and vomiting. °· You develop a rash. °· You are having side effects or an allergy to your medicine. °SEEK IMMEDIATE MEDICAL CARE IF:  °· Your pain does not go away or gets worse. °· You have a fever. °· Your pain is felt only in portions of the abdomen. The right side could possibly be appendicitis. The left lower portion of the abdomen could be colitis or diverticulitis. °· You are passing blood in your stools (bright red or black tarry stools, with or without vomiting). °· You have blood in your urine. °· You develop chills, with or without a fever. °· You pass out. °MAKE SURE YOU:  °· Understand these instructions. °· Will watch your condition. °· Will get help right away if you are not doing well or get worse. °Document Released: 09/13/2007 Document Revised: 04/02/2014 Document Reviewed: 10/03/2009 °[employer]® Patient Information ©2015 [employer]. This information is not intended to replace advice given to you by your health care provider. Make sure you discuss any questions you have with your health care provider. ° °

## 2014-11-01 NOTE — Discharge Instructions (Signed)

## 2014-11-01 NOTE — ED Notes (Signed)
[facility], did multiple test with negative results. Pt c/o right lower abd pain that radiates to back. Pt c/o n/v yesterday.

## 2014-11-01 NOTE — MAU Note (Signed)
Pt verbalized understanding of all the discharge instructions Monna Fam NP gave. Pt unable to receive discharge papers as computers went down on unit. Prescription for anti-emetic called in to pharmacy. Pt understands that if symptoms become worse, she is to go to the ER.

## 2014-11-01 NOTE — ED Provider Notes (Signed)
CSN: [id number]     Arrival date & time 11/01/14  1314 History   First MD Initiated Contact with Patient 11/01/14 1345     Chief Complaint  Patient presents with  . Abdominal Pain     The history is provided by the patient.   Amy Jarvis presents for evaluation of right lower quadrant and right flank pain. She reports 2 days of pain that is in her right groin radiating to her right low back. Pain is described as sharp and constant. It did initiate as intermittent pain. She had some nausea and vomiting yesterday that has since resolved. She denies any fevers, dysuria. She denies any recent injuries. She was at [facility] hospital overnight and this morning and left after having an ultrasound done because of ride issues. Symptoms are moderate, constant, and worsening.  Past Medical History  Diagnosis Date  . Hyperlipidemia LDL goal < 130 09/25/2008  . Morbid obesity 08/28/2010  . Obstructive sleep apnea on CPAP 06/20/2010  . Hypertension 09/25/2008  . GERD 09/05/2009  . Diverticulosis 03/26/2010  . Microscopic hematuria 01/13/2010  . Cystocele 01/13/2010  . Uterine prolapse 11/19/2008  . Perimenopausal 01/13/2010  . Vertigo 10/07/2012  . Degenerative disk disease 10/07/2012  . Cervical polyp     possible history of cervical polyp   Past Surgical History  Procedure Laterality Date  . Cholecystectomy  2010  . Rotator cuff repair      left  . Tubal ligation  1992  . Hysteroscopy w/d&c N/A 01/02/2014    Procedure: DILATATION AND CURETTAGE /HYSTEROSCOPY;  Surgeon: Linda Hedges, DO;  Location: [facility];  Service: Gynecology;  Laterality: N/A;   Family History  Problem Relation Age of Onset  . Colon cancer Mother 45  . Hypertension Mother   . Stroke Mother 53  . Breast cancer Other     maternal great-aunt  . Cancer Other     maternal great aunt with breast cancer   History  Substance Use Topics  . Smoking status: Never Smoker   . Smokeless tobacco: Never Used  . Alcohol Use: No   OB  History    Gravida Para Term Preterm AB TAB SAB Ectopic Multiple Living   _0 0 0 0 0 0 0 3     Review of Systems  All other systems reviewed and are negative.     Allergies  Ace inhibitors and Promethazine hcl  Home Medications   Prior to Admission medications   Medication Sig Start Date End Date Taking? Authorizing Provider  acetaminophen (TYLENOL) 500 MG tablet Take 500 mg by mouth every 6 (six) hours as needed for mild pain or headache.   Yes Historical Provider, MD  amLODipine (NORVASC) 5 MG tablet Take 1 tablet (5 mg total) by mouth daily. Patient taking differently: Take 5 mg by mouth at bedtime.  06/08/14 06/08/15 Yes Cresenciano Genre, MD  BIOTIN PO Take 1 tablet by mouth daily.   Yes Historical Provider, MD  esomeprazole (NEXIUM) 40 MG capsule Take 1 capsule (40 mg total) by mouth daily before breakfast. Patient taking differently: Take 40 mg by mouth at bedtime.  06/08/14  Yes Cresenciano Genre, MD  hydrochlorothiazide (HYDRODIURIL) 25 MG tablet Take 1 tablet (25 mg total) by mouth daily. Patient taking differently: Take 25 mg by mouth at bedtime.  08/23/14  Yes Cresenciano Genre, MD  hydrocortisone 2.5 % ointment Apply topically 2 (two) times daily. Patient taking differently: Apply 1 application topically daily as needed (  for rash).  08/23/14  Yes Cresenciano Genre, MD  losartan (COZAAR) 100 MG tablet Take 1 tablet (100 mg total) by mouth daily. Patient taking differently: Take 100 mg by mouth at bedtime.  06/08/14  Yes Cresenciano Genre, MD  Magnesium 250 MG TABS Take 1 tablet by mouth daily.   Yes Historical Provider, MD  Omega-3 Fatty Acids (FISH OIL) 1000 MG CAPS Take 1 capsule by mouth daily.   Yes Historical Provider, MD  terbinafine (LAMISIL) 250 MG tablet Take 1 tablet (250 mg total) by mouth daily. 08/23/14 11/15/14 Yes Cresenciano Genre, MD  Tetrahydroz-Dextran-PEG-Povid [facility] ADVANCED RELIEF) 0.05-0.1-1-1 % SOLN Apply 2 drops to eye daily as needed (for dry eyes).   Yes Historical  Provider, MD  acetaminophen (TYLENOL) 500 MG tablet Take 1 tablet (500 mg total) by mouth every 4 (four) hours as needed. Patient not taking: Reported on 11/01/2014 01/02/14   Cresenciano Genre, MD  Blood Pressure KIT 1 kit by Does not apply route daily. Patient not taking: Reported on 11/01/2014 08/23/14   Cresenciano Genre, MD  ibuprofen (ADVIL,MOTRIN) 800 MG tablet Take 1 tablet (800 mg total) by mouth every 8 (eight) hours as needed. Patient not taking: Reported on 11/01/2014 01/02/14   Linda Hedges, DO  meclizine (ANTIVERT) 25 MG tablet Take 1 tablet (25 mg total) by mouth 3 (three) times daily as needed. Patient not taking: Reported on 11/01/2014 06/01/13   Cresenciano Genre, MD  Multiple Vitamin (MULTIVITAMIN WITH MINERALS) TABS Take 1 tablet by mouth every other day.    Historical Provider, MD  potassium chloride SA (K-DUR,KLOR-CON) 20 MEQ tablet Take 1 tablet (20 mEq total) by mouth daily. Patient not taking: Reported on 11/01/2014 08/23/14   Cresenciano Genre, MD   BP 168/78 mmHg  Pulse 83  Temp(Src) 98.6 F (37 C) (Oral)  Resp 20  Ht 5' 7" (1.702 m)  Wt 330 lb (149.687 kg)  BMI 51.67 kg/m2  SpO2 100%  LMP 03/31/2013 Physical Exam  Constitutional: She is oriented to person, place, and time. She appears well-developed and well-nourished.  HENT:  Head: Normocephalic and atraumatic.  Cardiovascular: Normal rate and regular rhythm.   No murmur heard. Pulmonary/Chest: Effort normal and breath sounds normal. No respiratory distress.  Abdominal: Soft. There is no tenderness. There is no rebound and no guarding.  No appreciable abdominal tenderness. There is mild tenderness over the right lower back.  Musculoskeletal: She exhibits no edema or tenderness.  Neurological: She is alert and oriented to person, place, and time.  Skin: Skin is warm and dry.  Psychiatric: She has a normal mood and affect. Her behavior is normal.  Nursing note and vitals reviewed.   ED Course  Procedures (including critical  care time) Labs Review Labs Reviewed - No data to display  Imaging Review US Abdomen Complete  11/01/2014   CLINICAL DATA:  53 year old female with acute right flank and abdominal pain with vomiting. Initial encounter. History of cholecystectomy.  EXAM: ULTRASOUND ABDOMEN COMPLETE  COMPARISON:  10/25/2013 CT and 09/11/2009 ultrasound  FINDINGS: Gallbladder: The gallbladder is not visualized compatible with cholecystectomy.  Common bile duct: Diameter: 3.2 mm. There is no evidence of biliary dilatation.  Liver: Mild diffuse increased echogenicity may represent mild fatty infiltration. No other hepatic abnormalities noted.  IVC: No abnormality visualized.  Pancreas: Visualized portion unremarkable.  Spleen: Size and appearance within normal limits.  Right Kidney: Length: 13.0. Echogenicity within normal limits. No mass or hydronephrosis visualized.  Left  Kidney: Length: 11.5. Echogenicity within normal limits. No mass or hydronephrosis visualized.  Abdominal aorta: No aneurysm visualized.  Other findings: None.  IMPRESSION: No evidence of acute abnormality.  Normal kidneys.  No evidence of hydronephrosis.  Question mild fatty infiltration of the liver.   Electronically Signed   By: Hassan Rowan M.D.   On: 11/01/2014 01:31   Ct Renal Stone Study  11/01/2014   CLINICAL DATA:  Acute right abdominal pain for 2 days. Prior cholecystectomy, tubal ligation  EXAM: CT ABDOMEN AND PELVIS WITHOUT CONTRAST  TECHNIQUE: Multidetector CT imaging of the abdomen and pelvis was performed following the standard protocol without IV contrast.  COMPARISON:  10/25/2013  FINDINGS: Lower chest: Clear lung bases. Normal heart size. No pericardial or pleural effusion. Small hiatal hernia noted. Degenerative changes of the lower thoracic spine.  Abdomen and pelvis: No acute obstructing ureteral calculus, hydronephrosis, or obstructive uropathy on either side.  Prior cholecystectomy. Liver, biliary system, pancreas, spleen, and adrenal  glands are within normal limits for age and noncontrast imaging.  Negative for bowel obstruction, dilatation, ileus, or free air.  No abdominal or pelvic free fluid, fluid collection, hemorrhage, abscess, or adenopathy.  Normal appendix demonstrated.  Minor atherosclerosis without aneurysm.  Stable small midline fat containing umbilical hernia.  Diverticulosis noted of the sigmoid colon. Uterine fibroids noted which are partially calcified. Urinary bladder unremarkable. No inguinal abnormality or hernia.  Diffuse degenerative changes of the lumbar spine.  IMPRESSION: No acute obstructing ureteral calculus, hydronephrosis or obstructive uropathy on either side.  Prior cholecystectomy  Normal appendix  Sigmoid diverticulosis without acute inflammation  Uterine fibroids, partially calcified, stable.  Stable fat containing umbilical hernia.   Electronically Signed   By: Daryll Brod M.D.   On: 11/01/2014 15:21     EKG Interpretation None      MDM   Final diagnoses:  Right-sided low back pain without sciatica    Patient with recent evaluation at [facility]. She had CBC, CMP, UA  Performed late last night. CBC with no leukocytosis, CMP with no electrolyte abnormalities. UA is not consistent with UTI  Negative. Had a ultrasound of the abdomen performed which showed no acute disease process. Her abdominal exam is benign, clinical picture is not consistent with acute appendicitis. Given small amount of hemoglobin in her urine and her description of pain question renal colic. Will check CT abdomen to evaluate for stone.  CT abdomen without evidence of stone. No evidence of zoster on exam. Discussed with patient home care for pain, likely musculoskeletal in nature. Treat with when necessary Percocet, with close PCP follow-up.    Quintella Reichert, MD 11/01/14 1705

## 2014-11-01 NOTE — ED Notes (Signed)
[facility] she is still in pain and that the US performed there did not show her appendix. She believes that this pain must caused by her appendix.

## 2014-11-21 ENCOUNTER — Encounter: Payer: Self-pay | Admitting: Internal Medicine

## 2014-11-21 ENCOUNTER — Ambulatory Visit (INDEPENDENT_AMBULATORY_CARE_PROVIDER_SITE_OTHER): Payer: Medicare Other | Admitting: Internal Medicine

## 2014-11-21 VITALS — BP 130/80 | HR 80 | Temp 98.0°F | Ht 67.0 in | Wt 332.0 lb

## 2014-11-21 DIAGNOSIS — I10 Essential (primary) hypertension: Secondary | ICD-10-CM

## 2014-11-21 DIAGNOSIS — J3489 Other specified disorders of nose and nasal sinuses: Secondary | ICD-10-CM | POA: Diagnosis not present

## 2014-11-21 DIAGNOSIS — K219 Gastro-esophageal reflux disease without esophagitis: Secondary | ICD-10-CM | POA: Diagnosis not present

## 2014-11-21 DIAGNOSIS — D259 Leiomyoma of uterus, unspecified: Secondary | ICD-10-CM | POA: Diagnosis not present

## 2014-11-21 DIAGNOSIS — J329 Chronic sinusitis, unspecified: Secondary | ICD-10-CM | POA: Insufficient documentation

## 2014-11-21 DIAGNOSIS — N814 Uterovaginal prolapse, unspecified: Secondary | ICD-10-CM | POA: Diagnosis not present

## 2014-11-21 MED ORDER — POTASSIUM CHLORIDE CRYS ER 20 MEQ PO TBCR: 20.0000 meq | EXTENDED_RELEASE_TABLET | Freq: Every day | ORAL | Status: DC

## 2014-11-21 MED ORDER — AMLODIPINE BESYLATE 5 MG PO TABS: 5.0000 mg | ORAL_TABLET | Freq: Every day | ORAL | Status: DC

## 2014-11-21 MED ORDER — ESOMEPRAZOLE MAGNESIUM 40 MG PO CPDR: 40.0000 mg | DELAYED_RELEASE_CAPSULE | Freq: Every day | ORAL | Status: DC

## 2014-11-21 MED ORDER — LOSARTAN POTASSIUM 100 MG PO TABS
100.0000 mg | ORAL_TABLET | Freq: Every day | ORAL | Status: DC
Start: 2014-11-21 — End: 2015-02-05

## 2014-11-21 MED ORDER — HYDROCHLOROTHIAZIDE 25 MG PO TABS: 25.0000 mg | ORAL_TABLET | Freq: Every day | ORAL | Status: DC

## 2014-11-21 NOTE — Assessment & Plan Note (Addendum)
BP Readings from Last 3 Encounters:  11/21/14 130/80  11/01/14 156/78  10/31/14 157/85    Lab Results  Component Value Date   NA 138 10/31/2014   K 3.7 10/31/2014   CREATININE 0.69 10/31/2014    Assessment: Blood pressure control: mildly elevated 148/50 then repeat 130/80 Progress toward BP goal:  at goal Comments: none  Plan: Medications:  continue current medications Norvasc 5 mg qd, HCTZ 25 mg qd, Losartan 100 mg qd Rx refills today. Also Rx refill of Kdur Other plans: f/u in 3-4 months

## 2014-11-21 NOTE — Patient Instructions (Addendum)
General Instructions: I will refer you back to Dr. Linda Hedges  Please call and let me know when you finish the Lamisil  Take care and Happy Holidays    Treatment Goals:  Goals (1 Years of Data) as of 11/21/14          As of Today 11/01/14 11/01/14 10/31/14 08/23/14     Blood Pressure   . Blood Pressure < 140/90  148/50 156/78 168/78 157/85 143/59     Result Component   . LDL CALC < 130            Progress Toward Treatment Goals:  Treatment Goal 11/21/2014  Blood pressure at goal  Prevent falls -    Self Care Goals & Plans:  Self Care Goal 11/21/2014  Manage my medications take my medicines as prescribed; bring my medications to every visit; refill my medications on time; follow the sick day instructions if I am sick  Monitor my health keep track of my blood pressure  Eat healthy foods drink diet soda or water instead of juice or soda; eat more vegetables; eat foods that are low in salt; eat baked foods instead of fried foods; eat fruit for snacks and desserts; eat smaller portions  Be physically active find an activity I enjoy  Meeting treatment goals maintain the current self-care plan    No flowsheet data found.   Care Management & Community Referrals:  Referral 11/21/2014  Referrals made for care management support none needed  Referrals made to community resources none       Fibroids Fibroids are lumps (tumors) that can occur any place in a woman's body. These lumps are not cancerous. Fibroids vary in size, weight, and where they grow. HOME CARE  Do not take aspirin.  Write down the number of pads or tampons you use during your period. Tell your doctor. This can help determine the best treatment for you. GET HELP RIGHT AWAY IF:  You have pain in your lower belly (abdomen) that is not helped with medicine.  You have cramps that are not helped with medicine.  You have more bleeding between or during your period.  You feel lightheaded or pass out  (faint).  Your lower belly pain gets worse. MAKE SURE YOU:  Understand these instructions.  Will watch your condition.  Will get help right away if you are not doing well or get worse. Document Released: 12/19/2010 Document Revised: 02/08/2012 Document Reviewed: 12/19/2010 [facility] Patient Information 2015 [place], [place]. This information is not intended to replace advice given to you by your health care provider. Make sure you discuss any questions you have with your health care provider. Urinary Incontinence Urinary incontinence is the involuntary loss of urine from your bladder. CAUSES  There are many causes of urinary incontinence. They include:  Medicines.  Infections.  Prostatic enlargement, leading to overflow of urine from your bladder.  Surgery.  Neurological diseases.  Emotional factors. SIGNS AND SYMPTOMS Urinary Incontinence can be divided into four types:  Urge incontinence. Urge incontinence is the involuntary loss of urine before you have the opportunity to go to the bathroom. There is a sudden urge to void but not enough time to reach a bathroom.  Stress incontinence. Stress incontinence is the sudden loss of urine with any activity that forces urine to pass. It is commonly caused by anatomical changes to the pelvis and sphincter areas of your body.  Overflow incontinence. Overflow incontinence is the loss of urine from an obstructed opening to  your bladder. This results in a backup of urine and a resultant buildup of pressure within the bladder. When the pressure within the bladder exceeds the closing pressure of the sphincter, the urine overflows, which causes incontinence, similar to water overflowing a dam.  Total incontinence. Total incontinence is the loss of urine as a result of the inability to store urine within your bladder. DIAGNOSIS  Evaluating the cause of incontinence may require:  A thorough and complete medical and obstetric history.  A  complete physical exam.  Laboratory tests such as a urine culture and sensitivities. When additional tests are indicated, they can include:  An ultrasound exam.  Kidney and bladder X-rays.  Cystoscopy. This is an exam of the bladder using a narrow scope.  Urodynamic testing to test the nerve function to the bladder and sphincter areas. TREATMENT  Treatment for urinary incontinence depends on the cause:  For urge incontinence caused by a bacterial infection, antibiotics will be prescribed. If the urge incontinence is related to medicines you take, your health care provider may have you change the medicine.  For stress incontinence, surgery to re-establish anatomical support to the bladder or sphincter, or both, will often correct the condition.  For overflow incontinence caused by an enlarged prostate, an operation to open the channel through the enlarged prostate will allow the flow of urine out of the bladder. In women with fibroids, a hysterectomy may be recommended.  For total incontinence, surgery on your urinary sphincter may help. An artificial urinary sphincter (an inflatable cuff placed around the urethra) may be required. In women who have developed a hole-like passage between their bladder and vagina (vesicovaginal fistula), surgery to close the fistula often is required. HOME CARE INSTRUCTIONS  Normal daily hygiene and the use of pads or adult diapers that are changed regularly will help prevent odors and skin damage.  Avoid caffeine. It can overstimulate your bladder.  Use the bathroom regularly. Try about every 2-3 hours to go to the bathroom, even if you do not feel the need to do so. Take time to empty your bladder completely. After urinating, wait a minute. Then try to urinate again.  For causes involving nerve dysfunction, keep a log of the medicines you take and a journal of the times you go to the bathroom. SEEK MEDICAL CARE IF:  You experience worsening of pain  instead of improvement in pain after your procedure.  Your incontinence becomes worse instead of better. SEE IMMEDIATE MEDICAL CARE IF:  You experience fever or shaking chills.  You are unable to pass your urine.  You have redness spreading into your groin or down into your thighs. MAKE SURE YOU:   Understand these instructions.   Will watch your condition.  Will get help right away if you are not doing well or get worse. Document Released: 12/24/2004 Document Revised: 09/06/2013 Document Reviewed: 04/25/2013 [facility] Patient Information 2015 [place], [place]. This information is not intended to replace advice given to you by your health care provider. Make sure you discuss any questions you have with your health care provider.

## 2014-11-21 NOTE — Assessment & Plan Note (Signed)
Referred back to OB/GYN Dr. Linda Hedges [facility] Try kegel exercises to help with urinary incontinence

## 2014-11-21 NOTE — Assessment & Plan Note (Signed)
Referred back to OB/GYN to disc tx options

## 2014-11-21 NOTE — Assessment & Plan Note (Signed)
Likely viral  Continue nasal saline RTC if not improving after 3 weeks

## 2014-11-21 NOTE — Progress Notes (Signed)
   Subjective:    Patient ID: Amy Jarvis, female    DOB: 07-25-1961, 53 y.o.   MRN: [id number]  HPI Comments: 53 y.o PMH uterine prolapse, urinary incontinence, telogen effluviium, endometrial polyp s/p removal, onychomycosis, chronic leg edema, HTN, HLD, GERD, fibroid uterus, diverticulosis, DDD  She presents for f/u  1. Recently seen in ED for right groin pain which radiated to right flank and back. Reviewed [name] neg only + mild fatty liver, CT renal neg stone +sigmoid diverticulosis, fibroids, umbilical hernia. CMET and CBC wnl on 10/31/14.  Pain is improved.  She was given Percocet in the ED. Pain is now 2-3/10.   2. Onychomycosis-she has about 20-30 pills of Lamisil left and has not completed the 12 week course yet.  3. Recently had head congestion and coughing yellow to blood tinged phelgm and nasal contents when blowing her nose last week.  Exposed to sick grandchild. She tried OTC nasal saline which has provided some relief and sx's and sputum production are better.  She initially had chills which are resolved and denies fever.  She does use cpap at night and tries to humidify it.    4. HTN-BP 148/50 then 130/80 on Norvasc 5, HCTZ 25 mg qd, Losartan 100 mg qd  5. H/o uterine prolapse and urinary incontinence at times going q30 min.  She had UA 12/2 not signficant.  She is agreeable to go to see Dr. Linda Hedges again at [facility].    6. She needs Rx refills of medications.  Called pharm and they stated pt had RF x 1 on all medications so resent Rx        Review of Systems  HENT: Positive for congestion. Negative for sore throat.        Resolved sore throat   Respiratory: Negative for shortness of breath.   Cardiovascular: Positive for leg swelling. Negative for chest pain.  Gastrointestinal: Negative for constipation.  Genitourinary: Negative for dysuria.       Objective:   Physical Exam  Constitutional: She is oriented to person, place, and time. Vital signs  are normal. She appears well-developed and well-nourished. She is cooperative. No distress.  HENT:  Head: Normocephalic and atraumatic.  Mouth/Throat: Oropharynx is clear and moist and mucous membranes are normal. No oropharyngeal exudate.  Mild frontal sinus ttp  Mild left maxillary sinus ttp  Eyes: Conjunctivae are normal. Pupils are equal, round, and reactive to light. Right eye exhibits no discharge. Left eye exhibits no discharge. No scleral icterus.  Cardiovascular: Normal rate, regular rhythm, S1 normal, S2 normal and normal heart sounds.   No murmur heard. 2+ leg edema   Pulmonary/Chest: Effort normal and breath sounds normal. No respiratory distress. She has no wheezes.  Abdominal: Soft. Bowel sounds are normal. There is no tenderness.  No sig ttp ab RLQ, groin area   Neurological: She is alert and oriented to person, place, and time. Gait normal.  Skin: Skin is warm, dry and intact. No rash noted. She is not diaphoretic.  Psychiatric: She has a normal mood and affect. Her speech is normal and behavior is normal. Judgment and thought content normal. Cognition and memory are normal.  Nursing note and vitals reviewed.         Assessment & Plan:  F/u in 3-4 months, sooner if needed

## 2014-11-21 NOTE — Assessment & Plan Note (Signed)
Encouraged elevation

## 2014-11-21 NOTE — Assessment & Plan Note (Signed)
Pt will complete Lamisil and come back to clinic when finished for lab draw only CMET after 12 week course

## 2014-11-21 NOTE — Assessment & Plan Note (Signed)
Rx refill of Nexium

## 2014-11-22 NOTE — Progress Notes (Signed)
Case discussed with Dr. Aundra Dubin at the time the resident saw the patient.  We reviewed the resident's history and exam and pertinent patient test results.  I agree with the assessment, diagnosis, and plan of care documented in the resident's note.

## 2014-11-22 NOTE — Addendum Note (Signed)
Addended by: Oval Linsey D on: 11/22/2014 08:58 AM   Modules accepted: Level of Service

## 2015-01-22 DIAGNOSIS — H538 Other visual disturbances: Secondary | ICD-10-CM | POA: Diagnosis not present

## 2015-01-22 DIAGNOSIS — H2511 Age-related nuclear cataract, right eye: Secondary | ICD-10-CM | POA: Diagnosis not present

## 2015-01-22 DIAGNOSIS — H18411 Arcus senilis, right eye: Secondary | ICD-10-CM | POA: Diagnosis not present

## 2015-02-05 ENCOUNTER — Encounter: Payer: Self-pay | Admitting: Internal Medicine

## 2015-02-05 ENCOUNTER — Ambulatory Visit (INDEPENDENT_AMBULATORY_CARE_PROVIDER_SITE_OTHER): Payer: Medicare Other | Admitting: Internal Medicine

## 2015-02-05 VITALS — BP 155/70 | HR 82 | Temp 97.8°F | Resp 20 | Ht 66.0 in | Wt 330.6 lb

## 2015-02-05 DIAGNOSIS — J0110 Acute frontal sinusitis, unspecified: Secondary | ICD-10-CM

## 2015-02-05 DIAGNOSIS — K219 Gastro-esophageal reflux disease without esophagitis: Secondary | ICD-10-CM | POA: Diagnosis not present

## 2015-02-05 DIAGNOSIS — I10 Essential (primary) hypertension: Secondary | ICD-10-CM | POA: Diagnosis not present

## 2015-02-05 LAB — BASIC METABOLIC PANEL WITH GFR
BUN: 10 mg/dL (ref 6–23)
CHLORIDE: 101 meq/L (ref 96–112)
CO2: 28 mEq/L (ref 19–32)
Calcium: 9.6 mg/dL (ref 8.4–10.5)
Creat: 0.63 mg/dL (ref 0.50–1.10)
GFR, Est African American: >89 mL/min
GFR, Est Non African American: >89 mL/min
Glucose, Bld: 78 mg/dL (ref 70–99)
Potassium: 4.2 mEq/L (ref 3.5–5.3)
SODIUM: 139 meq/L (ref 135–145)

## 2015-02-05 MED ORDER — LORATADINE 10 MG PO TABS
10.0000 mg | ORAL_TABLET | Freq: Every day | ORAL | Status: DC
Start: 2015-02-05 — End: 2015-05-02

## 2015-02-05 MED ORDER — FLUTICASONE PROPIONATE 50 MCG/ACT NA SUSP: 1.0000 | Freq: Every day | NASAL | Status: DC

## 2015-02-05 MED ORDER — LOSARTAN POTASSIUM 100 MG PO TABS
100.0000 mg | ORAL_TABLET | Freq: Every day | ORAL | Status: DC
Start: 2015-02-05 — End: 2015-05-02

## 2015-02-05 MED ORDER — HYDROCHLOROTHIAZIDE 25 MG PO TABS: 25.0000 mg | ORAL_TABLET | Freq: Every day | ORAL | Status: DC

## 2015-02-05 MED ORDER — ESOMEPRAZOLE MAGNESIUM 40 MG PO CPDR: 40.0000 mg | DELAYED_RELEASE_CAPSULE | Freq: Every day | ORAL | Status: DC

## 2015-02-05 MED ORDER — POTASSIUM CHLORIDE CRYS ER 20 MEQ PO TBCR: 20.0000 meq | EXTENDED_RELEASE_TABLET | Freq: Every day | ORAL | Status: DC

## 2015-02-05 MED ORDER — AZITHROMYCIN 500 MG PO TABS: 500.0000 mg | ORAL_TABLET | Freq: Every day | ORAL | Status: AC

## 2015-02-05 NOTE — Progress Notes (Signed)
Patient ID: [name], female   DOB: Sep 06, 1961, 54 y.o.   MRN: [id number]  Subjective:   Patient ID: Amy Jarvis female   DOB: [birth year] 54 y.o.   MRN: [id number]  HPI: [name] is a 54 y.o. F w/ PMH PMH uterine prolapse, urinary incontinence, telogen effluviium, endometrial polyp s/p removal, onychomycosis, chronic leg edema, HTN, HLD, GERD, fibroid uterus, diverticulosis, and DDD who presents with sinus complaints.   She states that for the past 3 weeks she has had sinus congestion with yellow mucus and pain in her forehead and across her cheeks. She has been taking Benadryl with only temporary relief.  She has been using nasal saline spray with some relief. She states that she has been feeling run down with low energy. She endorses low grade fevers of 99, chills.     Past Medical History  Diagnosis Date  . Hyperlipidemia LDL goal < 130 09/25/2008  . Morbid obesity 08/28/2010  . Obstructive sleep apnea on CPAP 06/20/2010  . Hypertension 09/25/2008  . GERD 09/05/2009  . Diverticulosis 03/26/2010  . Microscopic hematuria 01/13/2010  . Cystocele 01/13/2010  . Uterine prolapse 11/19/2008  . Perimenopausal 01/13/2010  . Vertigo 10/07/2012  . Degenerative disk disease 10/07/2012  . Cervical polyp     possible history of cervical polyp   Current Outpatient Prescriptions  Medication Sig Dispense Refill  . acetaminophen (TYLENOL) 500 MG tablet Take 1 tablet (500 mg total) by mouth every 4 (four) hours as needed. 180 tablet 0  . acetaminophen (TYLENOL) 500 MG tablet Take 500 mg by mouth every 6 (six) hours as needed for mild pain or headache.    Marland Kitchen amLODipine (NORVASC) 5 MG tablet Take 1 tablet (5 mg total) by mouth daily. 30 tablet 2  . BIOTIN PO Take 1 tablet by mouth daily.    . Blood Pressure KIT 1 kit by Does not apply route daily. 1 each 0  . esomeprazole (NEXIUM) 40 MG capsule Take 1 capsule (40 mg total) by mouth daily before breakfast. 30 capsule 2  .  hydrochlorothiazide (HYDRODIURIL) 25 MG tablet Take 1 tablet (25 mg total) by mouth daily. 30 tablet 2  . hydrocortisone 2.5 % ointment Apply topically 2 (two) times daily. (Patient not taking: Reported on 11/21/2014) 454 g 0  . ibuprofen (ADVIL,MOTRIN) 800 MG tablet Take 1 tablet (800 mg total) by mouth every 8 (eight) hours as needed. 30 tablet 0  . losartan (COZAAR) 100 MG tablet Take 1 tablet (100 mg total) by mouth daily. 30 tablet 2  . Magnesium 250 MG TABS Take 1 tablet by mouth daily.    . meclizine (ANTIVERT) 25 MG tablet Take 1 tablet (25 mg total) by mouth 3 (three) times daily as needed. (Patient not taking: Reported on 11/01/2014) 30 tablet 2  . Multiple Vitamin (MULTIVITAMIN WITH MINERALS) TABS Take 1 tablet by mouth every other day.    . Omega-3 Fatty Acids (FISH OIL) 1000 MG CAPS Take 1 capsule by mouth daily.    Marland Kitchen oxyCODONE-acetaminophen (PERCOCET/ROXICET) 5-325 MG per tablet Take 1 tablet by mouth every 6 (six) hours as needed for moderate pain or severe pain. 8 tablet 0  . potassium chloride SA (K-DUR,KLOR-CON) 20 MEQ tablet Take 1 tablet (20 mEq total) by mouth daily. 30 tablet 2  . Tetrahydroz-Dextran-PEG-Povid (VISINE ADVANCED RELIEF) 0.05-0.1-1-1 % SOLN Apply 2 drops to eye daily as needed (for dry eyes).     No current facility-administered medications for this visit.  Family History  Problem Relation Age of Onset  . Colon cancer Mother 68  . Hypertension Mother   . Stroke Mother 29  . Breast cancer Other     maternal great-aunt  . Cancer Other     maternal great aunt with breast cancer   History   Social History  . Marital Status: Divorced    Spouse Name: N/A  . Number of Children: 3  . Years of Education: N/A   Occupational History  .     Social History Main Topics  . Smoking status: Never Smoker   . Smokeless tobacco: Never Used  . Alcohol Use: No  . Drug Use: No  . Sexual Activity: Not Currently   Other Topics Concern  . None   Social History  Narrative   Review of Systems: Constitutional: +fever, chills, malaise, and fatigue.  HEENT: Denies photophobia, eye pain. + dry eyes, congestion, rhinorrhea.   Respiratory: +SOB, DOE. Denies wheezing Cardiovascular: Denies chest pain, palpitations. + chronic leg swelling.  Gastrointestinal: Denies nausea, vomiting, abdominal pain, diarrhea. + chronic constipation Genitourinary: Denies dysuria, urgency, frequency Musculoskeletal: + chronic back pain, Skin: Denies rash or wound.  Neurological: +dizziness and "fogginess" in thinking.  Psychiatric/Behavioral: Denies mood changes or confusion  Objective:  Physical Exam: Filed Vitals:   02/05/15 1514  BP: 165/60  Pulse: 82  Temp: 97.8 F (36.6 C)  TempSrc: Oral  Resp: 20  Height: _0  (1.676 m)  Weight: 330 lb 9.6 oz (149.959 kg)  SpO2: 100%   Constitutional: Vital signs reviewed.  Patient is a well-developed and well-nourished female in no acute distress and cooperative with exam. Alert and oriented x3.  Head: Normocephalic and atraumatic. Facial tenderness at frontal sinus and bridge of nose. Mild maxillary tenderness on palpation.  Nose: +erythema with thin drainage and some areas of friable mucosa noted.  Turbinates not edematous. Mouth: No erythema or exudates, MMM. Difficulty visualizing posterior oropharynx Eyes: EOMI. No scleral icterus.  Neck: Supple, Trachea midline, normal ROM, No JVD, mass, thyromegaly.  Cardiovascular: RRR, no MRG, pulses symmetric and intact bilaterally Pulmonary/Chest: Normal respiratory effort, CTAB, no wheezes, rales, or rhonchi Abdominal: Soft. Non-tender, non-distended, bowel sounds are normal, Musculoskeletal: No joint deformities Hematology: +sublingual adenopathy.  Neurological: A&O x3, Strength is normal and symmetric bilaterally, cranial nerve II-XII are grossly intact, no focal motor deficit.  Skin: Warm, dry and intact.  Psychiatric: Normal mood and affect. Speech and behavior is normal.    Assessment & Plan:   Please refer to Problem List based Assessment and Plan

## 2015-02-05 NOTE — Patient Instructions (Addendum)
Take the Azithromycin 500mg  (1 tablet) daily for 3 days.   Begin taking the Claritin daily and continue to use the nasal saline. After you use the saline, blow your nose really well, and then use Flonase 1-2 sprays in each nostril daily.  Please call the clinic if you do not feel better.      General Instructions:   Please bring your medicines with you each time you come to clinic.  Medicines may include prescription medications, over-the-counter medications, herbal remedies, eye drops, vitamins, or other pills.   Progress Toward Treatment Goals:  Treatment Goal 11/21/2014  Blood pressure at goal  Prevent falls -    Self Care Goals & Plans:  Self Care Goal 02/05/2015  Manage my medications take my medicines as prescribed; bring my medications to every visit; refill my medications on time  Monitor my health keep track of my blood pressure  Eat healthy foods eat foods that are low in salt  Be physically active find an activity I enjoy  Meeting treatment goals -    No flowsheet data found.   Care Management & Community Referrals:  Referral 11/21/2014  Referrals made for care management support none needed  Referrals made to community resources none

## 2015-02-05 NOTE — Assessment & Plan Note (Signed)
Refilled Nexium 

## 2015-02-05 NOTE — Assessment & Plan Note (Signed)
BP elevated to 155/70 today. Pt on Amlodipine 5mg , HCTZ 25mg , and Losartan 100mg  daily. 2 months ago her BP was 130/80. Suspect that her BP is elevated in the setting of infection. Treating sinusitis. - F/u in 2 weeks for BP recheck. - Checking BMP today to assess potassium and renal function.

## 2015-02-05 NOTE — Assessment & Plan Note (Signed)
Sinus tenderness to palpation at frontal and maxillary sinuses. Yellow drainage and sinus pressure x3 weeks with rhinorrhea. Pt has a h/o seasonal allergies. Suspect that she has a sinus infection - Azithromycin 500mg  daily x3 days - Claritin and Ocean followed by Flonase 1-2 sprays in each nostril daily - pt asked to call the clinic if her symptoms do not improve after the abx.

## 2015-02-07 NOTE — Progress Notes (Signed)
Internal Medicine Clinic Attending  Case discussed with Dr. Glenn at the time of the visit.  We reviewed the resident's history and exam and pertinent patient test results.  I agree with the assessment, diagnosis, and plan of care documented in the resident's note.  

## 2015-02-13 ENCOUNTER — Encounter: Payer: Self-pay | Admitting: Gastroenterology

## 2015-02-21 ENCOUNTER — Other Ambulatory Visit: Payer: Self-pay | Admitting: *Deleted

## 2015-02-21 DIAGNOSIS — I10 Essential (primary) hypertension: Secondary | ICD-10-CM

## 2015-02-21 MED ORDER — AMLODIPINE BESYLATE 5 MG PO TABS: 5.0000 mg | ORAL_TABLET | Freq: Every day | ORAL | Status: DC

## 2015-03-28 ENCOUNTER — Encounter: Payer: Self-pay | Admitting: Gastroenterology

## 2015-04-17 ENCOUNTER — Encounter: Payer: Self-pay | Admitting: *Deleted

## 2015-05-02 ENCOUNTER — Ambulatory Visit (INDEPENDENT_AMBULATORY_CARE_PROVIDER_SITE_OTHER): Payer: Medicare Other | Admitting: Internal Medicine

## 2015-05-02 ENCOUNTER — Encounter: Payer: Self-pay | Admitting: Internal Medicine

## 2015-05-02 VITALS — BP 118/45 | HR 62 | Temp 98.4°F | Ht 66.0 in | Wt 325.8 lb

## 2015-05-02 DIAGNOSIS — J302 Other seasonal allergic rhinitis: Secondary | ICD-10-CM

## 2015-05-02 DIAGNOSIS — I10 Essential (primary) hypertension: Secondary | ICD-10-CM

## 2015-05-02 DIAGNOSIS — G894 Chronic pain syndrome: Secondary | ICD-10-CM | POA: Diagnosis not present

## 2015-05-02 DIAGNOSIS — R42 Dizziness and giddiness: Secondary | ICD-10-CM | POA: Diagnosis not present

## 2015-05-02 MED ORDER — LOSARTAN POTASSIUM 100 MG PO TABS: 100.0000 mg | ORAL_TABLET | Freq: Every day | ORAL | Status: DC

## 2015-05-02 MED ORDER — LORATADINE 10 MG PO TABS: 10.0000 mg | ORAL_TABLET | Freq: Every day | ORAL | Status: DC

## 2015-05-02 MED ORDER — FLUTICASONE PROPIONATE 50 MCG/ACT NA SUSP: 1.0000 | Freq: Every day | NASAL | Status: DC

## 2015-05-02 MED ORDER — AMLODIPINE BESYLATE 5 MG PO TABS: 10.0000 mg | ORAL_TABLET | Freq: Every day | ORAL | Status: DC

## 2015-05-02 MED ORDER — MECLIZINE HCL 25 MG PO TABS: 25.0000 mg | ORAL_TABLET | Freq: Three times a day (TID) | ORAL | Status: DC | PRN

## 2015-05-02 MED ORDER — HYDROCHLOROTHIAZIDE 25 MG PO TABS: 25.0000 mg | ORAL_TABLET | Freq: Every day | ORAL | Status: DC

## 2015-05-02 MED ORDER — AMLODIPINE BESYLATE 5 MG PO TABS: 5.0000 mg | ORAL_TABLET | Freq: Every day | ORAL | Status: DC

## 2015-05-02 NOTE — Patient Instructions (Addendum)
General Instructions: Repeat BP 118/45 great Please take Norvasc 5 mg daily along with other blood pressure medications. If your blood pressure is >140 we may need to take 10 mg qd  Take care Follow up in 3 months for blood pressure and log blood pressure at home    Treatment Goals:  Goals (1 Years of Data) as of 05/02/15          As of Today 02/05/15 02/05/15 11/21/14 11/21/14     Blood Pressure   . Blood Pressure < 140/90  153/65 155/70 165/60 130/80 148/50     Result Component   . LDL CALC < 130            Progress Toward Treatment Goals:  Treatment Goal 05/02/2015  Blood pressure unchanged  Prevent falls -    Self Care Goals & Plans:  Self Care Goal 05/02/2015  Manage my medications take my medicines as prescribed; bring my medications to every visit; refill my medications on time; follow the sick day instructions if I am sick  Monitor my health keep track of my blood pressure  Eat healthy foods drink diet soda or water instead of juice or soda; eat more vegetables; eat foods that are low in salt; eat baked foods instead of fried foods; eat fruit for snacks and desserts  Be physically active find an activity I enjoy  Meeting treatment goals maintain the current self-care plan    No flowsheet data found.   Care Management & Community Referrals:  Referral 05/02/2015  Referrals made for care management support -  Referrals made to community resources none       Back Pain, Adult Low back pain is very common. About 1 in 5 people have back pain.The cause of low back pain is rarely dangerous. The pain often gets better over time.About half of people with a sudden onset of back pain feel better in just 2 weeks. About 8 in 10 people feel better by 6 weeks.  CAUSES Some common causes of back pain include:  Strain of the muscles or ligaments supporting the spine.  Wear and tear (degeneration) of the spinal discs.  Arthritis.  Direct injury to the back. DIAGNOSIS Most of  the time, the direct cause of low back pain is not known.However, back pain can be treated effectively even when the exact cause of the pain is unknown.Answering your caregiver's questions about your overall health and symptoms is one of the most accurate ways to make sure the cause of your pain is not dangerous. If your caregiver needs more information, he or she may order lab work or imaging tests (X-rays or MRIs).However, even if imaging tests show changes in your back, this usually does not require surgery. HOME CARE INSTRUCTIONS For many people, back pain returns.Since low back pain is rarely dangerous, it is often a condition that people can learn to [facility] their own.   Remain active. It is stressful on the back to sit or stand in one place. Do not sit, drive, or stand in one place for more than 30 minutes at a time. Take short walks on level surfaces as soon as pain allows.Try to increase the length of time you walk each day.  Do not stay in bed.Resting more than 1 or 2 days can delay your recovery.  Do not avoid exercise or work.Your body is made to move.It is not dangerous to be active, even though your back may hurt.Your back will likely heal faster if you  return to being active before your pain is gone.  Pay attention to your body when you bend and lift. Many people have less discomfortwhen lifting if they bend their knees, keep the load close to their bodies,and avoid twisting. Often, the most comfortable positions are those that put less stress on your recovering back.  Find a comfortable position to sleep. Use a firm mattress and lie on your side with your knees slightly bent. If you lie on your back, put a pillow under your knees.  Only take over-the-counter or prescription medicines as directed by your caregiver. Over-the-counter medicines to reduce pain and inflammation are often the most helpful.Your caregiver may prescribe muscle relaxant drugs.These medicines help  dull your pain so you can more quickly return to your normal activities and healthy exercise.  Put ice on the injured area.  Put ice in a plastic bag.  Place a towel between your skin and the bag.  Leave the ice on for 15-20 minutes, 03-04 times a day for the first 2 to 3 days. After that, ice and heat may be alternated to reduce pain and spasms.  Ask your caregiver about trying back exercises and gentle massage. This may be of some benefit.  Avoid feeling anxious or stressed.Stress increases muscle tension and can worsen back pain.It is important to recognize when you are anxious or stressed and learn ways to manage it.Exercise is a great option. SEEK MEDICAL CARE IF:  You have pain that is not relieved with rest or medicine.  You have pain that does not improve in 1 week.  You have new symptoms.  You are generally not feeling well. SEEK IMMEDIATE MEDICAL CARE IF:   You have pain that radiates from your back into your legs.  You develop new bowel or bladder control problems.  You have unusual weakness or numbness in your arms or legs.  You develop nausea or vomiting.  You develop abdominal pain.  You feel faint. Document Released: 11/16/2005 Document Revised: 05/17/2012 Document Reviewed: 03/20/2014 [facility] Patient Information 2015 [place], [place]. This information is not intended to replace advice given to you by your health care provider. Make sure you discuss any questions you have with your health care provider.  Hypertension Hypertension, commonly called high blood pressure, is when the force of blood pumping through your arteries is too strong. Your arteries are the blood vessels that carry blood from your heart throughout your body. A blood pressure reading consists of a higher number over a lower number, such as 110/72. The higher number (systolic) is the pressure inside your arteries when your heart pumps. The lower number (diastolic) is the pressure inside your  arteries when your heart relaxes. Ideally you want your blood pressure below 120/80. Hypertension forces your heart to work harder to pump blood. Your arteries may become narrow or stiff. Having hypertension puts you at risk for heart disease, stroke, and other problems.  RISK FACTORS Some risk factors for high blood pressure are controllable. Others are not.  Risk factors you cannot control include:   Race. You may be at higher risk if you are African American.  Age. Risk increases with age.  Gender. Men are at higher risk than women before age 63 years. After age 22, women are at higher risk than men. Risk factors you can control include:  Not getting enough exercise or physical activity.  Being overweight.  Getting too much fat, sugar, calories, or salt in your diet.  Drinking too much alcohol. SIGNS  AND SYMPTOMS Hypertension does not usually cause signs or symptoms. Extremely high blood pressure (hypertensive crisis) may cause headache, anxiety, shortness of breath, and nosebleed. DIAGNOSIS  To check if you have hypertension, your health care provider will measure your blood pressure while you are seated, with your arm held at the level of your heart. It should be measured at least twice using the same arm. Certain conditions can cause a difference in blood pressure between your right and left arms. A blood pressure reading that is higher than normal on one occasion does not mean that you need treatment. If one blood pressure reading is high, ask your health care provider about having it checked again. TREATMENT  Treating high blood pressure includes making lifestyle changes and possibly taking medicine. Living a healthy lifestyle can help lower high blood pressure. You may need to change some of your habits. Lifestyle changes may include:  Following the DASH diet. This diet is high in fruits, vegetables, and whole grains. It is low in salt, red meat, and added sugars.  Getting at  least 2 hours of brisk physical activity every week.  Losing weight if necessary.  Not smoking.  Limiting alcoholic beverages.  Learning ways to reduce stress. If lifestyle changes are not enough to get your blood pressure under control, your health care provider may prescribe medicine. You may need to take more than one. Work closely with your health care provider to understand the risks and benefits. HOME CARE INSTRUCTIONS  Have your blood pressure rechecked as directed by your health care provider.   Take medicines only as directed by your health care provider. Follow the directions carefully. Blood pressure medicines must be taken as prescribed. The medicine does not work as well when you skip doses. Skipping doses also puts you at risk for problems.   Do not smoke.   Monitor your blood pressure at home as directed by your health care provider. SEEK MEDICAL CARE IF:   You think you are having a reaction to medicines taken.  You have recurrent headaches or feel dizzy.  You have swelling in your ankles.  You have trouble with your vision. SEEK IMMEDIATE MEDICAL CARE IF:  You develop a severe headache or confusion.  You have unusual weakness, numbness, or feel faint.  You have severe chest or abdominal pain.  You vomit repeatedly.  You have trouble breathing. MAKE SURE YOU:   Understand these instructions.  Will watch your condition.  Will get help right away if you are not doing well or get worse. Document Released: 11/16/2005 Document Revised: 04/02/2014 Document Reviewed: 09/08/2013 [facility] Patient Information 2015 [place], [place]. This information is not intended to replace advice given to you by your health care provider. Make sure you discuss any questions you have with your health care provider.

## 2015-05-03 ENCOUNTER — Encounter: Payer: Self-pay | Admitting: Internal Medicine

## 2015-05-03 NOTE — Progress Notes (Signed)
Subjective:    Patient ID: Amy Jarvis, female    DOB: 1961-11-04, 54 y.o.   MRN: [id number]  HPI Comments: 54 y.o PMH uterine prolapse>urinary incontinence (sees Dr. Linda Hedges again [facility] [place]), telogen effluviium, endometrial polyp s/p removal, onychomycosis, chronic leg edema, HTN, HLD, GERD, fibroid uterus, diverticulosis, DDD  She presents for f/u  1. She c/o chronic back pain since 2008.  Reviewed MRI which shows disc bulging, L2-L3, L3-4, L4-5, S1, anterolithesis, foraminal narrowing.  She states she takes Tylenol up to 1500 mg qd, and Ibuprofen 800 mg qd with some relief.  She also reports numbness in her thighs, pain radiates down L>R leg, no bowel incontinence but she does have bladder incontinence 2/2 uterine prolapse. She also reports she stumbles at times but does not walk with a cane, denies saddle anesthesia. To help she also tries stretching exercises, tried PT in the past w/o help, she tries to switch positions while lying down in bed. She denies fever or that her back pain is any worse than chronically but is worse with sitting and standing at times.  She has seen [facility] in the past Dr. Berdine Addison who is no longer there so now Dr. Ninfa Linden last seen >1 year ago at which disc'ed surgery and epidural injections in the past but currently she does not want either and will let the clinic know when she needs another referral to go back to orthopedics for her chronic back and chronic arthritic knee pain.    2. Onychomycosis-previously took Lamisil intermittently which helped some but toenails are still black but appears to be somewhat better.    3. HTN-BP 153/65 then repeat was 118/45.  On Norvasc 5, HCTZ 25 mg qd, Losartan 100 mg qd. She has was out of Norvasc a couple of days due to needing to pick it up from the pharmacy   4. She needs Rx refills of medications today as well (i.e Meclizine which helps her vertigo. She previously tried vestibular PT which  they gave her some exercises to do as well)         Review of Systems  Respiratory: Negative for shortness of breath.   Cardiovascular: Positive for leg swelling. Negative for chest pain.  Gastrointestinal: Negative for abdominal pain.  Musculoskeletal: Positive for back pain and arthralgias.       Objective:   Physical Exam  Constitutional: She is oriented to person, place, and time. Vital signs are normal. She appears well-developed and well-nourished. She is cooperative. No distress.  HENT:  Head: Normocephalic and atraumatic.  Mouth/Throat: Oropharynx is clear and moist and mucous membranes are normal. No oropharyngeal exudate.  Eyes: Conjunctivae are normal. Right eye exhibits no discharge. Left eye exhibits no discharge. No scleral icterus.  Cardiovascular: Normal rate, regular rhythm, S1 normal, S2 normal and normal heart sounds.   No murmur heard. 2+ edema to legs L min. More swollen >R chronically  Pulmonary/Chest: Effort normal and breath sounds normal. No respiratory distress. She has no wheezes.  Abdominal: Soft. Bowel sounds are normal. There is no tenderness.  Obese   Musculoskeletal:       Back:  Neurological: She is alert and oriented to person, place, and time. She has normal strength. No sensory deficit.  Gait appears normal, sensation intact lower extremities b/l, strength 5/5 b/l lower ext   Skin: Skin is warm, dry and intact. No rash noted. She is not diaphoretic.  Psychiatric: She has a normal mood and affect.  Her speech is normal and behavior is normal. Judgment and thought content normal. Cognition and memory are normal.  Very pleasant   Nursing note and vitals reviewed.         Assessment & Plan:  F/u in 3 months

## 2015-05-03 NOTE — Assessment & Plan Note (Signed)
BP Readings from Last 3 Encounters:  05/02/15 153/65  02/05/15 155/70  11/21/14 130/80    Lab Results  Component Value Date   NA 139 02/05/2015   K 4.2 02/05/2015   CREATININE 0.63 02/05/2015    Assessment: Blood pressure control: mildly elevated Progress toward BP goal:  unchanged Comments: repeat BP in clinic was 118/45   Plan: Medications:  continue current medications Norvasc 5, HCTZ 25, Cozaar 100. Due to initial BP was going to increase Norvasc to 10 but with repeat BP will keep at 5 mg. Pt to log BP at home and it remains consistently elevated increase Norvasc to 10 mg in future Educational resources provided: other (see comments) Self management tools provided: home blood pressure logbook Other plans: Previous BMET 01/2015 wnl

## 2015-05-03 NOTE — Assessment & Plan Note (Addendum)
Rx refill Meclizine prn

## 2015-05-03 NOTE — Assessment & Plan Note (Addendum)
Noted in back, knees No alarms signs  Pt will f/u with [facility] if worsening for now continue prn Tylenol, Advil, back exercises. PT in the past did not help back pain

## 2015-05-07 NOTE — Progress Notes (Signed)
Internal Medicine Clinic Attending  Case discussed with Dr. McLean soon after the resident saw the patient.  We reviewed the resident's history and exam and pertinent patient test results.  I agree with the assessment, diagnosis, and plan of care documented in the resident's note. 

## 2015-05-09 ENCOUNTER — Ambulatory Visit (INDEPENDENT_AMBULATORY_CARE_PROVIDER_SITE_OTHER): Payer: Medicare Other | Admitting: Internal Medicine

## 2015-05-09 VITALS — BP 135/69 | HR 47 | Temp 98.5°F | Wt 318.3 lb

## 2015-05-09 DIAGNOSIS — J069 Acute upper respiratory infection, unspecified: Secondary | ICD-10-CM

## 2015-05-09 MED ORDER — DEXTROMETHORPHAN-BENZOCAINE 5-7.5 MG MT LOZG
LOZENGE | OROMUCOSAL | Status: DC
Start: 2015-05-09 — End: 2015-10-23

## 2015-05-09 MED ORDER — GUAIFENESIN-CODEINE 100-10 MG/5ML PO SOLN: 5.0000 mL | ORAL | Status: DC | PRN

## 2015-05-09 NOTE — Assessment & Plan Note (Signed)
Patient w/ very classic symptoms of viral URI. Patient w/ sinus congestion, rhinorrhea, sore throat, dry cough (rarely productive of yellowish sputum), myalgias. Patient fulfills 0/4 Centor Criteria. On exam, lungs sound quite clear, no decreased breath sounds, rales, rhonchi, or wheezing. BP stable, not tachycardic, SpO2 98%. Given symptoms and time frame, suspect viral upper respiratory tract infection. It is very unlikely that the patient has a pneumonia given clear lungs, stable vitals, and normal SpO2. Did explain that there is always a possibility that a pneumonia may develop as a result of a preceding viral infection. Given 0/4 Centor criteria, no reason to test for strep a this time.  -Withhold antibiotic therapy at this time; supportive management -Robitussin AC for decongestant + cough suppressant -Cepacol throat lozenges -Encouraged Neti pot use -Patient is to return in 1 week to determine if resolution of symptoms. Explained that she soul call the clinic in 2-3 days if symptoms do not improve.  -If still having cough, can consider CXR at follow up if thought to be clinically indicated.

## 2015-05-09 NOTE — Patient Instructions (Signed)
1. Schedule a follow up for 1 week.   2. Please take all medications as previously prescribed with the following changes:  Start using Guaifenesin-Codeine cough syrup every 4 hours as needed for cough and congestion.   Use Cepacol throat drops for symptom relief.   Use Tylenol (up to 2000 mg daily) for pain relief. Can also take Ibuprofen (advil) intermittently as well.   Consider steam inhalation and Neti pot use (can acquire a Neti pot at your pharmacy).  3. If you have worsening of your symptoms or new symptoms arise, please call the clinic ([phone]), or go to the ER immediately if symptoms are severe.

## 2015-05-09 NOTE — Progress Notes (Signed)
Internal Medicine Clinic Attending  Case discussed with Dr. Jones at the time of the visit.  We reviewed the resident's history and exam and pertinent patient test results.  I agree with the assessment, diagnosis, and plan of care documented in the resident's note.  

## 2015-05-09 NOTE — Progress Notes (Signed)
Subjective:   Patient ID: Amy Jarvis female   DOB: 1961/10/18 54 y.o.   MRN: [id number]  HPI: [name] is a 54 y.o. female w/ PMHx of HTN, HLD, GERD, and morbid obesity, presents to the clinic today for an acute visit for upper respiratory symptoms. Patient states her symptoms have been present for about 1 week and include congestion, rhinorrhea, sinus pressure, sore throat, dry cough, myalgias, and mild dizziness. She says she has some very mild SOB at times as well. No fever, chills, nausea, vomiting. Does have some mild chest pain w/ cough. Very infrequently produces a yellowish phlegm. Has had issues w/ sinusitis in the past.   Past Medical History  Diagnosis Date  . Hyperlipidemia LDL goal < 130 09/25/2008  . Morbid obesity 08/28/2010  . Obstructive sleep apnea on CPAP 06/20/2010  . Hypertension 09/25/2008  . GERD 09/05/2009  . Diverticulosis 03/26/2010  . Microscopic hematuria 01/13/2010  . Cystocele 01/13/2010  . Uterine prolapse 11/19/2008  . Perimenopausal 01/13/2010  . Vertigo 10/07/2012  . Degenerative disk disease 10/07/2012  . Cervical polyp     possible history of cervical polyp   Current Outpatient Prescriptions  Medication Sig Dispense Refill  . acetaminophen (TYLENOL) 500 MG tablet Take 1 tablet (500 mg total) by mouth every 4 (four) hours as needed. 180 tablet 0  . amLODipine (NORVASC) 5 MG tablet Take 1 tablet (5 mg total) by mouth daily. 30 tablet 11  . BIOTIN PO Take 1 tablet by mouth daily.    . Blood Pressure KIT 1 kit by Does not apply route daily. 1 each 0  . cycloSPORINE (RESTASIS) 0.05 % ophthalmic emulsion Place 1 drop into both eyes 2 (two) times daily.    Marland Kitchen esomeprazole (NEXIUM) 40 MG capsule Take 1 capsule (40 mg total) by mouth daily before breakfast. 30 capsule 2  . fluticasone (FLONASE) 50 MCG/ACT nasal spray Place 1 spray into both nostrils daily. 16 g 2  . hydrochlorothiazide (HYDRODIURIL) 25 MG tablet Take 1 tablet (25 mg total) by  mouth daily. 30 tablet 2  . ibuprofen (ADVIL,MOTRIN) 800 MG tablet Take 1 tablet (800 mg total) by mouth every 8 (eight) hours as needed. 30 tablet 0  . loratadine (CLARITIN) 10 MG tablet Take 1 tablet (10 mg total) by mouth daily. 30 tablet 5  . losartan (COZAAR) 100 MG tablet Take 1 tablet (100 mg total) by mouth daily. 30 tablet 2  . Magnesium 250 MG TABS Take 1 tablet by mouth daily.    . meclizine (ANTIVERT) 25 MG tablet Take 1 tablet (25 mg total) by mouth 3 (three) times daily as needed. 30 tablet 2  . Multiple Vitamin (MULTIVITAMIN WITH MINERALS) TABS Take 1 tablet by mouth every other day.    . Omega-3 Fatty Acids (FISH OIL) 1000 MG CAPS Take 1 capsule by mouth daily.    . potassium chloride SA (K-DUR,KLOR-CON) 20 MEQ tablet Take 1 tablet (20 mEq total) by mouth daily. 30 tablet 2   No current facility-administered medications for this visit.    Review of Systems  General: Positive for fatigue. Denies fever, diaphoresis, appetite change.  Respiratory: Positive for dry cough. Denies SOB and wheezing.   Cardiovascular: Denies chest pain and palpitations.  Gastrointestinal: Denies nausea, vomiting, abdominal pain, and diarrhea Musculoskeletal: Positive for myalgias. Denies arthralgias, back pain, and gait problem.  Neurological: Positive for mild dizziness. Denies syncope, weakness, lightheadedness, and headaches.  Psychiatric/Behavioral: Denies mood changes, sleep disturbance, and agitation.  Objective:   Physical Exam: Filed Vitals:   05/09/15 1547 05/09/15 1600 05/09/15 1605  BP: 174/63 144/76 135/69  Pulse: 89  47  Temp: 98.5 F (36.9 C)    Weight: 318 lb 4.8 oz (144.38 kg)    SpO2: 98%      General: Obese AA female, alert, cooperative, NAD. Infrequent dry cough.  HEENT: PERRL, EOMI. Moist mucus membranes. Tympanic membranes well visualized, no erythema or effusion. Oropharynx w/ only mild erythema, no exudates.  Neck: Full range of motion without pain, supple, no  lymphadenopathy or carotid bruits Lungs: Clear to ascultation bilaterally, normal work of respiration, no wheezes, rales, rhonchi Heart: RRR, no murmurs, gallops, or rubs Abdomen: Soft, non-tender, non-distended, BS + Extremities: No cyanosis, clubbing, or edema Neurologic: Alert & oriented x3, cranial nerves II-XII intact, strength grossly intact, sensation intact to light touch   Assessment & Plan:   Please see problem based assessment and plan.

## 2015-05-12 ENCOUNTER — Emergency Department (INDEPENDENT_AMBULATORY_CARE_PROVIDER_SITE_OTHER): Payer: Medicare Other

## 2015-05-12 ENCOUNTER — Encounter (HOSPITAL_COMMUNITY): Payer: Self-pay | Admitting: Emergency Medicine

## 2015-05-12 ENCOUNTER — Emergency Department (INDEPENDENT_AMBULATORY_CARE_PROVIDER_SITE_OTHER)
Admission: EM | Admit: 2015-05-12 | Discharge: 2015-05-12 | Disposition: A | Discharge status: Home / Self Care | Payer: Medicare Other | Source: Home / Self Care | Attending: Emergency Medicine | Admitting: Emergency Medicine

## 2015-05-12 DIAGNOSIS — J189 Pneumonia, unspecified organism: Secondary | ICD-10-CM

## 2015-05-12 DIAGNOSIS — R05 Cough: Secondary | ICD-10-CM | POA: Diagnosis not present

## 2015-05-12 MED ORDER — PREDNISONE 50 MG PO TABS: ORAL_TABLET | ORAL | Status: DC

## 2015-05-12 MED ORDER — SODIUM CHLORIDE 0.9 % IN NEBU
INHALATION_SOLUTION | RESPIRATORY_TRACT | Status: AC
  Filled 2015-05-12: qty 3

## 2015-05-12 MED ORDER — BENZONATATE 100 MG PO CAPS: 100.0000 mg | ORAL_CAPSULE | Freq: Three times a day (TID) | ORAL | Status: DC | PRN

## 2015-05-12 MED ORDER — LEVOFLOXACIN 500 MG PO TABS: 500.0000 mg | ORAL_TABLET | Freq: Every day | ORAL | Status: DC

## 2015-05-12 MED ORDER — IPRATROPIUM-ALBUTEROL 0.5-2.5 (3) MG/3ML IN SOLN
RESPIRATORY_TRACT | Status: AC
  Filled 2015-05-12: qty 3

## 2015-05-12 MED ORDER — IPRATROPIUM-ALBUTEROL 0.5-2.5 (3) MG/3ML IN SOLN
3.0000 mL | Freq: Once | RESPIRATORY_TRACT | Status: AC
  Administered 2015-05-12: 3 mL via RESPIRATORY_TRACT

## 2015-05-12 NOTE — ED Provider Notes (Signed)
CSN: [id number]     Arrival date & time 05/12/15  1328 History   First MD Initiated Contact with Patient 05/12/15 1339     Chief Complaint  Patient presents with  . Cough  . Shortness of Breath   (Consider location/radiation/quality/duration/timing/severity/associated sxs/prior Treatment) HPI  She is a 54 year old woman here for evaluation of cough. She states her symptoms started about 6 days ago with nasal congestion, rhinorrhea, postnasal drip, sinus pressure, decreased appetite, fatigue. She also reports low-grade fevers. She was seen at her doctor's office on Thursday and diagnosed with an upper respiratory infection. Symptomatic care was discussed at that visit. She is here today because her symptoms are getting worse. Primarily, her cough has worsened and is more productive. She also reports feeling short of breath. She continues to have significant nasal drainage, sinus pressure, sinus pain. She reports some nausea, but no vomiting or diarrhea.  She has tried some Tylenol with codeine for the cough, but states this makes her feel like she has a hangover the next day.  Past Medical History  Diagnosis Date  . Hyperlipidemia LDL goal < 130 09/25/2008  . Morbid obesity 08/28/2010  . Obstructive sleep apnea on CPAP 06/20/2010  . Hypertension 09/25/2008  . GERD 09/05/2009  . Diverticulosis 03/26/2010  . Microscopic hematuria 01/13/2010  . Cystocele 01/13/2010  . Uterine prolapse 11/19/2008  . Perimenopausal 01/13/2010  . Vertigo 10/07/2012  . Degenerative disk disease 10/07/2012  . Cervical polyp     possible history of cervical polyp   Past Surgical History  Procedure Laterality Date  . Cholecystectomy  2010  . Rotator cuff repair      left  . Tubal ligation  1992  . Hysteroscopy w/d&c N/A 01/02/2014    Procedure: DILATATION AND CURETTAGE /HYSTEROSCOPY;  Surgeon: Linda Hedges, DO;  Location: [facility];  Service: Gynecology;  Laterality: N/A;   Family History  Problem Relation Age of Onset   . Colon cancer Mother 29  . Hypertension Mother   . Stroke Mother 72  . Breast cancer Other     maternal great-aunt  . Cancer Other     maternal great aunt with breast cancer   History  Substance Use Topics  . Smoking status: Never Smoker   . Smokeless tobacco: Never Used  . Alcohol Use: No   OB History    Gravida Para Term Preterm AB TAB SAB Ectopic Multiple Living   3 3 3  0 0 0 0 0 0 3     Review of Systems As in history of present illness Allergies  Ace inhibitors and Promethazine hcl  Home Medications   Prior to Admission medications   Medication Sig Start Date End Date Taking? Authorizing Provider  acetaminophen (TYLENOL) 500 MG tablet Take 1 tablet (500 mg total) by mouth every 4 (four) hours as needed. 01/02/14   Cresenciano Genre, MD  amLODipine (NORVASC) 5 MG tablet Take 1 tablet (5 mg total) by mouth daily. 05/02/15 05/01/16  Cresenciano Genre, MD  benzonatate (TESSALON) 100 MG capsule Take 1 capsule (100 mg total) by mouth 3 (three) times daily as needed for cough. 05/12/15   Melony Overly, MD  BIOTIN PO Take 1 tablet by mouth daily.    Historical Provider, MD  Blood Pressure KIT 1 kit by Does not apply route daily. 08/23/14   Cresenciano Genre, MD  cycloSPORINE (RESTASIS) 0.05 % ophthalmic emulsion Place 1 drop into both eyes 2 (two) times daily.    Historical Provider,  MD  Dextromethorphan-Benzocaine (CEPACOL SORE THROAT & COUGH) 5-7.5 MG LOZG Use as needed for cough and sore throat 05/09/15   Corky Sox, MD  esomeprazole (NEXIUM) 40 MG capsule Take 1 capsule (40 mg total) by mouth daily before breakfast. 02/05/15   Otho Bellows, MD  fluticasone [facility]) 50 MCG/ACT nasal spray Place 1 spray into both nostrils daily. 05/02/15 05/01/16  Cresenciano Genre, MD  guaiFENesin-codeine 100-10 MG/5ML syrup Take 5 mLs by mouth every 4 (four) hours as needed for cough. 05/09/15   Corky Sox, MD  hydrochlorothiazide (HYDRODIURIL) 25 MG tablet Take 1 tablet (25 mg total) by mouth daily. 05/02/15    Cresenciano Genre, MD  ibuprofen (ADVIL,MOTRIN) 800 MG tablet Take 1 tablet (800 mg total) by mouth every 8 (eight) hours as needed. 01/02/14   Megan Morris, DO  levofloxacin (LEVAQUIN) 500 MG tablet Take 1 tablet (500 mg total) by mouth daily. 05/12/15   Melony Overly, MD  loratadine (CLARITIN) 10 MG tablet Take 1 tablet (10 mg total) by mouth daily. 05/02/15 05/01/16  Cresenciano Genre, MD  losartan (COZAAR) 100 MG tablet Take 1 tablet (100 mg total) by mouth daily. 05/02/15   Cresenciano Genre, MD  Magnesium 250 MG TABS Take 1 tablet by mouth daily.    Historical Provider, MD  meclizine (ANTIVERT) 25 MG tablet Take 1 tablet (25 mg total) by mouth 3 (three) times daily as needed. 05/02/15   Cresenciano Genre, MD  Multiple Vitamin (MULTIVITAMIN WITH MINERALS) TABS Take 1 tablet by mouth every other day.    Historical Provider, MD  Omega-3 Fatty Acids (FISH OIL) 1000 MG CAPS Take 1 capsule by mouth daily.    Historical Provider, MD  potassium chloride SA (K-DUR,KLOR-CON) 20 MEQ tablet Take 1 tablet (20 mEq total) by mouth daily. 02/05/15   Otho Bellows, MD  predniSONE (DELTASONE) 50 MG tablet Take 1 pill daily for 5 days. 05/12/15   Melony Overly, MD   BP 152/87 mmHg  Pulse 68  Temp(Src) 97.8 F (36.6 C) (Oral)  Resp 16  SpO2 99%  LMP 03/31/2013 Physical Exam  Constitutional: She is oriented to person, place, and time. She appears well-developed and well-nourished. No distress.  HENT:  Mouth/Throat: No oropharyngeal exudate.  Mild pharyngeal erythema. Nasal mucosa is quite erythematous. She has diffuse sinus tenderness.  Eyes: Conjunctivae are normal.  Neck: Neck supple.  Cardiovascular: Normal rate, regular rhythm and normal heart sounds.   No murmur heard. Pulmonary/Chest: She is in respiratory distress (speaking in short sentences). She has wheezes (Primarily in the left upper lung fields). She has no rales.  Lymphadenopathy:    She has no cervical adenopathy.  Neurological: She is alert and oriented to  person, place, and time.    ED Course  Procedures (including critical care time) Labs Review Labs Reviewed - No data to display  Imaging Review Dg Chest 2 View  05/12/2015   CLINICAL DATA:  Productive cough with pain  EXAM: CHEST  2 VIEW  COMPARISON:  09/12/2009  FINDINGS: Heart size is normal. Mediastinal shadows are normal. There is patchy infiltrate seen in the lower lobes on lateral view, favor to represent a left lower lobe infiltrate behind the heart on the frontal view. No dense consolidation or lobar collapse. No effusions. No bony abnormalities.  IMPRESSION: Suspicion of mild patchy infiltrate in the left lower lobe, best appreciated on the lateral view.   Electronically Signed   By: Nelson Chimes  M.D.   On: 05/12/2015 14:34     MDM   1. CAP (community acquired pneumonia)    DuoNeb 0.5-2.5 mg given.  She does not report any improvement after breathing treatment, but her wheezing is resolved.  X-ray concerning for developing pneumonia. We'll treat with prednisone and Levaquin. Tessalon for cough. Return precautions reviewed.    Melony Overly, MD 05/12/15 1501

## 2015-05-12 NOTE — Discharge Instructions (Signed)
Your x-ray is concerning for a developing pneumonia. Take Levaquin and prednisone as prescribed. Use Tessalon 3 times a day as needed for cough. If you develop fevers, worsening shortness of breath, or are just not getting better, please go to the emergency room.

## 2015-05-12 NOTE — ED Notes (Signed)
C/o cough and sob States cough is worst Cough is productive with yellow and red mucous  States she feels weaker, sob and fatigue

## 2015-05-22 ENCOUNTER — Encounter: Payer: Self-pay | Admitting: Internal Medicine

## 2015-05-22 ENCOUNTER — Ambulatory Visit (INDEPENDENT_AMBULATORY_CARE_PROVIDER_SITE_OTHER): Payer: Medicare Other | Admitting: Internal Medicine

## 2015-05-22 VITALS — BP 128/50 | HR 83 | Temp 98.2°F | Ht 66.0 in | Wt 330.1 lb

## 2015-05-22 DIAGNOSIS — R5383 Other fatigue: Secondary | ICD-10-CM | POA: Diagnosis not present

## 2015-05-22 DIAGNOSIS — I10 Essential (primary) hypertension: Secondary | ICD-10-CM | POA: Diagnosis not present

## 2015-05-22 DIAGNOSIS — J189 Pneumonia, unspecified organism: Secondary | ICD-10-CM | POA: Diagnosis not present

## 2015-05-22 DIAGNOSIS — G894 Chronic pain syndrome: Secondary | ICD-10-CM

## 2015-05-22 LAB — TSH: TSH: 1.857 u[IU]/mL (ref 0.350–4.500)

## 2015-05-22 MED ORDER — BENZONATATE 100 MG PO CAPS
100.0000 mg | ORAL_CAPSULE | Freq: Two times a day (BID) | ORAL | Status: DC | PRN
Start: 2015-05-22 — End: 2015-10-23

## 2015-05-22 MED ORDER — ACETAMINOPHEN 500 MG PO TABS: 500.0000 mg | ORAL_TABLET | ORAL | Status: DC | PRN

## 2015-05-22 NOTE — Assessment & Plan Note (Signed)
BP Readings from Last 3 Encounters:  05/22/15 128/50  05/12/15 152/87  05/09/15 135/69    Lab Results  Component Value Date   NA 139 02/05/2015   K 4.2 02/05/2015   CREATININE 0.63 02/05/2015    Assessment: Blood pressure control: controlled Progress toward BP goal:  at goal Comments: none  Plan: Medications:  continue current medications (Norvasc 5, HCTZ 25, Cozaar 100)  Other plans: f/u in 2-3 months

## 2015-05-22 NOTE — Patient Instructions (Addendum)
General Instructions: We will check blood work today kidneys, livers, blood counts and thyroid today  Please come to [facility] and get CXR last week of July 2016    Treatment Goals:  Goals (1 Years of Data) as of 05/22/15          As of Today 05/12/15 05/09/15 05/09/15 05/09/15     Blood Pressure   . Blood Pressure < 140/90  128/50 152/87 135/69 144/76 174/63     Result Component   . LDL CALC < 130            Progress Toward Treatment Goals:  Treatment Goal 05/22/2015  Blood pressure at goal  Prevent falls -    Self Care Goals & Plans:  Self Care Goal 05/22/2015  Manage my medications take my medicines as prescribed; bring my medications to every visit; refill my medications on time; follow the sick day instructions if I am sick  Monitor my health keep track of my blood pressure  Eat healthy foods drink diet soda or water instead of juice or soda; eat more vegetables; eat foods that are low in salt; eat baked foods instead of fried foods; eat fruit for snacks and desserts; eat smaller portions  Be physically active find an activity I enjoy  Meeting treatment goals maintain the current self-care plan    No flowsheet data found.   Care Management & Community Referrals:  Referral 05/22/2015  Referrals made for care management support none needed  Referrals made to community resources none        Pneumonia Pneumonia is an infection of the lungs.  CAUSES Pneumonia may be caused by bacteria or a virus. Usually, these infections are caused by breathing infectious particles into the lungs (respiratory tract). SIGNS AND SYMPTOMS   Cough.  Fever.  Chest pain.  Increased rate of breathing.  Wheezing.  Mucus production. DIAGNOSIS  If you have the common symptoms of pneumonia, your health care provider will typically confirm the diagnosis with a chest X-ray. The X-ray will show an abnormality in the lung (pulmonary infiltrate) if you have pneumonia. Other tests of your  blood, urine, or sputum may be done to find the specific cause of your pneumonia. Your health care provider may also do tests (blood gases or pulse oximetry) to see how well your lungs are working. TREATMENT  Some forms of pneumonia may be spread to other people when you cough or sneeze. You may be asked to wear a mask before and during your exam. Pneumonia that is caused by bacteria is treated with antibiotic medicine. Pneumonia that is caused by the influenza virus may be treated with an antiviral medicine. Most other viral infections must run their course. These infections will not respond to antibiotics.  HOME CARE INSTRUCTIONS   Cough suppressants may be used if you are losing too much rest. However, coughing protects you by clearing your lungs. You should avoid using cough suppressants if you can.  Your health care provider may have prescribed medicine if he or she thinks your pneumonia is caused by bacteria or influenza. Finish your medicine even if you start to feel better.  Your health care provider may also prescribe an expectorant. This loosens the mucus to be coughed up.  Take medicines only as directed by your health care provider.  Do not smoke. Smoking is a common cause of bronchitis and can contribute to pneumonia. If you are a smoker and continue to smoke, your cough may last several weeks  after your pneumonia has cleared.  A cold steam vaporizer or humidifier in your room or home may help loosen mucus.  Coughing is often worse at night. Sleeping in a semi-upright position in a recliner or using a couple pillows under your head will help with this.  Get rest as you feel it is needed. Your body will usually let you know when you need to rest. PREVENTION A pneumococcal shot (vaccine) is available to prevent a common bacterial cause of pneumonia. This is usually suggested for:  People over 38 years old.  Patients on chemotherapy.  People with chronic lung problems, such as  bronchitis or emphysema.  People with immune system problems. If you are over 65 or have a high risk condition, you may receive the pneumococcal vaccine if you have not received it before. In some countries, a routine influenza vaccine is also recommended. This vaccine can help prevent some cases of pneumonia.You may be offered the influenza vaccine as part of your care. If you smoke, it is time to quit. You may receive instructions on how to stop smoking. Your health care provider can provide medicines and counseling to help you quit. SEEK MEDICAL CARE IF: You have a fever. SEEK IMMEDIATE MEDICAL CARE IF:   Your illness becomes worse. This is especially true if you are elderly or weakened from any other disease.  You cannot control your cough with suppressants and are losing sleep.  You begin coughing up blood.  You develop pain which is getting worse or is uncontrolled with medicines.  Any of the symptoms which initially brought you in for treatment are getting worse rather than better.  You develop shortness of breath or chest pain. MAKE SURE YOU:   Understand these instructions.  Will watch your condition.  Will get help right away if you are not doing well or get worse. Document Released: 11/16/2005 Document Revised: 04/02/2014 Document Reviewed: 02/05/2011 [facility] Patient Information 2015 [place], [place]. This information is not intended to replace advice given to you by your health care provider. Make sure you discuss any questions you have with your health care provider.

## 2015-05-22 NOTE — Assessment & Plan Note (Addendum)
Unclear etiology getting adequate sleep and using cpap. Could be 2/2 recent illness with pna Will w/u with  CMET, CBC, TSH Encouraged pt to exercise as well

## 2015-05-22 NOTE — Assessment & Plan Note (Signed)
dx'ed on CXR 6/12 with LLL infiltrate  Doing better  Will repeat CXR week of 7/24 to f/u resolution Continue Tessalon perles for cough

## 2015-05-22 NOTE — Progress Notes (Signed)
   Subjective:    Patient ID: Amy Jarvis, female    DOB: [birth year], 54 y.o.   MRN: [id number]  HPI Comments: 54 y.o PMH uterine prolapse>urinary incontinence (sees Dr. Linda Hedges again [facility]), telogen effluviium, endometrial polyp s/p removal, onychomycosis, chronic leg edema, HTN, HLD, GERD, fibroid uterus, diverticulosis, DDD, allergic rhinitis/seasonal allergies   She presents for f/u  1. CAP recently dx'ed LLL on 6/12 tx'ed with 5 days of Prednisone and Levaquin, Tessalon perles. She started feeling sick on 6/4 with dry cough, aching, chills, fever, decreased appetite, subjective fever, weakness, chest pain with coughing.  She denies h/o recent contacts but she states she recently was in the hospital visiting someone w/in late May, early June.  She was seen in clinic 6/9 and given cough syrup with codeine with did not help her sx's so she went to Urgent Care 6/12 with CXR with LLL pna CAP -Overall she feels better after antibiotics but still feels fatigued.  She is still coughing but less and with yellow phelgm but Lavella Lemons is helping  2. HTN-BP 128/50 On Norvasc 5, HCTZ 25 mg qd, Losartan 100 mg qd.   3. She c/o fatigue after recent pna. She denies lack of sleep. She has h/o OSA and is using cpap       Review of Systems  Constitutional: Positive for fatigue.  Respiratory: Positive for cough. Negative for shortness of breath.   Cardiovascular: Positive for leg swelling. Negative for chest pain.       Objective:   Physical Exam  Constitutional: She is oriented to person, place, and time. Vital signs are normal. She appears well-developed and well-nourished. She is cooperative. No distress.  HENT:  Head: Normocephalic and atraumatic.  Eyes: Conjunctivae are normal. Right eye exhibits no discharge. Left eye exhibits no discharge. No scleral icterus.  Cardiovascular: Normal rate, regular rhythm, S1 normal, S2 normal and normal heart sounds.   No murmur  heard. 2+ lower ext edema b/l   Pulmonary/Chest: Effort normal and breath sounds normal. No respiratory distress. She has no wheezes.  Neurological: She is alert and oriented to person, place, and time. Gait normal.  Skin: Skin is warm, dry and intact. No rash noted. She is not diaphoretic.  Psychiatric: She has a normal mood and affect. Her speech is normal and behavior is normal. Judgment and thought content normal. Cognition and memory are normal.  Nursing note and vitals reviewed.         Assessment & Plan:

## 2015-05-23 LAB — CBC WITH DIFFERENTIAL/PLATELET
BASOS PCT: 0 % (ref 0–1)
Basophils Absolute: 0 10*3/uL (ref 0.0–0.1)
Eosinophils Absolute: 0.2 10*3/uL (ref 0.0–0.7)
Eosinophils Relative: 3 % (ref 0–5)
HCT: 35 % — ABNORMAL LOW (ref 36.0–46.0)
HEMOGLOBIN: 11.2 g/dL — AB (ref 12.0–15.0)
LYMPHS ABS: 2.5 10*3/uL (ref 0.7–4.0)
Lymphocytes Relative: 32 % (ref 12–46)
MCH: 25.8 pg — AB (ref 26.0–34.0)
MCHC: 32 g/dL (ref 30.0–36.0)
MCV: 80.6 fL (ref 78.0–100.0)
MONO ABS: 0.6 10*3/uL (ref 0.1–1.0)
MONOS PCT: 8 % (ref 3–12)
MPV: 9 fL (ref 8.6–12.4)
NEUTROS ABS: 4.4 10*3/uL (ref 1.7–7.7)
NEUTROS PCT: 57 % (ref 43–77)
Platelets: 362 10*3/uL (ref 150–400)
RBC: 4.34 MIL/uL (ref 3.87–5.11)
RDW: 16 % — AB (ref 11.5–15.5)
WBC: 7.7 10*3/uL (ref 4.0–10.5)

## 2015-05-23 LAB — COMPLETE METABOLIC PANEL WITH GFR
ALBUMIN: 3.7 g/dL (ref 3.5–5.2)
ALK PHOS: 95 U/L (ref 39–117)
ALT: 23 U/L (ref 0–35)
AST: 17 U/L (ref 0–37)
BUN: 15 mg/dL (ref 6–23)
CO2: 29 mEq/L (ref 19–32)
Calcium: 9.2 mg/dL (ref 8.4–10.5)
Chloride: 101 mEq/L (ref 96–112)
Creat: 0.82 mg/dL (ref 0.50–1.10)
GFR, Est African American: >89 mL/min
GFR, Est Non African American: 81 mL/min
GLUCOSE: 99 mg/dL (ref 70–99)
Potassium: 3.2 mEq/L — ABNORMAL LOW (ref 3.5–5.3)
Sodium: 142 mEq/L (ref 135–145)
TOTAL PROTEIN: 6.4 g/dL (ref 6.0–8.3)
Total Bilirubin: 0.4 mg/dL (ref 0.2–1.2)

## 2015-05-23 NOTE — Addendum Note (Signed)
Addended by: Cresenciano Genre on: 05/23/2015 04:20 PM   Modules accepted: Orders

## 2015-05-24 LAB — ANEMIA PANEL
%SAT: 21 % (ref 20–55)
ABS RETIC: 38.8 10*3/uL (ref 19.0–186.0)
Ferritin: 201 ng/mL (ref 10–291)
Folate: 11.3 ng/mL
IRON: 67 ug/dL (ref 42–145)
RBC.: 4.31 MIL/uL (ref 3.87–5.11)
RETIC CT PCT: 0.9 % (ref 0.4–2.3)
TIBC: 319 ug/dL (ref 250–470)
UIBC: 252 ug/dL (ref 125–400)
VITAMIN B 12: 484 pg/mL (ref 211–911)

## 2015-05-24 NOTE — Progress Notes (Signed)
INTERNAL MEDICINE TEACHING ATTENDING ADDENDUM - Naidelyn Parrella, MD: I reviewed and discussed at the time of visit with the resident Dr. McLean, the patient's medical history, physical examination, diagnosis and results of pertinent tests and treatment and I agree with the patient's care as documented.  

## 2015-08-07 ENCOUNTER — Other Ambulatory Visit: Payer: Self-pay | Admitting: Internal Medicine

## 2015-08-07 DIAGNOSIS — K219 Gastro-esophageal reflux disease without esophagitis: Secondary | ICD-10-CM

## 2015-08-07 MED ORDER — ESOMEPRAZOLE MAGNESIUM 40 MG PO CPDR: 40.0000 mg | DELAYED_RELEASE_CAPSULE | Freq: Every day | ORAL | Status: DC

## 2015-08-07 NOTE — Telephone Encounter (Signed)
Pt called requesting nexium to be filled @ [facility] on [address].

## 2015-09-04 ENCOUNTER — Other Ambulatory Visit: Payer: Self-pay | Admitting: *Deleted

## 2015-09-04 DIAGNOSIS — I10 Essential (primary) hypertension: Secondary | ICD-10-CM

## 2015-09-04 MED ORDER — LOSARTAN POTASSIUM 100 MG PO TABS: 100.0000 mg | ORAL_TABLET | Freq: Every day | ORAL | Status: DC

## 2015-09-04 MED ORDER — HYDROCHLOROTHIAZIDE 25 MG PO TABS: 25.0000 mg | ORAL_TABLET | Freq: Every day | ORAL | Status: DC

## 2015-09-04 NOTE — Telephone Encounter (Signed)
Please refill for 90 day supply

## 2015-09-04 NOTE — Telephone Encounter (Signed)
Yes that is fine. Thank. Dr. Hulen Luster

## 2015-09-04 NOTE — Telephone Encounter (Signed)
Rx were written for # 30 with 2 refills.  I changed to # 90 with no refills at pt's request.  Is this okay with you?

## 2015-09-23 ENCOUNTER — Other Ambulatory Visit: Payer: Self-pay | Admitting: Internal Medicine

## 2015-09-23 ENCOUNTER — Encounter: Payer: Self-pay | Admitting: Internal Medicine

## 2015-09-23 DIAGNOSIS — J309 Allergic rhinitis, unspecified: Secondary | ICD-10-CM

## 2015-09-23 DIAGNOSIS — J3089 Other allergic rhinitis: Secondary | ICD-10-CM | POA: Insufficient documentation

## 2015-09-23 DIAGNOSIS — I10 Essential (primary) hypertension: Secondary | ICD-10-CM

## 2015-09-23 DIAGNOSIS — J302 Other seasonal allergic rhinitis: Secondary | ICD-10-CM

## 2015-09-23 HISTORY — DX: Allergic rhinitis, unspecified: J30.9

## 2015-09-23 MED ORDER — AMLODIPINE BESYLATE 5 MG PO TABS: 5.0000 mg | ORAL_TABLET | Freq: Every day | ORAL | Status: DC

## 2015-09-23 MED ORDER — POTASSIUM CHLORIDE CRYS ER 20 MEQ PO TBCR: 20.0000 meq | EXTENDED_RELEASE_TABLET | Freq: Every day | ORAL | Status: DC

## 2015-09-23 MED ORDER — LORATADINE 10 MG PO TABS: 10.0000 mg | ORAL_TABLET | Freq: Every day | ORAL | Status: DC

## 2015-09-23 MED ORDER — FLUTICASONE PROPIONATE 50 MCG/ACT NA SUSP: 1.0000 | Freq: Every day | NASAL | Status: DC

## 2015-09-23 NOTE — Telephone Encounter (Signed)
Pt requesting amlodipine, potassium, claritin and flonase to be filled @ [facility] on [address]. Pt want the meds for 90 days supply.

## 2015-10-10 DIAGNOSIS — N8189 Other female genital prolapse: Secondary | ICD-10-CM | POA: Diagnosis not present

## 2015-10-10 DIAGNOSIS — N3945 Continuous leakage: Secondary | ICD-10-CM | POA: Diagnosis not present

## 2015-10-13 ENCOUNTER — Encounter (HOSPITAL_COMMUNITY): Payer: Self-pay | Admitting: Emergency Medicine

## 2015-10-13 ENCOUNTER — Emergency Department (INDEPENDENT_AMBULATORY_CARE_PROVIDER_SITE_OTHER)
Admission: EM | Admit: 2015-10-13 | Discharge: 2015-10-13 | Disposition: A | Discharge status: Home / Self Care | Payer: Medicare Other | Source: Home / Self Care

## 2015-10-13 DIAGNOSIS — L03012 Cellulitis of left finger: Secondary | ICD-10-CM

## 2015-10-13 MED ORDER — SULFAMETHOXAZOLE-TRIMETHOPRIM 800-160 MG PO TABS: 1.0000 | ORAL_TABLET | Freq: Two times a day (BID) | ORAL | Status: AC

## 2015-10-13 NOTE — ED Provider Notes (Signed)
CSN: [id number]     Arrival date & time 10/13/15  1322 History   None    Chief Complaint  Patient presents with  . Hand Pain   (Consider location/radiation/quality/duration/timing/severity/associated sxs/prior Treatment) HPI History obtained from patient:   LOCATION: Right index finger SEVERITY: 6 DURATION: 5 days CONTEXT: Cuticle work at home QUALITY: MODIFYING FACTORS: Iodine without any resolution of symptoms ASSOCIATED SYMPTOMS: Swelling pus TIMING: Constant OCCUPATION:  Past Medical History  Diagnosis Date  . Hyperlipidemia LDL goal < 130 09/25/2008  . Morbid obesity 08/28/2010  . Obstructive sleep apnea on CPAP 06/20/2010  . Hypertension 09/25/2008  . GERD 09/05/2009  . Diverticulosis 03/26/2010  . Microscopic hematuria 01/13/2010  . Cystocele 01/13/2010  . Uterine prolapse 11/19/2008  . Perimenopausal 01/13/2010  . Vertigo 10/07/2012  . Degenerative disk disease 10/07/2012  . Cervical polyp     possible history of cervical polyp  . Allergic rhinitis 09/23/2015   Past Surgical History  Procedure Laterality Date  . Cholecystectomy  2010  . Rotator cuff repair      left  . Tubal ligation  1992  . Hysteroscopy w/d&c N/A 01/02/2014    Procedure: DILATATION AND CURETTAGE /HYSTEROSCOPY;  Surgeon: Megan Morris, DO;  Location: [facility];  Service: Gynecology;  Laterality: N/A;   Family History  Problem Relation Age of Onset  . Colon cancer Mother 66  . Hypertension Mother   . Stroke Mother 61  . Breast cancer Other     maternal great-aunt  . Cancer Other     maternal great aunt with breast cancer   Social History  Substance Use Topics  . Smoking status: Never Smoker   . Smokeless tobacco: Never Used  . Alcohol Use: No   OB History    Gravida Para Term Preterm AB TAB SAB Ectopic Multiple Living   3 3 3 0 0 0 0 0 0 3     Review of Systems ROS +'ve right index finger swelling  Denies: HEADACHE, NAUSEA, ABDOMINAL PAIN, CHEST PAIN, CONGESTION, DYSURIA, SHORTNESS OF  BREATH  Allergies  Ace inhibitors and Promethazine hcl  Home Medications   Prior to Admission medications   Medication Sig Start Date End Date Taking? Authorizing Provider  acetaminophen (TYLENOL) 500 MG tablet Take 1 tablet (500 mg total) by mouth every 4 (four) hours as needed. 05/22/15  Yes Tracy N McLean-Scocozza, MD  amLODipine (NORVASC) 5 MG tablet Take 1 tablet (5 mg total) by mouth daily. 09/23/15 09/22/16 Yes Lawrence Klima, MD  esomeprazole (NEXIUM) 40 MG capsule Take 1 capsule (40 mg total) by mouth daily before breakfast. 08/07/15  Yes Diana M Truong, MD  hydrochlorothiazide (HYDRODIURIL) 25 MG tablet Take 1 tablet (25 mg total) by mouth daily. 09/04/15  Yes Diana M Truong, MD  benzonatate (TESSALON) 100 MG capsule Take 1 capsule (100 mg total) by mouth 2 (two) times daily as needed for cough. 05/22/15   Tracy N McLean-Scocozza, MD  BIOTIN PO Take 1 tablet by mouth daily.    Historical Provider, MD  Blood Pressure KIT 1 kit by Does not apply route daily. 08/23/14   Tracy N McLean-Scocozza, MD  cycloSPORINE (RESTASIS) 0.05 % ophthalmic emulsion Place 1 drop into both eyes 2 (two) times daily.    Historical Provider, MD  Dextromethorphan-Benzocaine (CEPACOL SORE THROAT & COUGH) 5-7.5 MG LOZG Use as needed for cough and sore throat Patient not taking: Reported on 05/22/2015 05/09/15   Eden W Jones, MD  fluticasone (FLONASE) 50 MCG/ACT nasal spray Place 1   spray into both nostrils daily. 09/23/15 09/22/16  Oval Linsey, MD  guaiFENesin-codeine 100-10 MG/5ML syrup Take 5 mLs by mouth every 4 (four) hours as needed for cough. Patient not taking: Reported on 05/22/2015 05/09/15   Corky Sox, MD  ibuprofen (ADVIL,MOTRIN) 800 MG tablet Take 1 tablet (800 mg total) by mouth every 8 (eight) hours as needed. 01/02/14   Megan Morris, DO  loratadine (CLARITIN) 10 MG tablet Take 1 tablet (10 mg total) by mouth daily. 09/23/15 09/22/16  Oval Linsey, MD  losartan (COZAAR) 100 MG tablet Take 1 tablet (100  mg total) by mouth daily. 09/04/15   Norman Herrlich, MD  Magnesium 250 MG TABS Take 1 tablet by mouth daily.    Historical Provider, MD  meclizine (ANTIVERT) 25 MG tablet Take 1 tablet (25 mg total) by mouth 3 (three) times daily as needed. 05/02/15   Nino Glow McLean-Scocozza, MD  Multiple Vitamin (MULTIVITAMIN WITH MINERALS) TABS Take 1 tablet by mouth every other day.    Historical Provider, MD  Omega-3 Fatty Acids (FISH OIL) 1000 MG CAPS Take 1 capsule by mouth daily.    Historical Provider, MD  potassium chloride SA (K-DUR,KLOR-CON) 20 MEQ tablet Take 1 tablet (20 mEq total) by mouth daily. 09/23/15   Oval Linsey, MD  sulfamethoxazole-trimethoprim (BACTRIM DS,SEPTRA DS) 800-160 MG tablet Take 1 tablet by mouth 2 (two) times daily. 10/13/15 10/20/15  Konrad Felix, PA   Meds Ordered and Administered this Visit  Medications - No data to display  BP 151/78 mmHg  Pulse 77  Temp(Src) 97 F (36.1 C) (Oral)  Resp 18  SpO2 100%  LMP 03/31/2013 No data found.   Physical Exam  Constitutional: She is oriented to person, place, and time. She appears well-developed and well-nourished. No distress.  Eyes: Conjunctivae are normal.  Pulmonary/Chest: Effort normal.  Musculoskeletal:       Hands: Neurological: She is alert and oriented to person, place, and time.  Skin: Skin is warm and dry.  Nursing note and vitals reviewed.   ED Course  .Marland KitchenIncision and Drainage Date/Time: 10/13/2015 3:19 PM Performed by: Konrad Felix Authorized by: Linde Gillis C Consent: Verbal consent obtained. Risks and benefits: risks, benefits and alternatives were discussed Consent given by: patient Patient identity confirmed: verbally with patient and arm band Type: abscess Body area: upper extremity Location details: left index finger Local anesthetic: topical anesthetic and co-phenylcaine spray Patient sedated: no Scalpel size: 11 Incision type: single straight Complexity: simple Drainage:  purulent Drainage amount: scant Wound treatment: wound left open Patient tolerance: Patient tolerated the procedure well with no immediate complications   (including critical care time)  Labs Review Labs Reviewed - No data to display  Imaging Review No results found.   Visual Acuity Review  Right Eye Distance:   Left Eye Distance:   Bilateral Distance:    Right Eye Near:   Left Eye Near:    Bilateral Near:         MDM   1. Paronychia of finger of left hand    No indication for suggestions at this time vital signs are stable patient is nontoxic in appearance. Incision and drainage performed under sterile conditions without difficulty or complications. Bactrim DS twice a day for 5 days will be prescribed. Instructions of care including soak in warm soapy water or Epsom salts are provided. Patient is advised to return if there are new or worsening symptoms. Discharged home in stable condition.    Pilar Grammes  [place], [place] 10/13/15 [phone]

## 2015-10-13 NOTE — Discharge Instructions (Signed)
Paronychia  °Paronychia is an infection of the skin. It happens near a fingernail or toenail. It may cause pain and swelling around the nail. Usually, it is not serious and it clears up with treatment. °HOME CARE °· Soak the fingers or toes in warm water as told by your doctor. You may be told to do this for 20 minutes, 2-3 times a day. °· Keep the area dry when you are not soaking it. °· Take medicines only as told by your doctor. °· If you were given an antibiotic medicine, finish all of it even if you start to feel better. °· Keep the affected area clean. °· Do not try to drain a fluid-filled bump yourself. °· Wear rubber gloves when putting your hands in water. °· Wear gloves if your hands might touch cleaners or chemicals. °· Follow your doctor's instructions about: °¨ Wound care. °¨ Bandage (dressing) changes and removal. °GET HELP IF: °· Your symptoms get worse or do not improve. °· You have a fever or chills. °· You have redness spreading from the affected area. °· You have more fluid, blood, or pus coming from the affected area. °· Your finger or knuckle is swollen or is hard to move. °  °This information is not intended to replace advice given to you by your health care provider. Make sure you discuss any questions you have with your health care provider. °  °Document Released: 11/04/2009 Document Revised: 04/02/2015 Document Reviewed: 10/24/2014 °Elsevier Interactive Patient Education ©2016 [employer] ° °

## 2015-10-13 NOTE — ED Notes (Signed)
Pt c/o infection around left index finger onset 11/9 Sx include swelling around nailbed and tenderness A&O x4... No acute distress.

## 2015-10-16 DIAGNOSIS — R3915 Urgency of urination: Secondary | ICD-10-CM | POA: Diagnosis not present

## 2015-10-16 DIAGNOSIS — N819 Female genital prolapse, unspecified: Secondary | ICD-10-CM | POA: Diagnosis not present

## 2015-10-22 DIAGNOSIS — N3945 Continuous leakage: Secondary | ICD-10-CM | POA: Diagnosis not present

## 2015-10-23 ENCOUNTER — Encounter: Payer: Self-pay | Admitting: Internal Medicine

## 2015-10-23 ENCOUNTER — Ambulatory Visit (INDEPENDENT_AMBULATORY_CARE_PROVIDER_SITE_OTHER): Payer: Medicare Other | Admitting: Internal Medicine

## 2015-10-23 VITALS — BP 163/84 | HR 69 | Temp 97.8°F | Ht 69.0 in | Wt 316.6 lb

## 2015-10-23 DIAGNOSIS — E785 Hyperlipidemia, unspecified: Secondary | ICD-10-CM | POA: Diagnosis not present

## 2015-10-23 DIAGNOSIS — R5382 Chronic fatigue, unspecified: Secondary | ICD-10-CM

## 2015-10-23 DIAGNOSIS — Z23 Encounter for immunization: Secondary | ICD-10-CM | POA: Diagnosis not present

## 2015-10-23 DIAGNOSIS — G894 Chronic pain syndrome: Secondary | ICD-10-CM

## 2015-10-23 DIAGNOSIS — I10 Essential (primary) hypertension: Secondary | ICD-10-CM

## 2015-10-23 DIAGNOSIS — Z0000 Encounter for general adult medical examination without abnormal findings: Secondary | ICD-10-CM | POA: Diagnosis not present

## 2015-10-23 MED ORDER — HYDROCORTISONE 1 % EX CREA: TOPICAL_CREAM | CUTANEOUS | Status: DC

## 2015-10-23 MED ORDER — AMLODIPINE BESYLATE 10 MG PO TABS: 10.0000 mg | ORAL_TABLET | Freq: Every day | ORAL | Status: DC

## 2015-10-23 MED ORDER — DICLOFENAC SODIUM 1 % TD GEL: 4.0000 g | Freq: Four times a day (QID) | TRANSDERMAL | Status: DC

## 2015-10-23 NOTE — Patient Instructions (Signed)
Start taking Norvasc 10mg  daily for your blood pressure.  You can apply voltaren gel to you back as needed for pain.

## 2015-10-23 NOTE — Progress Notes (Signed)
Subjective:   Patient ID: Amy Jarvis female   DOB: 1961-11-30 54 y.o.   MRN: [id number]  HPI: [name] is a 54 y.o. w/ PMHx as outlined below who presents for HTN follow up.   Please see problem list for status of the pt's chronic medical problems.  Past Medical History  Diagnosis Date  . Hyperlipidemia LDL goal < 130 09/25/2008  . Morbid obesity 08/28/2010  . Obstructive sleep apnea on CPAP 06/20/2010  . Hypertension 09/25/2008  . GERD 09/05/2009  . Diverticulosis 03/26/2010  . Microscopic hematuria 01/13/2010  . Cystocele 01/13/2010  . Uterine prolapse 11/19/2008  . Perimenopausal 01/13/2010  . Vertigo 10/07/2012  . Degenerative disk disease 10/07/2012  . Cervical polyp     possible history of cervical polyp  . Allergic rhinitis 09/23/2015   Current Outpatient Prescriptions  Medication Sig Dispense Refill  . acetaminophen (TYLENOL) 500 MG tablet Take 1 tablet (500 mg total) by mouth every 4 (four) hours as needed. 180 tablet 1  . amLODipine (NORVASC) 5 MG tablet Take 1 tablet (5 mg total) by mouth daily. 90 tablet 3  . benzonatate (TESSALON) 100 MG capsule Take 1 capsule (100 mg total) by mouth 2 (two) times daily as needed for cough. 60 capsule 0  . BIOTIN PO Take 1 tablet by mouth daily.    . Blood Pressure KIT 1 kit by Does not apply route daily. 1 each 0  . cycloSPORINE (RESTASIS) 0.05 % ophthalmic emulsion Place 1 drop into both eyes 2 (two) times daily.    Marland Kitchen Dextromethorphan-Benzocaine (CEPACOL SORE THROAT & COUGH) 5-7.5 MG LOZG Use as needed for cough and sore throat (Patient not taking: Reported on 05/22/2015) 18 each 1  . esomeprazole (NEXIUM) 40 MG capsule Take 1 capsule (40 mg total) by mouth daily before breakfast. 90 capsule 3  . fluticasone (FLONASE) 50 MCG/ACT nasal spray Place 1 spray into both nostrils daily. 16 g 3  . guaiFENesin-codeine 100-10 MG/5ML syrup Take 5 mLs by mouth every 4 (four) hours as needed for cough. (Patient not taking: Reported  on 05/22/2015) 120 mL 0  . hydrochlorothiazide (HYDRODIURIL) 25 MG tablet Take 1 tablet (25 mg total) by mouth daily. 30 tablet 2  . ibuprofen (ADVIL,MOTRIN) 800 MG tablet Take 1 tablet (800 mg total) by mouth every 8 (eight) hours as needed. 30 tablet 0  . loratadine (CLARITIN) 10 MG tablet Take 1 tablet (10 mg total) by mouth daily. 90 tablet 3  . losartan (COZAAR) 100 MG tablet Take 1 tablet (100 mg total) by mouth daily. 30 tablet 2  . Magnesium 250 MG TABS Take 1 tablet by mouth daily.    . meclizine (ANTIVERT) 25 MG tablet Take 1 tablet (25 mg total) by mouth 3 (three) times daily as needed. 30 tablet 2  . Multiple Vitamin (MULTIVITAMIN WITH MINERALS) TABS Take 1 tablet by mouth every other day.    . Omega-3 Fatty Acids (FISH OIL) 1000 MG CAPS Take 1 capsule by mouth daily.    . potassium chloride SA (K-DUR,KLOR-CON) 20 MEQ tablet Take 1 tablet (20 mEq total) by mouth daily. 90 tablet 3   No current facility-administered medications for this visit.   Family History  Problem Relation Age of Onset  . Colon cancer Mother 51  . Hypertension Mother   . Stroke Mother 39  . Breast cancer Other     maternal great-aunt  . Cancer Other     maternal great aunt with breast cancer  Social History   Social History  . Marital Status: Divorced    Spouse Name: N/A  . Number of Children: 3  . Years of Education: N/A   Occupational History  .     Social History Main Topics  . Smoking status: Never Smoker   . Smokeless tobacco: Never Used  . Alcohol Use: No  . Drug Use: No  . Sexual Activity: Not Currently   Other Topics Concern  . Not on file   Social History Narrative   Review of Systems: Review of Systems  Constitutional: Positive for malaise/fatigue (chronic). Negative for fever and chills.  Eyes: Negative for double vision.  Respiratory: Negative for cough and shortness of breath.   Cardiovascular: Positive for leg swelling (states improved since stopping HCTZ).    Gastrointestinal: Negative for abdominal pain.  Genitourinary: Positive for frequency (due to uterine prolaspe). Negative for dysuria and hematuria.  Musculoskeletal: Positive for back pain (chronic, controlled w/ ibuprofen and tylenol).  Neurological: Positive for dizziness (2-3 times a week, stops when staying still or w/ meclizine).   Objective:  Physical Exam: Filed Vitals:   10/23/15 1400  BP: 163/84  Pulse: 69  Temp: 97.8 F (36.6 C)  TempSrc: Oral  Height: _0  (1.753 m)  Weight: 316 lb 9.6 oz (143.609 kg)  SpO2: 100%   Physical Exam  Constitutional: She appears well-developed and well-nourished. No distress.  HENT:  Head: Normocephalic.  Cardiovascular: Normal rate, regular rhythm and normal heart sounds.   No murmur heard. Pulmonary/Chest: Effort normal and breath sounds normal. No respiratory distress. She has no wheezes. She has no rales.  Abdominal: Soft. Bowel sounds are normal. There is no tenderness.  Neurological: She is alert.  Skin: Skin is warm and dry.  Rt index finger cuticle wound healing well and non tender   Assessment & Plan:   Please see problem based assessment and plan.

## 2015-10-23 NOTE — Addendum Note (Signed)
Addended by: Norman Herrlich on: 10/23/2015 04:34 PM   Modules accepted: Miquel Dunn

## 2015-10-23 NOTE — Assessment & Plan Note (Signed)
Chronic back pain that is controlled w/ tylenol and ibuprofen. Pain is unchanged. Rx for Voltaren gel given.

## 2015-10-23 NOTE — Assessment & Plan Note (Signed)
Fatigue is chronic and last worked up last year. TSH was WNL. Hgb was low. Will re check CBC today.

## 2015-10-23 NOTE — Assessment & Plan Note (Addendum)
Pneumovax today. Pt states she will get the flu shot at her pharmacy. She received a letter from GI to schedule a colonoscopy and does not need a referral. Lipid panel today, if LDL elevated can try low dose statin, pravastatin, or zetia. She has a TAH scheduled on Jan 26 for uterine prolapse.

## 2015-10-23 NOTE — Addendum Note (Signed)
Addended by: Marcelino Duster on: 10/23/2015 04:22 PM   Modules accepted: Orders

## 2015-10-23 NOTE — Assessment & Plan Note (Signed)
BP Readings from Last 3 Encounters:  10/23/15 163/84  10/13/15 151/78  05/22/15 128/50    Lab Results  Component Value Date   NA 142 05/22/2015   K 3.2* 05/22/2015   CREATININE 0.82 05/22/2015    Assessment: Blood pressure control:  not controlled Progress toward BP goal:   deteriorated  Comments: pt taking norvasc 5mg  and cozaar 100mg . She stopped taking HCTZ 25mg  due to urinary incontinence from uterine prolapse.   Plan: Medications:  D/c HCTZ. Continue cozaar and increased norvasc to 10mg .  Educational resources provided:   Self management tools provided:   Other plans: BMET today.

## 2015-10-24 LAB — CBC
HEMOGLOBIN: 11.9 g/dL (ref 11.1–15.9)
Hematocrit: 35.3 % (ref 34.0–46.6)
MCH: 26.5 pg — ABNORMAL LOW (ref 26.6–33.0)
MCHC: 33.7 g/dL (ref 31.5–35.7)
MCV: 79 fL (ref 79–97)
NRBC: 0 % (ref 0–0)
PLATELETS: 302 10*3/uL (ref 150–379)
RBC: 4.49 x10E6/uL (ref 3.77–5.28)
RDW: 16.1 % — AB (ref 12.3–15.4)
WBC: 3.5 10*3/uL (ref 3.4–10.8)

## 2015-10-24 LAB — LIPID PANEL
CHOL/HDL RATIO: 4.3 ratio (ref 0.0–4.4)
Cholesterol, Total: 211 mg/dL — ABNORMAL HIGH (ref 100–199)
HDL: 49 mg/dL (ref >39)
LDL CALC: 143 mg/dL — AB (ref 0–99)
Triglycerides: 93 mg/dL (ref 0–149)
VLDL CHOLESTEROL CAL: 19 mg/dL (ref 5–40)

## 2015-10-24 LAB — BMP8+ANION GAP
Anion Gap: 17 mmol/L (ref 10.0–18.0)
BUN/Creatinine Ratio: 19 (ref 9–23)
BUN: 14 mg/dL (ref 6–24)
CALCIUM: 9.6 mg/dL (ref 8.7–10.2)
CO2: 24 mmol/L (ref 18–29)
Chloride: 101 mmol/L (ref 97–106)
Creatinine, Ser: 0.72 mg/dL (ref 0.57–1.00)
GFR, EST AFRICAN AMERICAN: 110 mL/min/{1.73_m2} (ref >59)
GFR, EST NON AFRICAN AMERICAN: 95 mL/min/{1.73_m2} (ref >59)
Glucose: 87 mg/dL (ref 65–99)
POTASSIUM: 4.2 mmol/L (ref 3.5–5.2)
Sodium: 142 mmol/L (ref 136–144)

## 2015-10-24 LAB — MAGNESIUM: Magnesium: 2.1 mg/dL (ref 1.6–2.3)

## 2015-10-30 NOTE — Progress Notes (Signed)
Internal Medicine Clinic Attending  Case discussed with Dr. Truong soon after the resident saw the patient.  We reviewed the resident's history and exam and pertinent patient test results.  I agree with the assessment, diagnosis, and plan of care documented in the resident's note. 

## 2015-11-08 ENCOUNTER — Other Ambulatory Visit: Payer: Self-pay | Admitting: Internal Medicine

## 2015-11-08 MED ORDER — HYDROCORTISONE 1 % EX CREA: TOPICAL_CREAM | CUTANEOUS | Status: DC

## 2015-11-08 NOTE — Telephone Encounter (Signed)
Pt requesting hydrocortisone cream to be filled @ [facility].

## 2015-11-11 ENCOUNTER — Encounter: Payer: Self-pay | Admitting: Internal Medicine

## 2015-11-11 ENCOUNTER — Ambulatory Visit (INDEPENDENT_AMBULATORY_CARE_PROVIDER_SITE_OTHER): Payer: Medicare Other | Admitting: Internal Medicine

## 2015-11-11 VITALS — BP 145/86 | HR 93 | Temp 98.1°F | Ht 69.0 in | Wt 319.0 lb

## 2015-11-11 DIAGNOSIS — G5603 Carpal tunnel syndrome, bilateral upper limbs: Secondary | ICD-10-CM | POA: Diagnosis not present

## 2015-11-11 DIAGNOSIS — K5900 Constipation, unspecified: Secondary | ICD-10-CM | POA: Diagnosis not present

## 2015-11-11 DIAGNOSIS — G4733 Obstructive sleep apnea (adult) (pediatric): Secondary | ICD-10-CM

## 2015-11-11 DIAGNOSIS — Z9989 Dependence on other enabling machines and devices: Secondary | ICD-10-CM

## 2015-11-11 DIAGNOSIS — G5600 Carpal tunnel syndrome, unspecified upper limb: Secondary | ICD-10-CM | POA: Insufficient documentation

## 2015-11-11 DIAGNOSIS — L309 Dermatitis, unspecified: Secondary | ICD-10-CM | POA: Insufficient documentation

## 2015-11-11 MED ORDER — POLYETHYLENE GLYCOL 3350 17 GM/SCOOP PO POWD: 17.0000 g | Freq: Every day | ORAL | Status: DC

## 2015-11-11 MED ORDER — HYDROCORTISONE 2.5 % EX LOTN: TOPICAL_LOTION | CUTANEOUS | Status: DC

## 2015-11-11 NOTE — Progress Notes (Signed)
Subjective:   Patient ID: Amy Jarvis female   DOB: [birth year] 54 y.o.   MRN: [id number]  HPI: [name] is a 54 y.o. woman with past medical history as described below presenting for bilateral hand numbness. She states she has decreased sensation with occasional pain in the fingers of both hands. She is not sure when symptoms started but they were more noticeable to her at her last office visit in November. This pain is worse at night. She has not had any recent change in activity. She does not have any numbness or weakness of her forearms proximal to the wrists. She does not have any neck or shoulder pain.  See problem based assessment and plan below for additional details.  Past Medical History  Diagnosis Date  . Hyperlipidemia LDL goal < 130 09/25/2008  . Morbid obesity 08/28/2010  . Obstructive sleep apnea on CPAP 06/20/2010  . Hypertension 09/25/2008  . GERD 09/05/2009  . Diverticulosis 03/26/2010  . Microscopic hematuria 01/13/2010  . Cystocele 01/13/2010  . Uterine prolapse 11/19/2008  . Perimenopausal 01/13/2010  . Vertigo 10/07/2012  . Degenerative disk disease 10/07/2012  . Cervical polyp     possible history of cervical polyp  . Allergic rhinitis 09/23/2015   Current Outpatient Prescriptions  Medication Sig Dispense Refill  . acetaminophen (TYLENOL) 500 MG tablet Take 1 tablet (500 mg total) by mouth every 4 (four) hours as needed. 180 tablet 1  . amLODipine (NORVASC) 10 MG tablet Take 1 tablet (10 mg total) by mouth daily. 90 tablet 3  . BIOTIN PO Take 1 tablet by mouth daily.    . Blood Pressure KIT 1 kit by Does not apply route daily. 1 each 0  . cycloSPORINE (RESTASIS) 0.05 % ophthalmic emulsion Place 1 drop into both eyes 2 (two) times daily.    . diclofenac sodium (VOLTAREN) 1 % GEL Apply 4 g topically 4 (four) times daily. 1 Tube 3  . esomeprazole (NEXIUM) 40 MG capsule Take 1 capsule (40 mg total) by mouth daily before breakfast. 90 capsule 3  .  fluticasone (FLONASE) 50 MCG/ACT nasal spray Place 1 spray into both nostrils daily. 16 g 3  . hydrocortisone cream 1 % Apply to affected area 2 times daily as needed 30 g 1  . ibuprofen (ADVIL,MOTRIN) 800 MG tablet Take 1 tablet (800 mg total) by mouth every 8 (eight) hours as needed. 30 tablet 0  . loratadine (CLARITIN) 10 MG tablet Take 1 tablet (10 mg total) by mouth daily. 90 tablet 3  . losartan (COZAAR) 100 MG tablet Take 1 tablet (100 mg total) by mouth daily. 30 tablet 2  . Magnesium 250 MG TABS Take 1 tablet by mouth daily.    . meclizine (ANTIVERT) 25 MG tablet Take 1 tablet (25 mg total) by mouth 3 (three) times daily as needed. 30 tablet 2  . Multiple Vitamin (MULTIVITAMIN WITH MINERALS) TABS Take 1 tablet by mouth every other day.    . Omega-3 Fatty Acids (FISH OIL) 1000 MG CAPS Take 1 capsule by mouth daily.    . potassium chloride SA (K-DUR,KLOR-CON) 20 MEQ tablet Take 1 tablet (20 mEq total) by mouth daily. 90 tablet 3   No current facility-administered medications for this visit.   Family History  Problem Relation Age of Onset  . Colon cancer Mother 35  . Hypertension Mother   . Stroke Mother 81  . Breast cancer Other     maternal great-aunt  . Cancer Other  maternal great aunt with breast cancer   Social History   Social History  . Marital Status: Divorced    Spouse Name: N/A  . Number of Children: 3  . Years of Education: N/A   Occupational History  .     Social History Main Topics  . Smoking status: Never Smoker   . Smokeless tobacco: Never Used  . Alcohol Use: No  . Drug Use: No  . Sexual Activity: Not Currently   Other Topics Concern  . None   Social History Narrative   Review of Systems: Review of Systems  Constitutional: Negative for fever and chills.  Respiratory: Negative for cough and shortness of breath.   Cardiovascular: Negative for leg swelling.  Musculoskeletal: Positive for back pain and joint pain. Negative for myalgias, falls  and neck pain.  Skin: Negative for itching.  Neurological: Positive for tingling and sensory change. Negative for tremors, weakness and headaches.    Objective:  Physical Exam: Filed Vitals:   11/11/15 1527  BP: 145/86  Pulse: 93  Temp: 98.1 F (36.7 C)  TempSrc: Oral  Height: _0  (1.753 m)  Weight: 319 lb (144.697 kg)  SpO2: 100%   GENERAL- morbidly obese woman, co-operative, NAD HEENT- Atraumatic, PERRL, EOMI, oral mucosa appears moist, good and intact dentition, no carotid bruit, no cervical LN enlargement. CV- RRR, no murmurs, rubs, or gallops NEURO- decreased sensation on first 3 and lateral aspect of fourth digit on both hands, deceased sharp/soft and 2 object discrimination, with 5 out of 5 grip strength, negative Tinel and Phalen signs EXTREMITIES- wrists elbows and shoulders all nontender on palpation and range of motion, pulses 2+ symmetric  Assessment & Plan:

## 2015-11-11 NOTE — Patient Instructions (Signed)
Your hand numbness is likely related to impaired conduction of the nerve which can occur at the level of your wrist, elbow, or elsewhere. The best study to learn where this is and treat appropriately is a Nerve Conduction Velocity (NCV) study. We have sent a referral to [facility] to schedule this test. You will be called about this appointment to arrange the time and what you can expect. We will delay starting a wrist splint until getting this study since it will give better information about whether this treatment would be beneficial.

## 2015-11-12 NOTE — Assessment & Plan Note (Addendum)
She is currently having a flare of her eczema that is no longer responding to 1% hydrocortisone cream daily. Rash is minimal if at all visible but she does describe very pruritic regions on extensor surfaces. On review she has previously been treated with 2.5% hydrocortisone cream with improvement in her symptoms. We will prescribe this with instruction for twice-daily application and see if she gets relief.

## 2015-11-12 NOTE — Assessment & Plan Note (Addendum)
Assessment: Insidious onset numbness and subjective weakness of bilateral hands in distribution of the median nerve. The pain is worst at night/when waking up. Amy Jarvis has no trauma, surgery, or other known likely underlying cause. Amy Jarvis has no history of neuropathy and on recent metabolic panel had no abnormalities. Amy Jarvis has never had significant neck pain, neck injury, or neck surgery. Amy Jarvis does not do any activity requiring repetitive motion or extensive typing.  Her symptoms are currently severe enough to make daily tasks more difficult but are not debilitating. Her complaint is numbness more than pain. Her only knwon risk factor for carpal tunnel syndrome at this time is her morbid obesity. Amy Jarvis'll need referral for nerve conduction study for definitive diagnosis to decide treatment plan.  Plan: Referral to [facility] for nerve conduction velocity study

## 2015-11-12 NOTE — Assessment & Plan Note (Signed)
She has been constipated with only 1 bowel movement per approximately 3 days that require some straining over the past few weeks. She has not been noting any frank blood or discoloration of the stool. She thinks MiraLAX has helped her constipation in the past and wants a prescription for similar laxative. Prescribed polyethylene glycol 17 g by mouth as needed for constipation.

## 2015-11-12 NOTE — Progress Notes (Signed)
Internal Medicine Clinic Attending  I saw and evaluated the patient.  I personally confirmed the key portions of the history and exam documented by Dr. Rice and I reviewed pertinent patient test results.  The assessment, diagnosis, and plan were formulated together and I agree with the documentation in the resident's note.  

## 2015-11-18 ENCOUNTER — Other Ambulatory Visit: Payer: Self-pay | Admitting: Internal Medicine

## 2015-11-18 DIAGNOSIS — G4733 Obstructive sleep apnea (adult) (pediatric): Secondary | ICD-10-CM

## 2015-11-18 DIAGNOSIS — Z9989 Dependence on other enabling machines and devices: Principal | ICD-10-CM

## 2015-12-13 ENCOUNTER — Other Ambulatory Visit (HOSPITAL_COMMUNITY): Payer: Medicare Other

## 2015-12-16 DIAGNOSIS — Z01818 Encounter for other preprocedural examination: Secondary | ICD-10-CM | POA: Diagnosis not present

## 2015-12-18 ENCOUNTER — Telehealth: Payer: Self-pay | Admitting: Internal Medicine

## 2015-12-18 ENCOUNTER — Other Ambulatory Visit: Payer: Self-pay | Admitting: Internal Medicine

## 2015-12-18 NOTE — Telephone Encounter (Signed)
Pt requesting losartan to be filled @ [facility] on [address].

## 2015-12-18 NOTE — Telephone Encounter (Signed)
Refill request already sent to md

## 2015-12-19 NOTE — Patient Instructions (Addendum)
Your procedure is scheduled on:  Thursday, Jan. 26, 2017  Enter through the Main Entrance of [facility] at:  6:00 A.M.  Pick up the phone at the desk and dial [phone].  Call this number if you have problems the morning of surgery: [phone].  Remember: Do NOT eat food or drink after:  Midnight Wednesday Take these medicines the morning of surgery with a SIP OF WATER:  Amlodipine, Nexium, Losartan, Potassium   *Stop taking Fish oil at this time  Do NOT wear jewelry (body piercing), metal hair clips/bobby pins, make-up, or nail polish. Do NOT wear lotions, powders, or perfumes.  You may wear deoderant. Do NOT shave for 48 hours prior to surgery. Do NOT bring valuables to the hospital. Contacts, dentures, or bridgework may not be worn into surgery.  Leave suitcase in car.  After surgery it may be brought to your room.  For patients admitted to the hospital, checkout time is 11:00 AM the day of discharge.

## 2015-12-20 ENCOUNTER — Encounter (HOSPITAL_COMMUNITY)
Admission: RE | Admit: 2015-12-20 | Discharge: 2015-12-20 | Disposition: A | Discharge status: Home / Self Care | Payer: Medicare Other | Source: Ambulatory Visit | Attending: Obstetrics & Gynecology | Admitting: Obstetrics & Gynecology

## 2015-12-22 NOTE — H&P (Signed)
Amy Jarvis is an 55 y.o. female with symptomatic pelvic organ prolapse. The patient suffers from urinary urgency, frequency, and stress incontinence but UDS indicated sling would not benefit her.  Uterus is 140cc with 3 small fibroids; the largest measures 4 cm and is anterior.  Stage II to III POP with cystocele and rectocele.  Other than BTL, the patient has had no other pelvic surgery.    Pertinent Gynecological History: Menses: post-menopausal Bleeding: none  Contraception: post menopausal status DES exposure: unknown Blood transfusions: none Sexually transmitted diseases: no past history Previous GYN Procedures: BTL, D&C  Last mammogram: normal Date: 2015 Last pap: normal Date: 2014 OB History: G3, P3   Menstrual History: Menarche age: n/a Patient's last menstrual period was 03/31/2013.    Past Medical History  Diagnosis Date  . Hyperlipidemia LDL goal < 130 09/25/2008  . Morbid obesity 08/28/2010  . Obstructive sleep apnea on CPAP 06/20/2010  . Hypertension 09/25/2008  . GERD 09/05/2009  . Diverticulosis 03/26/2010  . Microscopic hematuria 01/13/2010  . Cystocele 01/13/2010  . Uterine prolapse 11/19/2008  . Perimenopausal 01/13/2010  . Vertigo 10/07/2012  . Degenerative disk disease 10/07/2012  . Cervical polyp     possible history of cervical polyp  . Allergic rhinitis 09/23/2015    Past Surgical History  Procedure Laterality Date  . Cholecystectomy  2010  . Rotator cuff repair      left  . Tubal ligation  1992  . Hysteroscopy w/d&c N/A 01/02/2014    Procedure: DILATATION AND CURETTAGE /HYSTEROSCOPY;  Surgeon: Linda Hedges, DO;  Location: [facility];  Service: Gynecology;  Laterality: N/A;    Family History  Problem Relation Age of Onset  . Colon cancer Mother 73  . Hypertension Mother   . Stroke Mother 60  . Breast cancer Other     maternal great-aunt  . Cancer Other     maternal great aunt with breast cancer    Social History:  reports that she has never  smoked. She has never used smokeless tobacco. She reports that she does not drink alcohol or use illicit drugs.  Allergies:  Allergies  Allergen Reactions  . Ace Inhibitors Anaphylaxis, Itching and Swelling  . Promethazine Hcl Itching, Swelling, Rash and Other (See Comments)    Pt states that this medication causes her lips to swell.      No prescriptions prior to admission    ROS  Last menstrual period 03/31/2013. Physical Exam  Constitutional: She is oriented to person, place, and time. She appears well-developed and well-nourished.  Cardiovascular: Normal rate and regular rhythm.   Respiratory: Effort normal and breath sounds normal. No respiratory distress.  GI: Soft. There is no rebound and no guarding.  Neurological: She is alert and oriented to person, place, and time.  Skin: Skin is warm and dry.  Psychiatric: She has a normal mood and affect. Her behavior is normal.    No results found for this or any previous visit (from the past 24 hour(s)).  No results found.  Assessment/Plan: 55yo G3P3 with symptomatic POP -LAVH, BSO, possible anterior/posterior repair and SSLF-The patient is counseled re: risk of bleeding, infection, scarring and damage to surrounding structures.  The patient understands the possibility of needing to convert to abdominal incision to complete safely.  She also understands the risk of recurrence of POP.  All questions were answered and the patient wishes to proceed.   Jaycob Mcclenton, [place] 12/22/2015, 6:43 PM

## 2015-12-24 ENCOUNTER — Other Ambulatory Visit: Payer: Self-pay

## 2015-12-24 ENCOUNTER — Encounter (HOSPITAL_COMMUNITY)
Admission: RE | Admit: 2015-12-24 | Discharge: 2015-12-24 | Disposition: A | Discharge status: Home / Self Care | Payer: Medicare Other | Source: Ambulatory Visit | Attending: Obstetrics & Gynecology | Admitting: Obstetrics & Gynecology

## 2015-12-24 ENCOUNTER — Encounter (INDEPENDENT_AMBULATORY_CARE_PROVIDER_SITE_OTHER): Payer: Self-pay

## 2015-12-24 ENCOUNTER — Encounter (HOSPITAL_COMMUNITY): Payer: Self-pay

## 2015-12-24 DIAGNOSIS — Z9851 Tubal ligation status: Secondary | ICD-10-CM | POA: Diagnosis not present

## 2015-12-24 DIAGNOSIS — Z23 Encounter for immunization: Secondary | ICD-10-CM | POA: Diagnosis not present

## 2015-12-24 DIAGNOSIS — Z6841 Body Mass Index (BMI) 40.0 and over, adult: Secondary | ICD-10-CM | POA: Diagnosis not present

## 2015-12-24 DIAGNOSIS — N8189 Other female genital prolapse: Secondary | ICD-10-CM | POA: Diagnosis not present

## 2015-12-24 DIAGNOSIS — Z888 Allergy status to other drugs, medicaments and biological substances status: Secondary | ICD-10-CM | POA: Diagnosis not present

## 2015-12-24 DIAGNOSIS — Z9049 Acquired absence of other specified parts of digestive tract: Secondary | ICD-10-CM | POA: Diagnosis not present

## 2015-12-24 DIAGNOSIS — N840 Polyp of corpus uteri: Secondary | ICD-10-CM | POA: Diagnosis not present

## 2015-12-24 DIAGNOSIS — Z79899 Other long term (current) drug therapy: Secondary | ICD-10-CM | POA: Diagnosis not present

## 2015-12-24 DIAGNOSIS — I10 Essential (primary) hypertension: Secondary | ICD-10-CM | POA: Diagnosis not present

## 2015-12-24 DIAGNOSIS — G4733 Obstructive sleep apnea (adult) (pediatric): Secondary | ICD-10-CM | POA: Diagnosis not present

## 2015-12-24 DIAGNOSIS — D259 Leiomyoma of uterus, unspecified: Secondary | ICD-10-CM | POA: Diagnosis not present

## 2015-12-24 DIAGNOSIS — M1990 Unspecified osteoarthritis, unspecified site: Secondary | ICD-10-CM | POA: Diagnosis not present

## 2015-12-24 DIAGNOSIS — N888 Other specified noninflammatory disorders of cervix uteri: Secondary | ICD-10-CM | POA: Diagnosis not present

## 2015-12-24 DIAGNOSIS — E785 Hyperlipidemia, unspecified: Secondary | ICD-10-CM | POA: Diagnosis not present

## 2015-12-24 DIAGNOSIS — K219 Gastro-esophageal reflux disease without esophagitis: Secondary | ICD-10-CM | POA: Diagnosis not present

## 2015-12-24 HISTORY — DX: Other intervertebral disc degeneration, lumbar region without mention of lumbar back pain or lower extremity pain: M51.369

## 2015-12-24 HISTORY — DX: Other intervertebral disc degeneration, lumbar region: M51.36

## 2015-12-24 HISTORY — DX: Pneumonia, unspecified organism: J18.9

## 2015-12-24 HISTORY — DX: Spinal stenosis, lumbar region without neurogenic claudication: M48.061

## 2015-12-24 HISTORY — DX: Carpal tunnel syndrome, bilateral upper limbs: G56.03

## 2015-12-24 LAB — COMPREHENSIVE METABOLIC PANEL
ALT: 20 U/L (ref 14–54)
ANION GAP: 11 (ref 5–15)
AST: 16 U/L (ref 15–41)
Albumin: 4.5 g/dL (ref 3.5–5.0)
Alkaline Phosphatase: 135 U/L — ABNORMAL HIGH (ref 38–126)
BUN: 14 mg/dL (ref 6–20)
CHLORIDE: 101 mmol/L (ref 101–111)
CO2: 28 mmol/L (ref 22–32)
Calcium: 10.1 mg/dL (ref 8.9–10.3)
Creatinine, Ser: 0.72 mg/dL (ref 0.44–1.00)
Glucose, Bld: 96 mg/dL (ref 65–99)
POTASSIUM: 4 mmol/L (ref 3.5–5.1)
Sodium: 140 mmol/L (ref 135–145)
TOTAL PROTEIN: 8.2 g/dL — AB (ref 6.5–8.1)
Total Bilirubin: 1 mg/dL (ref 0.3–1.2)

## 2015-12-24 LAB — CBC
HCT: 37.2 % (ref 36.0–46.0)
Hemoglobin: 11.9 g/dL — ABNORMAL LOW (ref 12.0–15.0)
MCH: 26.1 pg (ref 26.0–34.0)
MCHC: 32 g/dL (ref 30.0–36.0)
MCV: 81.6 fL (ref 78.0–100.0)
PLATELETS: 357 10*3/uL (ref 150–400)
RBC: 4.56 MIL/uL (ref 3.87–5.11)
RDW: 15.8 % — AB (ref 11.5–15.5)
WBC: 5.9 10*3/uL (ref 4.0–10.5)

## 2015-12-24 NOTE — Patient Instructions (Addendum)
Your procedure is scheduled on:  Thursday, Jan. 26, 2017  Enter through the Main Entrance of [facility] at:  6:00 A.M.  Pick up the phone at the desk and dial [phone].  Call this number if you have problems the morning of surgery: [phone].  Remember: Do NOT eat food or drink after:  Midnight Tomorrow Take these medicines the morning of surgery with a SIP OF WATER:  None  *Stop taking fish oil at this time  Do NOT wear jewelry (body piercing), metal hair clips/bobby pins, make-up, or nail polish. Do NOT wear lotions, powders, or perfumes.  You may wear deoderant. Do NOT shave for 48 hours prior to surgery. Do NOT bring valuables to the hospital. Contacts, dentures, or bridgework may not be worn into surgery.  Leave suitcase in car.  After surgery it may be brought to your room.  For patients admitted to the hospital, checkout time is 11:00 AM the day of discharge.

## 2015-12-25 MED ORDER — DEXTROSE 5 % IV SOLN
3.0000 g | INTRAVENOUS | Status: AC
  Administered 2015-12-26: 3 g via INTRAVENOUS
  Filled 2015-12-25: qty 3000

## 2015-12-26 ENCOUNTER — Ambulatory Visit (HOSPITAL_COMMUNITY): Payer: Medicare Other | Admitting: Anesthesiology

## 2015-12-26 ENCOUNTER — Encounter (HOSPITAL_COMMUNITY)
Admission: RE | Disposition: A | Discharge status: Home / Self Care | Payer: Self-pay | Source: Ambulatory Visit | Attending: Obstetrics & Gynecology

## 2015-12-26 ENCOUNTER — Observation Stay (HOSPITAL_COMMUNITY)
Admission: RE | Admit: 2015-12-26 | Discharge: 2015-12-27 | Disposition: A | Discharge status: Home / Self Care | Payer: Medicare Other | Source: Ambulatory Visit | Attending: Obstetrics & Gynecology | Admitting: Obstetrics & Gynecology

## 2015-12-26 ENCOUNTER — Encounter (HOSPITAL_COMMUNITY): Payer: Self-pay | Admitting: *Deleted

## 2015-12-26 DIAGNOSIS — Z6841 Body Mass Index (BMI) 40.0 and over, adult: Secondary | ICD-10-CM | POA: Insufficient documentation

## 2015-12-26 DIAGNOSIS — D259 Leiomyoma of uterus, unspecified: Secondary | ICD-10-CM | POA: Insufficient documentation

## 2015-12-26 DIAGNOSIS — N393 Stress incontinence (female) (male): Secondary | ICD-10-CM | POA: Diagnosis not present

## 2015-12-26 DIAGNOSIS — Z90721 Acquired absence of ovaries, unilateral: Secondary | ICD-10-CM

## 2015-12-26 DIAGNOSIS — K219 Gastro-esophageal reflux disease without esophagitis: Secondary | ICD-10-CM | POA: Insufficient documentation

## 2015-12-26 DIAGNOSIS — Z9851 Tubal ligation status: Secondary | ICD-10-CM | POA: Insufficient documentation

## 2015-12-26 DIAGNOSIS — N888 Other specified noninflammatory disorders of cervix uteri: Secondary | ICD-10-CM | POA: Diagnosis not present

## 2015-12-26 DIAGNOSIS — G4733 Obstructive sleep apnea (adult) (pediatric): Secondary | ICD-10-CM | POA: Insufficient documentation

## 2015-12-26 DIAGNOSIS — Z79899 Other long term (current) drug therapy: Secondary | ICD-10-CM | POA: Insufficient documentation

## 2015-12-26 DIAGNOSIS — M1990 Unspecified osteoarthritis, unspecified site: Secondary | ICD-10-CM | POA: Insufficient documentation

## 2015-12-26 DIAGNOSIS — Z23 Encounter for immunization: Secondary | ICD-10-CM | POA: Diagnosis not present

## 2015-12-26 DIAGNOSIS — D251 Intramural leiomyoma of uterus: Secondary | ICD-10-CM | POA: Diagnosis not present

## 2015-12-26 DIAGNOSIS — E785 Hyperlipidemia, unspecified: Secondary | ICD-10-CM | POA: Insufficient documentation

## 2015-12-26 DIAGNOSIS — N840 Polyp of corpus uteri: Secondary | ICD-10-CM | POA: Insufficient documentation

## 2015-12-26 DIAGNOSIS — Z9049 Acquired absence of other specified parts of digestive tract: Secondary | ICD-10-CM | POA: Insufficient documentation

## 2015-12-26 DIAGNOSIS — I10 Essential (primary) hypertension: Secondary | ICD-10-CM | POA: Insufficient documentation

## 2015-12-26 DIAGNOSIS — N8189 Other female genital prolapse: Principal | ICD-10-CM | POA: Insufficient documentation

## 2015-12-26 DIAGNOSIS — Z888 Allergy status to other drugs, medicaments and biological substances status: Secondary | ICD-10-CM | POA: Insufficient documentation

## 2015-12-26 DIAGNOSIS — Z90710 Acquired absence of both cervix and uterus: Secondary | ICD-10-CM | POA: Diagnosis present

## 2015-12-26 HISTORY — PX: LAPAROSCOPIC VAGINAL HYSTERECTOMY WITH SALPINGO OOPHORECTOMY: SHX6681

## 2015-12-26 HISTORY — PX: RECTOCELE REPAIR: SHX761

## 2015-12-26 HISTORY — PX: CYSTOSCOPY: SHX5120

## 2015-12-26 SURGERY — HYSTERECTOMY, VAGINAL, LAPAROSCOPY-ASSISTED, WITH SALPINGO-OOPHORECTOMY
Anesthesia: General | Site: Vagina

## 2015-12-26 MED ORDER — SCOPOLAMINE 1 MG/3DAYS TD PT72
1.0000 | MEDICATED_PATCH | Freq: Once | TRANSDERMAL | Status: DC
  Administered 2015-12-26: 1.5 mg via TRANSDERMAL

## 2015-12-26 MED ORDER — DEXAMETHASONE SODIUM PHOSPHATE 4 MG/ML IJ SOLN
INTRAMUSCULAR | Status: AC
  Filled 2015-12-26: qty 1

## 2015-12-26 MED ORDER — LACTATED RINGERS IV SOLN
INTRAVENOUS | Status: DC
  Administered 2015-12-26: 06:00:00 via INTRAVENOUS

## 2015-12-26 MED ORDER — PROPOFOL 10 MG/ML IV BOLUS
INTRAVENOUS | Status: DC | PRN
Start: 2015-12-26 — End: 2015-12-26
  Administered 2015-12-26: 200 mg via INTRAVENOUS

## 2015-12-26 MED ORDER — PHENYLEPHRINE 40 MCG/ML (10ML) SYRINGE FOR IV PUSH (FOR BLOOD PRESSURE SUPPORT)
PREFILLED_SYRINGE | INTRAVENOUS | Status: AC
  Filled 2015-12-26: qty 20

## 2015-12-26 MED ORDER — KETOROLAC TROMETHAMINE 30 MG/ML IJ SOLN
30.0000 mg | Freq: Four times a day (QID) | INTRAMUSCULAR | Status: DC
  Administered 2015-12-26 – 2015-12-27 (×3): 30 mg via INTRAVENOUS
  Filled 2015-12-26 (×3): qty 1

## 2015-12-26 MED ORDER — BISACODYL 10 MG RE SUPP: 10.0000 mg | Freq: Every day | RECTAL | Status: DC | PRN

## 2015-12-26 MED ORDER — HYDROMORPHONE HCL 1 MG/ML IJ SOLN
INTRAMUSCULAR | Status: AC
  Filled 2015-12-26: qty 1

## 2015-12-26 MED ORDER — OXYCODONE HCL 5 MG PO TABS: 5.0000 mg | ORAL_TABLET | Freq: Once | ORAL | Status: DC | PRN

## 2015-12-26 MED ORDER — GLYCOPYRROLATE 0.2 MG/ML IJ SOLN
INTRAMUSCULAR | Status: AC
  Filled 2015-12-26: qty 2

## 2015-12-26 MED ORDER — GLYCOPYRROLATE 0.2 MG/ML IJ SOLN
INTRAMUSCULAR | Status: AC
  Filled 2015-12-26: qty 1

## 2015-12-26 MED ORDER — HYDROMORPHONE HCL 1 MG/ML IJ SOLN
INTRAMUSCULAR | Status: AC
  Administered 2015-12-26: 0.5 mg via INTRAVENOUS
  Filled 2015-12-26: qty 1

## 2015-12-26 MED ORDER — KETOROLAC TROMETHAMINE 30 MG/ML IJ SOLN: 30.0000 mg | Freq: Four times a day (QID) | INTRAMUSCULAR | Status: DC

## 2015-12-26 MED ORDER — LIDOCAINE HCL (CARDIAC) 20 MG/ML IV SOLN
INTRAVENOUS | Status: DC | PRN
  Administered 2015-12-26: 80 mg via INTRAVENOUS

## 2015-12-26 MED ORDER — ROCURONIUM BROMIDE 100 MG/10ML IV SOLN
INTRAVENOUS | Status: AC
  Filled 2015-12-26: qty 1

## 2015-12-26 MED ORDER — HYDROMORPHONE HCL 1 MG/ML IJ SOLN
0.2000 mg | INTRAMUSCULAR | Status: DC | PRN
  Administered 2015-12-26: 0.5 mg via INTRAVENOUS
  Filled 2015-12-26: qty 1

## 2015-12-26 MED ORDER — OXYCODONE HCL 5 MG/5ML PO SOLN: 5.0000 mg | Freq: Once | ORAL | Status: DC | PRN

## 2015-12-26 MED ORDER — BUPIVACAINE HCL (PF) 0.25 % IJ SOLN
INTRAMUSCULAR | Status: DC | PRN
  Administered 2015-12-26: 6 mL

## 2015-12-26 MED ORDER — ONDANSETRON HCL 4 MG PO TABS: 4.0000 mg | ORAL_TABLET | Freq: Four times a day (QID) | ORAL | Status: DC | PRN

## 2015-12-26 MED ORDER — INFLUENZA VAC SPLIT QUAD 0.5 ML IM SUSY
0.5000 mL | PREFILLED_SYRINGE | INTRAMUSCULAR | Status: AC
  Administered 2015-12-27: 0.5 mL via INTRAMUSCULAR
  Filled 2015-12-26: qty 0.5

## 2015-12-26 MED ORDER — NEOSTIGMINE METHYLSULFATE 10 MG/10ML IV SOLN
INTRAVENOUS | Status: DC | PRN
  Administered 2015-12-26: 5 mg via INTRAVENOUS

## 2015-12-26 MED ORDER — KETOROLAC TROMETHAMINE 30 MG/ML IJ SOLN
30.0000 mg | Freq: Once | INTRAMUSCULAR | Status: AC
  Administered 2015-12-26: 30 mg via INTRAVENOUS

## 2015-12-26 MED ORDER — ROCURONIUM BROMIDE 100 MG/10ML IV SOLN
INTRAVENOUS | Status: DC | PRN
  Administered 2015-12-26: 20 mg via INTRAVENOUS
  Administered 2015-12-26: 50 mg via INTRAVENOUS
  Administered 2015-12-26: 5 mg via INTRAVENOUS
  Administered 2015-12-26: 10 mg via INTRAVENOUS

## 2015-12-26 MED ORDER — ESTRADIOL 0.1 MG/GM VA CREA
TOPICAL_CREAM | VAGINAL | Status: DC | PRN
  Administered 2015-12-26: 1 via VAGINAL

## 2015-12-26 MED ORDER — ALUM & MAG HYDROXIDE-SIMETH 200-200-20 MG/5ML PO SUSP: 30.0000 mL | ORAL | Status: DC | PRN

## 2015-12-26 MED ORDER — ONDANSETRON HCL 4 MG/2ML IJ SOLN
INTRAMUSCULAR | Status: DC | PRN
  Administered 2015-12-26: 4 mg via INTRAVENOUS

## 2015-12-26 MED ORDER — PROPOFOL 10 MG/ML IV BOLUS
INTRAVENOUS | Status: AC
  Filled 2015-12-26: qty 20

## 2015-12-26 MED ORDER — LIDOCAINE-EPINEPHRINE 1 %-1:100000 IJ SOLN
INTRAMUSCULAR | Status: AC
  Filled 2015-12-26: qty 1

## 2015-12-26 MED ORDER — MENTHOL 3 MG MT LOZG
1.0000 | LOZENGE | OROMUCOSAL | Status: DC | PRN
Start: 2015-12-26 — End: 2015-12-27

## 2015-12-26 MED ORDER — NEOSTIGMINE METHYLSULFATE 10 MG/10ML IV SOLN
INTRAVENOUS | Status: AC
  Filled 2015-12-26: qty 1

## 2015-12-26 MED ORDER — MIDAZOLAM HCL 2 MG/2ML IJ SOLN
INTRAMUSCULAR | Status: DC | PRN
  Administered 2015-12-26 (×2): 1 mg via INTRAVENOUS

## 2015-12-26 MED ORDER — ACETAMINOPHEN 160 MG/5ML PO SOLN: 325.0000 mg | ORAL | Status: DC | PRN

## 2015-12-26 MED ORDER — ACETAMINOPHEN 325 MG PO TABS: 325.0000 mg | ORAL_TABLET | ORAL | Status: DC | PRN

## 2015-12-26 MED ORDER — BUPIVACAINE HCL (PF) 0.25 % IJ SOLN
INTRAMUSCULAR | Status: AC
  Filled 2015-12-26: qty 30

## 2015-12-26 MED ORDER — HYDROMORPHONE HCL 1 MG/ML IJ SOLN
INTRAMUSCULAR | Status: DC | PRN
  Administered 2015-12-26 (×2): .5 mg via INTRAVENOUS

## 2015-12-26 MED ORDER — SCOPOLAMINE 1 MG/3DAYS TD PT72
MEDICATED_PATCH | TRANSDERMAL | Status: AC
  Administered 2015-12-26: 1.5 mg via TRANSDERMAL
  Filled 2015-12-26: qty 1

## 2015-12-26 MED ORDER — FUROSEMIDE 10 MG/ML IJ SOLN
INTRAMUSCULAR | Status: AC
  Filled 2015-12-26: qty 2

## 2015-12-26 MED ORDER — PHENYLEPHRINE 40 MCG/ML (10ML) SYRINGE FOR IV PUSH (FOR BLOOD PRESSURE SUPPORT)
PREFILLED_SYRINGE | INTRAVENOUS | Status: AC
  Filled 2015-12-26: qty 10

## 2015-12-26 MED ORDER — LIDOCAINE HCL (CARDIAC) 20 MG/ML IV SOLN
INTRAVENOUS | Status: AC
  Filled 2015-12-26: qty 5

## 2015-12-26 MED ORDER — FENTANYL CITRATE (PF) 100 MCG/2ML IJ SOLN
INTRAMUSCULAR | Status: DC | PRN
  Administered 2015-12-26: 100 ug via INTRAVENOUS
  Administered 2015-12-26 (×3): 50 ug via INTRAVENOUS

## 2015-12-26 MED ORDER — POTASSIUM CHLORIDE CRYS ER 20 MEQ PO TBCR
20.0000 meq | EXTENDED_RELEASE_TABLET | Freq: Every day | ORAL | Status: DC
  Administered 2015-12-26: 20 meq via ORAL
  Filled 2015-12-26 (×2): qty 1

## 2015-12-26 MED ORDER — LACTATED RINGERS IV SOLN
INTRAVENOUS | Status: DC
  Administered 2015-12-26 (×2): via INTRAVENOUS

## 2015-12-26 MED ORDER — LACTATED RINGERS IV SOLN
INTRAVENOUS | Status: DC
  Administered 2015-12-26 (×3): via INTRAVENOUS

## 2015-12-26 MED ORDER — ONDANSETRON HCL 4 MG/2ML IJ SOLN: 4.0000 mg | Freq: Four times a day (QID) | INTRAMUSCULAR | Status: DC | PRN

## 2015-12-26 MED ORDER — FUROSEMIDE 10 MG/ML IJ SOLN
INTRAMUSCULAR | Status: DC | PRN
  Administered 2015-12-26: 10 mg via INTRAMUSCULAR

## 2015-12-26 MED ORDER — STERILE WATER FOR IRRIGATION IR SOLN
Status: DC | PRN
  Administered 2015-12-26: 1000 mL via INTRAVESICAL

## 2015-12-26 MED ORDER — SODIUM CHLORIDE 0.9 % IJ SOLN
INTRAMUSCULAR | Status: AC
  Filled 2015-12-26: qty 10

## 2015-12-26 MED ORDER — METHYLENE BLUE 1 % INJ SOLN
INTRAMUSCULAR | Status: AC
  Filled 2015-12-26: qty 1

## 2015-12-26 MED ORDER — FENTANYL CITRATE (PF) 250 MCG/5ML IJ SOLN
INTRAMUSCULAR | Status: AC
  Filled 2015-12-26: qty 5

## 2015-12-26 MED ORDER — OXYCODONE-ACETAMINOPHEN 5-325 MG PO TABS
1.0000 | ORAL_TABLET | ORAL | Status: DC | PRN
  Administered 2015-12-27: 1 via ORAL
  Filled 2015-12-26: qty 1

## 2015-12-26 MED ORDER — METHYLENE BLUE 1 % INJ SOLN
INTRAMUSCULAR | Status: DC | PRN
  Administered 2015-12-26: 10 mg via INTRAVENOUS

## 2015-12-26 MED ORDER — ONDANSETRON HCL 4 MG/2ML IJ SOLN
INTRAMUSCULAR | Status: AC
  Filled 2015-12-26: qty 2

## 2015-12-26 MED ORDER — PHENYLEPHRINE HCL 10 MG/ML IJ SOLN
INTRAMUSCULAR | Status: DC | PRN
  Administered 2015-12-26 (×3): 80 ug via INTRAVENOUS
  Administered 2015-12-26 (×3): 40 ug via INTRAVENOUS
  Administered 2015-12-26: 80 ug via INTRAVENOUS

## 2015-12-26 MED ORDER — KETOROLAC TROMETHAMINE 30 MG/ML IJ SOLN
INTRAMUSCULAR | Status: AC
  Filled 2015-12-26: qty 1

## 2015-12-26 MED ORDER — DOCUSATE SODIUM 100 MG PO CAPS
100.0000 mg | ORAL_CAPSULE | Freq: Two times a day (BID) | ORAL | Status: DC
  Administered 2015-12-26 (×2): 100 mg via ORAL
  Filled 2015-12-26 (×2): qty 1

## 2015-12-26 MED ORDER — PHENYLEPHRINE HCL 10 MG/ML IJ SOLN
20.0000 mg | INTRAVENOUS | Status: DC | PRN
  Administered 2015-12-26: 20 ug/min via INTRAVENOUS

## 2015-12-26 MED ORDER — POLYETHYLENE GLYCOL 3350 17 G PO PACK: 17.0000 g | PACK | Freq: Every day | ORAL | Status: DC | PRN

## 2015-12-26 MED ORDER — SIMETHICONE 80 MG PO CHEW: 80.0000 mg | CHEWABLE_TABLET | Freq: Four times a day (QID) | ORAL | Status: DC | PRN

## 2015-12-26 MED ORDER — HYDROMORPHONE HCL 1 MG/ML IJ SOLN
0.2500 mg | INTRAMUSCULAR | Status: DC | PRN
  Administered 2015-12-26 (×2): 0.5 mg via INTRAVENOUS

## 2015-12-26 MED ORDER — MIDAZOLAM HCL 2 MG/2ML IJ SOLN
INTRAMUSCULAR | Status: AC
  Filled 2015-12-26: qty 2

## 2015-12-26 MED ORDER — FLUTICASONE PROPIONATE 50 MCG/ACT NA SUSP
1.0000 | Freq: Every day | NASAL | Status: DC
  Administered 2015-12-26 – 2015-12-27 (×2): 1 via NASAL
  Filled 2015-12-26: qty 16

## 2015-12-26 MED ORDER — GLYCOPYRROLATE 0.2 MG/ML IJ SOLN
INTRAMUSCULAR | Status: DC | PRN
  Administered 2015-12-26: .8 mg via INTRAVENOUS

## 2015-12-26 MED ORDER — DEXAMETHASONE SODIUM PHOSPHATE 4 MG/ML IJ SOLN
INTRAMUSCULAR | Status: DC | PRN
  Administered 2015-12-26: 4 mg via INTRAVENOUS

## 2015-12-26 MED ORDER — LORATADINE 10 MG PO TABS
10.0000 mg | ORAL_TABLET | Freq: Every day | ORAL | Status: DC
  Administered 2015-12-26: 10 mg via ORAL
  Filled 2015-12-26 (×2): qty 1

## 2015-12-26 MED ORDER — PANTOPRAZOLE SODIUM 40 MG PO TBEC
40.0000 mg | DELAYED_RELEASE_TABLET | Freq: Every day | ORAL | Status: DC
  Administered 2015-12-26: 40 mg via ORAL
  Filled 2015-12-26: qty 1

## 2015-12-26 MED ORDER — ESTRADIOL 0.1 MG/GM VA CREA
TOPICAL_CREAM | VAGINAL | Status: AC
  Filled 2015-12-26: qty 42.5

## 2015-12-26 SURGICAL SUPPLY — 45 items
CABLE HIGH FREQUENCY MONO STRZ (ELECTRODE) IMPLANT
CANISTER SUCT 3000ML (MISCELLANEOUS) ×6 IMPLANT
CATH ROBINSON RED A/P 16FR (CATHETERS) ×6 IMPLANT
CLOTH BEACON ORANGE TIMEOUT ST (SAFETY) ×6 IMPLANT
COVER BACK TABLE 60X90IN (DRAPES) ×6 IMPLANT
DECANTER SPIKE VIAL GLASS SM (MISCELLANEOUS) ×12 IMPLANT
DRSG COVADERM PLUS 2X2 (GAUZE/BANDAGES/DRESSINGS) ×12 IMPLANT
DRSG OPSITE POSTOP 3X4 (GAUZE/BANDAGES/DRESSINGS) ×6 IMPLANT
DURAPREP 26ML APPLICATOR (WOUND CARE) ×6 IMPLANT
ELECT LIGASURE LONG (ELECTRODE) ×6 IMPLANT
ELECT REM PT RETURN 9FT ADLT (ELECTROSURGICAL) ×6
ELECTRODE REM PT RTRN 9FT ADLT (ELECTROSURGICAL) ×4 IMPLANT
FILTER SMOKE EVAC LAPAROSHD (FILTER) IMPLANT
FORCEPS CUTTING 45CM 5MM (CUTTING FORCEPS) ×6 IMPLANT
GAUZE PACKING 2X5 YD STRL (GAUZE/BANDAGES/DRESSINGS) ×6 IMPLANT
GLOVE BIO SURGEON STRL SZ 6 (GLOVE) ×12 IMPLANT
GLOVE BIOGEL PI IND STRL 6 (GLOVE) ×8 IMPLANT
GLOVE BIOGEL PI IND STRL 7.0 (GLOVE) ×12 IMPLANT
GLOVE BIOGEL PI INDICATOR 6 (GLOVE) ×4
GLOVE BIOGEL PI INDICATOR 7.0 (GLOVE) ×6
LEGGING LITHOTOMY PAIR STRL (DRAPES) ×6 IMPLANT
LIQUID BAND (GAUZE/BANDAGES/DRESSINGS) ×12 IMPLANT
MANIPULATOR UTERINE 4.5 ZUMI (MISCELLANEOUS) IMPLANT
NEEDLE INSUFFLATION 120MM (ENDOMECHANICALS) ×6 IMPLANT
NEEDLE INSUFFLATION 150MM (ENDOMECHANICALS) ×6 IMPLANT
NS IRRIG 1000ML POUR BTL (IV SOLUTION) ×6 IMPLANT
PACK LAVH (CUSTOM PROCEDURE TRAY) ×6 IMPLANT
PACK ROBOTIC GOWN (GOWN DISPOSABLE) ×6 IMPLANT
PAD TRENDELENBURG POSITION (MISCELLANEOUS) ×6 IMPLANT
SEALER TISSUE G2 CVD JAW 45CM (ENDOMECHANICALS) IMPLANT
SET IRRIG TUBING LAPAROSCOPIC (IRRIGATION / IRRIGATOR) IMPLANT
SLEEVE XCEL OPT CAN 5 100 (ENDOMECHANICALS) ×6 IMPLANT
SUT MNCRL 0 MO-4 VIOLET 18 CR (SUTURE) ×8 IMPLANT
SUT MNCRL AB 3-0 PS2 27 (SUTURE) ×6 IMPLANT
SUT MON AB 2-0 CT1 36 (SUTURE) ×6 IMPLANT
SUT MONOCRYL 0 MO 4 18  CR/8 (SUTURE) ×4
SUT VICRYL 0 TIES 12 18 (SUTURE) ×6 IMPLANT
SUT VICRYL 0 UR6 27IN ABS (SUTURE) ×6 IMPLANT
TOWEL OR 17X24 6PK STRL BLUE (TOWEL DISPOSABLE) ×12 IMPLANT
TRAY FOLEY CATH SILVER 14FR (SET/KITS/TRAYS/PACK) ×6 IMPLANT
TROCAR 12M 150ML BLUNT (TROCAR) ×6 IMPLANT
TROCAR 5M 150ML BLDLS (TROCAR) ×6 IMPLANT
TROCAR XCEL NON-BLD 11X100MML (ENDOMECHANICALS) ×6 IMPLANT
TROCAR XCEL NON-BLD 5MMX100MML (ENDOMECHANICALS) ×6 IMPLANT
WARMER LAPAROSCOPE (MISCELLANEOUS) ×6 IMPLANT

## 2015-12-26 NOTE — Transfer of Care (Signed)
Immediate Anesthesia Transfer of Care Note  Patient: Amy Jarvis  Procedure(s) Performed: Procedure(s): LAPAROSCOPIC ASSISTED VAGINAL HYSTERECTOMY WITH SALPINGO OOPHORECTOMY (Bilateral) POSTERIOR REPAIR (RECTOCELE) (N/A) CYSTOSCOPY (N/A)  Patient Location: PACU  Anesthesia Type:General  Level of Consciousness: awake, alert  and oriented  Airway & Oxygen Therapy: Patient Spontanous Breathing and Patient connected to nasal cannula oxygen  Post-op Assessment: Report given to RN and Post -op Vital signs reviewed and stable  Post vital signs: Reviewed and stable  Last Vitals:  Filed Vitals:   12/26/15 0604  BP: 143/71  Pulse: 102  Temp: 36.3 C  Resp: 20    Complications: No apparent anesthesia complications

## 2015-12-26 NOTE — Op Note (Addendum)
PROCEDURE DATE: 12/26/2015  PREOPERATIVE DIAGNOSIS: Pelvic organ prolapse  POSTOPERATIVE DIAGNOSIS: The same  PROCEDURE: Laparoscopic Assisted Vaginal Hysterectomy, Bilateral salpingo-oophorectomy, posterior colporrhaphy, cystoscopy SURGEON: Dr. Linda Hedges  ASSISTANT: Dr. Molli Posey INDICATIONS: 55 y.o. G3P3 with symptomatic pelvic organ prolapse desiring definitive surgical management. Risks of surgery were discussed with the patient including but not limited to: bleeding which may require transfusion or reoperation; infection which may require antibiotics; injury to bowel, bladder, ureters or other surrounding organs; need for additional procedures including laparotomy; thromboembolic phenomenon, incisional problems and other postoperative/anesthesia complications. Written informed consent was obtained.  FINDINGS: Small fibroid uterus, normal adnexa bilaterally. No evidence of endometriosis. Normal upper abdomen. Normal appendix. Stage 3 rectocele, Stage 2 cystocele ANESTHESIA: General  ESTIMATED BLOOD LOSS: 300 ml  SPECIMENS: Uterus, cervix, bilateral fallopian tubes and ovaries COMPLICATIONS: None immediate  PROCEDURE IN DETAIL: The patient received intravenous antibiotics and had sequential compression devices applied to her lower extremities while in the preoperative area. She was then taken to the operating room where general anesthesia was administered and was found to be adequate. She was placed in the dorsal lithotomy position, and was prepped and draped in a sterile manner. An in and out catheterization was performed. A uterine manipulator was then advanced into the uterus . After an adequate timeout was performed, attention was then turned to the patient's abdomen where a 12-mm skin incision was made in the umbilical fold. The Veress needle was carefully introduced into the peritoneal cavity through the abdominal wall. Intraperitoneal placement was confirmed by drop in intraabdominal  pressure with insufflation of carbon dioxide gas. Adequate pneumoperitoneum was obtained, and the 12 mm trocar and sleeve were then advanced without difficulty into the abdomen where intraabdominal placement was confirmed by the laparoscope. A survey of the patient's pelvis and abdomen revealed entirely normal anatomy.  Suprapubic 5 mm port was then placed under direct visualization. At this time, the right cornu was grasped and the right fallopian tube, uteroovarian ligament, and round ligaments were doubly coagulated with bipolar electrocautery and transected without difficulty. The remainder of the uterine vessels and anterior and posterior leaves of the broad ligament, as well as the cardinal ligament was coagulated and transected in a serial fashion down to level of the uterine artery. A similar procedure was carried out on the left with the left uterine cornu identified. The left fallopian tube, uteroovarian ligament, and round ligaments were doubly coagulated with bipolar electrocautery and transected. The remainder of the cardinal ligament, uterine vessels, anterior, and posterior sheaths of the broad ligament were coagulated and transected in a serial manner to the level of the uterine artery. At this time, attention was turned to the vaginal hysterectomy. The laparoscope was removed.   The uterine manipulator was removed and the anterior and posterior leafs of the cervix were grasped with Lahey tenaculum. A circumferential incision was then made at the cervicovaginal portio using Bovie cautery. The anterior and posterior colpotomies were accomplished with a combination of blunt and sharp dissection without difficulty. The right uterosacral ligament was clamped, transected, and ligated with #0 Monocryl sutures. The left uterosacral ligament was clamped, transected, and ligated with #0 Monocryl suture. The parametrial tissue was then clamped bilaterally, cauterized and transected using the Ligasure. The  uterus was then removed and passed off the operative field. Laparotomy pack was placed into the pelvis. The pedicles were evaluated. There was no bleeding noted; therefore, the laparotomy pack was removed. The uterosacral ligaments were suture fixated into the vaginal cuff  angles with #0 Monocryl sutures. McCall's culdoplasty stitches were placed x 2, proximal and distal, and were held with hemostats. The peritoneum was closed in a purse string fashion again using 0 Monocryl.  The vaginal cuff was then closed using 0 Monocryl figure of eight stitches. Following cuff closure, the McCall's stitches were tied down with excellent suspension of the apex and improvement in the cystocele.  Hemostasis was noted throughout. Attention was then turned to the posterior colporrhaphy.  The hymenal ring was grasped at the 4 and 8 o'clock positions. A triangular incision was made to within a centimeter of the anus. The intervening tissue was excised. The vagina was then opened in the midline to within a centimeter of the cuff.. The perineal body was freed and the rectum freed from the overlying vagina. Perirectal fascia was then transversely sutured in the midline with several interrupted 0 Monocryl for support. Redundant vaginal mucosa was excised and the vagina closed in the midline in running locked fashion with 0 Monocryl. Perineal incision was then closed using the 0 Monocoryl in the typical fashion. Estrace coated vaginal packing was placed. Attention was returned to the abdomen.  At this time, the laparoscope was reinserted into the abdomen. The abdomen was reinsufflated. No bleeding was noted from the pedicles. The suprapubic trocar sheath was then removed under laparoscopic visualization. The laparoscope was removed. The carbon dioxide was allowed to escape from the abdomen and the infraumbilical trocar sheath was then removed. The infraumbilical fascial incision was closed with 0 Vicryl in a figure of eight stitch.   The skin incisions were closed with #4-0 Monocryl in subcuticular fashion. Abdominal incisions were closed with skin glue and op site dressings.  Cystoscope was performed to ensure ureteral patency and absence of bladder trauma.  Normal saline was instilled and no injury was noted.  Bilateral ureteral jets were seen.  Cystoscope was removed.   The instrument, sponge, and needle counts were correct.

## 2015-12-26 NOTE — Progress Notes (Signed)
No complaints.  Pain well controlled.  No nausea or vomiting.  No CP/SOB.  VSS. AF.   UOP adequate.  Gen: A&O x 2 Abd: soft, ND, dressings c/d x 2 Ext: SCDs on and functioning  POD#0 s/p LAVH, BSO, posterior colporrhaphy, cystoscopy -Continue pain and nausea control -Advance diet -Ambulation -AM labs pending -Vag pack to be removed in AM  Linda Hedges, DO

## 2015-12-26 NOTE — Progress Notes (Signed)
No change to H&P.  Hamlin Devine, DO 

## 2015-12-26 NOTE — Anesthesia Preprocedure Evaluation (Signed)
Anesthesia Evaluation  Patient identified by MRN, date of birth, ID band Patient awake    Reviewed: Allergy & Precautions, NPO status , Patient's Chart, lab work & pertinent test results  History of Anesthesia Complications Negative for: history of anesthetic complications  Airway Mallampati: III  TM Distance: >3 FB Neck ROM: Full    Dental  (+) Teeth Intact   Pulmonary neg shortness of breath, sleep apnea and Continuous Positive Airway Pressure Ventilation , neg COPD,    breath sounds clear to auscultation       Cardiovascular hypertension, Pt. on medications (-) angina(-) Past MI and (-) CHF  Rhythm:Regular     Neuro/Psych negative neurological ROS  negative psych ROS   GI/Hepatic Neg liver ROS, GERD  Medicated and Controlled,  Endo/Other  Morbid obesity  Renal/GU negative Renal ROS     Musculoskeletal  (+) Arthritis ,   Abdominal   Peds  Hematology negative hematology ROS (+)   Anesthesia Other Findings   Reproductive/Obstetrics                             Anesthesia Physical Anesthesia Plan  ASA: II  Anesthesia Plan:    Post-op Pain Management:    Induction: Intravenous  Airway Management Planned: Oral ETT  Additional Equipment: None  Intra-op Plan:   Post-operative Plan: Extubation in OR  Informed Consent: I have reviewed the patients History and Physical, chart, labs and discussed the procedure including the risks, benefits and alternatives for the proposed anesthesia with the patient or authorized representative who has indicated his/her understanding and acceptance.   Dental advisory given  Plan Discussed with: Surgeon and CRNA  Anesthesia Plan Comments:         Anesthesia Quick Evaluation

## 2015-12-26 NOTE — Anesthesia Procedure Notes (Signed)
Procedure Name: Intubation Date/Time: 12/26/2015 7:47 AM Performed by: Elenore Paddy Pre-anesthesia Checklist: Patient identified, Emergency Drugs available, Suction available and Patient being monitored Patient Re-evaluated:Patient Re-evaluated prior to inductionOxygen Delivery Method: Circle system utilized Preoxygenation: Pre-oxygenation with 100% oxygen Intubation Type: IV induction Ventilation: Mask ventilation without difficulty Laryngoscope Size: Mac and 3 Grade View: Grade II Number of attempts: 1 Airway Equipment and Method: Stylet (Positioned on blankets in sniffing position.) Placement Confirmation: ETT inserted through vocal cords under direct vision,  positive ETCO2 and breath sounds checked- equal and bilateral Secured at: 21 cm Tube secured with: Tape Dental Injury: Teeth and Oropharynx as per pre-operative assessment

## 2015-12-27 ENCOUNTER — Encounter (HOSPITAL_COMMUNITY): Payer: Self-pay | Admitting: Obstetrics & Gynecology

## 2015-12-27 DIAGNOSIS — Z23 Encounter for immunization: Secondary | ICD-10-CM | POA: Diagnosis not present

## 2015-12-27 DIAGNOSIS — N840 Polyp of corpus uteri: Secondary | ICD-10-CM | POA: Diagnosis not present

## 2015-12-27 DIAGNOSIS — D259 Leiomyoma of uterus, unspecified: Secondary | ICD-10-CM | POA: Diagnosis not present

## 2015-12-27 DIAGNOSIS — N888 Other specified noninflammatory disorders of cervix uteri: Secondary | ICD-10-CM | POA: Diagnosis not present

## 2015-12-27 DIAGNOSIS — K219 Gastro-esophageal reflux disease without esophagitis: Secondary | ICD-10-CM | POA: Diagnosis not present

## 2015-12-27 DIAGNOSIS — N8189 Other female genital prolapse: Secondary | ICD-10-CM | POA: Diagnosis not present

## 2015-12-27 LAB — COMPREHENSIVE METABOLIC PANEL
ALK PHOS: 102 U/L (ref 38–126)
ALT: 13 U/L — AB (ref 14–54)
ANION GAP: 8 (ref 5–15)
AST: 14 U/L — ABNORMAL LOW (ref 15–41)
Albumin: 3.2 g/dL — ABNORMAL LOW (ref 3.5–5.0)
BILIRUBIN TOTAL: 0.6 mg/dL (ref 0.3–1.2)
BUN: 15 mg/dL (ref 6–20)
CALCIUM: 9.1 mg/dL (ref 8.9–10.3)
CO2: 28 mmol/L (ref 22–32)
CREATININE: 0.72 mg/dL (ref 0.44–1.00)
Chloride: 105 mmol/L (ref 101–111)
Glucose, Bld: 103 mg/dL — ABNORMAL HIGH (ref 65–99)
Potassium: 4.3 mmol/L (ref 3.5–5.1)
Sodium: 141 mmol/L (ref 135–145)
TOTAL PROTEIN: 5.7 g/dL — AB (ref 6.5–8.1)

## 2015-12-27 LAB — CBC
HEMATOCRIT: 30.1 % — AB (ref 36.0–46.0)
HEMOGLOBIN: 9.5 g/dL — AB (ref 12.0–15.0)
MCH: 25.8 pg — AB (ref 26.0–34.0)
MCHC: 31.6 g/dL (ref 30.0–36.0)
MCV: 81.8 fL (ref 78.0–100.0)
Platelets: 298 10*3/uL (ref 150–400)
RBC: 3.68 MIL/uL — AB (ref 3.87–5.11)
RDW: 16.3 % — ABNORMAL HIGH (ref 11.5–15.5)
WBC: 10.1 10*3/uL (ref 4.0–10.5)

## 2015-12-27 MED ORDER — OXYCODONE-ACETAMINOPHEN 5-325 MG PO TABS: 1.0000 | ORAL_TABLET | ORAL | Status: DC | PRN

## 2015-12-27 MED ORDER — IBUPROFEN 800 MG PO TABS: 800.0000 mg | ORAL_TABLET | Freq: Four times a day (QID) | ORAL | Status: DC | PRN

## 2015-12-27 NOTE — Progress Notes (Signed)
1 Day Post-Op Procedure(s) (LRB): LAPAROSCOPIC ASSISTED VAGINAL HYSTERECTOMY WITH SALPINGO OOPHORECTOMY (Bilateral) POSTERIOR REPAIR (RECTOCELE) (N/A) CYSTOSCOPY (N/A)  Subjective: Patient reports tolerating PO.  Foley out just this AM; no void yet.  No pain.  Taking Toradol only.  No N/V.  Ambulating well.  No flatus.    Objective: I have reviewed patient's vital signs, intake and output, medications and labs.  General: alert, cooperative and appears stated age GI: normal findings: appropriately tender to palpation and incision: clean, dry and intact Extremities: extremities normal, atraumatic, no cyanosis or edema vaginal packing removed; scan dark blood on packing  Assessment: s/p Procedure(s): LAPAROSCOPIC ASSISTED VAGINAL HYSTERECTOMY WITH SALPINGO OOPHORECTOMY (Bilateral) POSTERIOR REPAIR (RECTOCELE) (N/A) CYSTOSCOPY (N/A): stable, progressing well and tolerating diet  Plan: Advance diet Discharge home after voids.     Alinah Sheard, [place] 12/27/2015, 8:19 AM

## 2015-12-27 NOTE — Progress Notes (Signed)
Patient discharged home with son... Discharge instructions reviewed with patient and she verbalized understanding... Condition stable... No equipment... Ambulated to car with P. Cates, NT.

## 2015-12-27 NOTE — Discharge Instructions (Signed)
Call MD for T>100.4, heavy vaginal bleeding, severe abdominal pain, intractable nausea and/or vomiting, or respiratory distress.  Call office to schedule postop appt in 2 weeks.  No driving while taking narcotics.  No heavy lifting.  Pelvic rest x 6 weeks.

## 2015-12-27 NOTE — Discharge Summary (Signed)
Physician Discharge Summary  Patient ID: Amy Jarvis MRN: [id number] DOB/AGE: Sep 13, 1961 55 y.o.  Admit date: 12/26/2015 Discharge date: 12/27/2015  Admission Diagnoses:  Pelvic organ prolapse  Discharge Diagnoses: SAA Active Problems:   S/P hysterectomy with oophorectomy   Discharged Condition: good  Hospital Course: Patient was admitted for surgical treatment of symptomatic pelvic organ prolapse.  The patient underwent an uncomplicated LAVH, BSO, posterior colporrhaphy, cystoscopy.  The patient had an entirely normal postoperative course and was meeting all goals on POD#1.  She was discharged home with standard precautions.    Consults: None  Significant Diagnostic Studies: labs: Hgb 9  Treatments: IV hydration, analgesia: toradol and surgery: LAVH, BSO, posterior colporrhaphy, cysto  Discharge Exam: Blood pressure 111/64, pulse 82, temperature 98.2 F (36.8 C), temperature source Oral, resp. rate 18, height 5\' 7"  (1.702 m), weight 142.883 kg (315 lb), last menstrual period 03/31/2013, SpO2 95 %. General appearance: alert, cooperative and appears stated age GI: normal findings: approp ttp Incision/Wound:c/d x 2  Disposition: 01-Home or Self Care     Medication List    STOP taking these medications        acetaminophen 500 MG tablet  Commonly known as:  TYLENOL      TAKE these medications        amLODipine 10 MG tablet  Commonly known as:  NORVASC  Take 1 tablet (10 mg total) by mouth daily.     diclofenac sodium 1 % Gel  Commonly known as:  VOLTAREN  Apply 4 g topically 4 (four) times daily.     esomeprazole 40 MG capsule  Commonly known as:  NEXIUM  Take 1 capsule (40 mg total) by mouth daily before breakfast.     Fish Oil 1000 MG Caps  Take 1,000 mg by mouth daily.     fluticasone 50 MCG/ACT nasal spray  Commonly known as:  FLONASE  Place 1 spray into both nostrils daily.     hydrocortisone 2.5 % lotion  Apply 1 application topically 2 (two)  times daily.     ibuprofen 800 MG tablet  Commonly known as:  ADVIL  Take 1 tablet (800 mg total) by mouth every 6 (six) hours as needed.     loratadine 10 MG tablet  Commonly known as:  CLARITIN  Take 1 tablet (10 mg total) by mouth daily.     losartan 100 MG tablet  Commonly known as:  COZAAR  take 1 tablet by mouth once daily     Magnesium 250 MG Tabs  Take 250 mg by mouth daily.     meclizine 25 MG tablet  Commonly known as:  ANTIVERT  Take 1 tablet (25 mg total) by mouth 3 (three) times daily as needed.     multivitamin with minerals Tabs tablet  Take 1 tablet by mouth daily.     oxyCODONE-acetaminophen 5-325 MG tablet  Commonly known as:  PERCOCET/ROXICET  Take 1-2 tablets by mouth every 4 (four) hours as needed (moderate to severe pain (when tolerating fluids)).     polyethylene glycol packet  Commonly known as:  MIRALAX / GLYCOLAX  Take 17 g by mouth daily as needed for moderate constipation.     potassium chloride SA 20 MEQ tablet  Commonly known as:  K-DUR,KLOR-CON  Take 1 tablet (20 mEq total) by mouth daily.     VITAMIN C PO  Take 1 tablet by mouth daily.         SignedLinda Hedges 12/27/2015, 8:25 AM

## 2015-12-27 NOTE — Anesthesia Postprocedure Evaluation (Signed)
Anesthesia Post Note  Patient: Amy Jarvis  Procedure(s) Performed: Procedure(s) (LRB): LAPAROSCOPIC ASSISTED VAGINAL HYSTERECTOMY WITH SALPINGO OOPHORECTOMY (Bilateral) POSTERIOR REPAIR (RECTOCELE) (N/A) CYSTOSCOPY (N/A)  Patient location during evaluation: [facility] Anesthesia Type: General Level of consciousness: awake and alert and oriented Pain management: pain level controlled Vital Signs Assessment: post-procedure vital signs reviewed and stable Respiratory status: spontaneous breathing Cardiovascular status: blood pressure returned to baseline Postop Assessment: adequate PO intake and no signs of nausea or vomiting Anesthetic complications: no    Last Vitals:  Filed Vitals:   12/27/15 0112 12/27/15 0530  BP: 109/54 111/64  Pulse: 77 82  Temp: 36.5 C 36.8 C  Resp: 16 18    Last Pain:  Filed Vitals:   12/27/15 0750  PainSc: Asleep                 Elgin Carn

## 2016-01-01 NOTE — Anesthesia Postprocedure Evaluation (Signed)
Anesthesia Post Note  Patient: Amy Jarvis  Procedure(s) Performed: Procedure(s) (LRB): LAPAROSCOPIC ASSISTED VAGINAL HYSTERECTOMY WITH SALPINGO OOPHORECTOMY (Bilateral) POSTERIOR REPAIR (RECTOCELE) (N/A) CYSTOSCOPY (N/A)  Patient location during evaluation: PACU Anesthesia Type: General Level of consciousness: awake Pain management: pain level controlled Vital Signs Assessment: post-procedure vital signs reviewed and stable Respiratory status: spontaneous breathing Cardiovascular status: stable Postop Assessment: no signs of nausea or vomiting Anesthetic complications: no    Last Vitals:  Filed Vitals:   12/27/15 0530 12/27/15 1232  BP: 111/64 128/52  Pulse: 82 70  Temp: 36.8 C 37.1 C  Resp: 18 18    Last Pain:  Filed Vitals:   12/27/15 1254  PainSc: 3                  Camika Marsico

## 2016-02-29 ENCOUNTER — Emergency Department (HOSPITAL_COMMUNITY)
Admission: EM | Admit: 2016-02-29 | Discharge: 2016-02-29 | Disposition: A | Discharge status: Home / Self Care | Payer: Medicare Other | Attending: Emergency Medicine | Admitting: Emergency Medicine

## 2016-02-29 ENCOUNTER — Encounter (HOSPITAL_COMMUNITY): Payer: Self-pay | Admitting: *Deleted

## 2016-02-29 ENCOUNTER — Emergency Department (HOSPITAL_COMMUNITY): Payer: Medicare Other

## 2016-02-29 DIAGNOSIS — I10 Essential (primary) hypertension: Secondary | ICD-10-CM | POA: Insufficient documentation

## 2016-02-29 DIAGNOSIS — Z8701 Personal history of pneumonia (recurrent): Secondary | ICD-10-CM | POA: Insufficient documentation

## 2016-02-29 DIAGNOSIS — Z79899 Other long term (current) drug therapy: Secondary | ICD-10-CM | POA: Insufficient documentation

## 2016-02-29 DIAGNOSIS — E785 Hyperlipidemia, unspecified: Secondary | ICD-10-CM | POA: Insufficient documentation

## 2016-02-29 DIAGNOSIS — R509 Fever, unspecified: Secondary | ICD-10-CM | POA: Diagnosis not present

## 2016-02-29 DIAGNOSIS — J209 Acute bronchitis, unspecified: Secondary | ICD-10-CM | POA: Diagnosis not present

## 2016-02-29 DIAGNOSIS — Z791 Long term (current) use of non-steroidal anti-inflammatories (NSAID): Secondary | ICD-10-CM | POA: Insufficient documentation

## 2016-02-29 DIAGNOSIS — Z9981 Dependence on supplemental oxygen: Secondary | ICD-10-CM | POA: Insufficient documentation

## 2016-02-29 DIAGNOSIS — G4733 Obstructive sleep apnea (adult) (pediatric): Secondary | ICD-10-CM | POA: Insufficient documentation

## 2016-02-29 DIAGNOSIS — J40 Bronchitis, not specified as acute or chronic: Secondary | ICD-10-CM | POA: Diagnosis not present

## 2016-02-29 DIAGNOSIS — R05 Cough: Secondary | ICD-10-CM | POA: Diagnosis not present

## 2016-02-29 DIAGNOSIS — E669 Obesity, unspecified: Secondary | ICD-10-CM | POA: Insufficient documentation

## 2016-02-29 DIAGNOSIS — R0602 Shortness of breath: Secondary | ICD-10-CM | POA: Diagnosis not present

## 2016-02-29 LAB — I-STAT CHEM 8, ED
BUN: 13 mg/dL (ref 6–20)
CREATININE: 0.6 mg/dL (ref 0.44–1.00)
Calcium, Ion: 1.14 mmol/L (ref 1.12–1.23)
Chloride: 104 mmol/L (ref 101–111)
Glucose, Bld: 95 mg/dL (ref 65–99)
HEMATOCRIT: 37 % (ref 36.0–46.0)
HEMOGLOBIN: 12.6 g/dL (ref 12.0–15.0)
POTASSIUM: 3.8 mmol/L (ref 3.5–5.1)
SODIUM: 143 mmol/L (ref 135–145)
TCO2: 28 mmol/L (ref 0–100)

## 2016-02-29 LAB — I-STAT TROPONIN, ED: TROPONIN I, POC: 0 ng/mL (ref 0.00–0.08)

## 2016-02-29 MED ORDER — ALBUTEROL SULFATE (2.5 MG/3ML) 0.083% IN NEBU
2.5000 mg | INHALATION_SOLUTION | Freq: Once | RESPIRATORY_TRACT | Status: AC
  Administered 2016-02-29: 2.5 mg via RESPIRATORY_TRACT
  Filled 2016-02-29: qty 3

## 2016-02-29 MED ORDER — METHYLPREDNISOLONE SODIUM SUCC 125 MG IJ SOLR: 125.0000 mg | Freq: Once | INTRAMUSCULAR | Status: DC

## 2016-02-29 MED ORDER — PREDNISONE 20 MG PO TABS
60.0000 mg | ORAL_TABLET | Freq: Once | ORAL | Status: AC
  Administered 2016-02-29: 60 mg via ORAL
  Filled 2016-02-29: qty 3

## 2016-02-29 MED ORDER — ALBUTEROL SULFATE HFA 108 (90 BASE) MCG/ACT IN AERS: 2.0000 | INHALATION_SPRAY | RESPIRATORY_TRACT | Status: DC | PRN

## 2016-02-29 MED ORDER — IPRATROPIUM BROMIDE 0.02 % IN SOLN
0.5000 mg | Freq: Once | RESPIRATORY_TRACT | Status: AC
  Administered 2016-02-29: 0.5 mg via RESPIRATORY_TRACT
  Filled 2016-02-29: qty 2.5

## 2016-02-29 MED ORDER — AZITHROMYCIN 250 MG PO TABS: 250.0000 mg | ORAL_TABLET | Freq: Every day | ORAL | Status: DC

## 2016-02-29 NOTE — Discharge Instructions (Signed)

## 2016-02-29 NOTE — ED Notes (Signed)
Pt ambulated with ease around the nurses station.  SpO2 remained 96-100% RA.

## 2016-02-29 NOTE — ED Provider Notes (Signed)
CSN: [id number]     Arrival date & time 02/29/16  0908 History   First MD Initiated Contact with Patient 02/29/16 0913     Chief Complaint  Patient presents with  . Cough  . Shortness of Breath     (Consider location/radiation/quality/duration/timing/severity/associated sxs/prior Treatment) HPI   Amy Jarvis is a 55 y.o. female with PMH significant for HLD, morbid obesity, HTN, GERD, DDD, allergic rhinitis who presents with gradual onset, constant, moderate, worsening SOB that began last night. She reports history of cold symptoms that began Monday (6 days ago) including scratchy throat, nasal congestion, and productive (dark brown and now yellow) cough. Associated symptoms include subjective fever, chills, nasal congestion, and nausea.  Denies CP, ear pain, vomiting, abdominal pain, hemoptysis, or unilateral leg swelling.  No hx of DVT/PE, recent surgery/trauma/immobilization/active malignancy.  She does not smoke.  She reports taking Mucinex, tylenol, and ibuprofen for her symptoms.   Past Medical History  Diagnosis Date  . Hyperlipidemia LDL goal < 130 09/25/2008  . Morbid obesity 08/28/2010  . Obstructive sleep apnea on CPAP 06/20/2010  . Hypertension 09/25/2008  . GERD 09/05/2009  . Diverticulosis 03/26/2010  . Microscopic hematuria 01/13/2010  . Cystocele 01/13/2010  . Uterine prolapse 11/19/2008  . Perimenopausal 01/13/2010  . Vertigo 10/07/2012  . Degenerative disk disease 10/07/2012  . Cervical polyp     possible history of cervical polyp  . Allergic rhinitis 09/23/2015  . Pneumonia     6/16  . Degenerative lumbar spinal stenosis   . DDD (degenerative disc disease), lumbar   . Carpal tunnel syndrome, bilateral    Past Surgical History  Procedure Laterality Date  . Cholecystectomy  2010  . Rotator cuff repair      left  . Tubal ligation  1992  . Hysteroscopy w/d&c N/A 01/02/2014    Procedure: DILATATION AND CURETTAGE /HYSTEROSCOPY;  Surgeon: Linda Hedges, DO;  Location:  [facility];  Service: Gynecology;  Laterality: N/A;  . Colonoscopy    . Laparoscopic vaginal hysterectomy with salpingo oophorectomy Bilateral 12/26/2015    Procedure: LAPAROSCOPIC ASSISTED VAGINAL HYSTERECTOMY WITH SALPINGO OOPHORECTOMY;  Surgeon: Linda Hedges, DO;  Location: [facility];  Service: Gynecology;  Laterality: Bilateral;  . Rectocele repair N/A 12/26/2015    Procedure: POSTERIOR REPAIR (RECTOCELE);  Surgeon: Linda Hedges, DO;  Location: [facility];  Service: Gynecology;  Laterality: N/A;  . Cystoscopy N/A 12/26/2015    Procedure: CYSTOSCOPY;  Surgeon: Linda Hedges, DO;  Location: [facility];  Service: Gynecology;  Laterality: N/A;   Family History  Problem Relation Age of Onset  . Colon cancer Mother 26  . Hypertension Mother   . Stroke Mother 24  . Breast cancer Other     maternal great-aunt  . Cancer Other     maternal great aunt with breast cancer   Social History  Substance Use Topics  . Smoking status: Never Smoker   . Smokeless tobacco: Never Used  . Alcohol Use: No   OB History    Gravida Para Term Preterm AB TAB SAB Ectopic Multiple Living   3 3 3  0 0 0 0 0 0 3     Review of Systems All other systems negative unless otherwise stated in HPI   Allergies  Ace inhibitors and Promethazine hcl  Home Medications   Prior to Admission medications   Medication Sig Start Date End Date Taking? Authorizing Provider  acetaminophen (TYLENOL) 500 MG tablet Take 1,000 mg by mouth every 8 (eight) hours as needed for mild  pain or moderate pain.   Yes Historical Provider, MD  amLODipine (NORVASC) 10 MG tablet Take 1 tablet (10 mg total) by mouth daily. 10/23/15 10/22/16 Yes Norman Herrlich, MD  Ascorbic Acid (VITAMIN C PO) Take 1 tablet by mouth daily.   Yes Historical Provider, MD  diclofenac sodium (VOLTAREN) 1 % GEL Apply 4 g topically 4 (four) times daily. Patient taking differently: Apply 4 g topically 4 (four) times daily as needed (for pain).  10/23/15  Yes Norman Herrlich, MD   esomeprazole (NEXIUM) 40 MG capsule Take 1 capsule (40 mg total) by mouth daily before breakfast. 08/07/15  Yes Norman Herrlich, MD  fluticasone [facility]) 50 MCG/ACT nasal spray Place 1 spray into both nostrils daily. 09/23/15 09/22/16 Yes Oval Linsey, MD  hydrocortisone 2.5 % lotion Apply 1 application topically 2 (two) times daily as needed.    Yes Historical Provider, MD  ibuprofen (ADVIL,MOTRIN) 800 MG tablet Take 1 tablet (800 mg total) by mouth every 6 (six) hours as needed. 12/27/15  Yes Megan Morris, DO  loratadine (CLARITIN) 10 MG tablet Take 1 tablet (10 mg total) by mouth daily. 09/23/15 09/22/16 Yes Oval Linsey, MD  losartan (COZAAR) 100 MG tablet take 1 tablet by mouth once daily 12/19/15  Yes Norman Herrlich, MD  Magnesium 250 MG TABS Take 250 mg by mouth daily.    Yes Historical Provider, MD  meclizine (ANTIVERT) 25 MG tablet Take 1 tablet (25 mg total) by mouth 3 (three) times daily as needed. Patient taking differently: Take 25 mg by mouth 3 (three) times daily as needed for dizziness.  05/02/15  Yes Nino Glow McLean-Scocozza, MD  mirabegron ER (MYRBETRIQ) 50 MG TB24 tablet Take 50 mg by mouth daily.   Yes Historical Provider, MD  Multiple Vitamin (MULTIVITAMIN WITH MINERALS) TABS Take 1 tablet by mouth daily.    Yes Historical Provider, MD  Omega-3 Fatty Acids (FISH OIL) 1000 MG CAPS Take 1,000 mg by mouth daily.    Yes Historical Provider, MD  oxyCODONE-acetaminophen (PERCOCET/ROXICET) 5-325 MG tablet Take 1-2 tablets by mouth every 4 (four) hours as needed (moderate to severe pain (when tolerating fluids)). 12/27/15  Yes Megan Morris, DO  Phenylephrine-APAP-Guaifenesin (MUCINEX FAST-MAX COLD & SINUS PO) Take 1 tablet by mouth every 4 (four) hours as needed.   Yes Historical Provider, MD  polyethylene glycol (MIRALAX / GLYCOLAX) packet Take 17 g by mouth daily as needed for moderate constipation.   Yes Historical Provider, MD  potassium chloride SA (K-DUR,KLOR-CON) 20 MEQ tablet Take 1  tablet (20 mEq total) by mouth daily. 09/23/15  Yes Oval Linsey, MD  venlafaxine XR (EFFEXOR-XR) 37.5 MG 24 hr capsule Take 1 capsule by mouth daily. 02/05/16  Yes Historical Provider, MD   BP 152/74 mmHg  Pulse 82  Temp(Src) 98.2 F (36.8 C) (Oral)  Resp 20  SpO2 100%  LMP 03/31/2013 Physical Exam  Constitutional: She is oriented to person, place, and time. She appears well-developed and well-nourished.  Non-toxic appearance. She does not have a sickly appearance. She does not appear ill.  HENT:  Head: Normocephalic and atraumatic.  Right Ear: Tympanic membrane and external ear normal.  Left Ear: Tympanic membrane and external ear normal.  Nose: Nose normal.  Mouth/Throat: Uvula is midline, oropharynx is clear and moist and mucous membranes are normal. No oropharyngeal exudate, posterior oropharyngeal edema or posterior oropharyngeal erythema.  Eyes: Conjunctivae are normal. Pupils are equal, round, and reactive to light.  Neck: Normal range of motion. Neck  supple.  Cardiovascular: Normal rate, regular rhythm and normal heart sounds.   No murmur heard. Pulmonary/Chest: Effort normal. No accessory muscle usage or stridor. No respiratory distress. She has no wheezes. She has no rhonchi. She has rales (that clear with coughing).  Oxygen saturation 100% on RA.  Patient speaking in clear full sentences without difficulty.   Abdominal: Soft. Bowel sounds are normal. She exhibits no distension. There is no tenderness. There is no rebound and no guarding.  Musculoskeletal: Normal range of motion.  Lymphadenopathy:    She has no cervical adenopathy.  Neurological: She is alert and oriented to person, place, and time.  Speech clear without dysarthria.  Skin: Skin is warm and dry.  Psychiatric: She has a normal mood and affect. Her behavior is normal.    ED Course  Procedures (including critical care time) Labs Review Labs Reviewed  I-STAT CHEM 8, ED  Randolm Idol, ED    Imaging  Review Dg Chest 2 View  02/29/2016  CLINICAL DATA:  Cough, fever, shortness of breath, and chills for 6 days. EXAM: CHEST  2 VIEW COMPARISON:  05/12/2015 FINDINGS: The heart size and mediastinal contours are within normal limits. Both lungs are clear. No evidence of pleural effusion. Lower thoracic spine degenerative changes again noted. IMPRESSION: No active cardiopulmonary disease. Electronically Signed   By: Earle Gell M.D.   On: 02/29/2016 10:18   I have personally reviewed and evaluated these images and lab results as part of my medical decision-making.   EKG Interpretation   Date/Time:  Saturday February 29 2016 10:09:38 EDT Ventricular Rate:  65 PR Interval:  123 QRS Duration: 95 QT Interval:  430 QTC Calculation: 447 R Axis:   26 Text Interpretation:  Sinus rhythm Consider left atrial enlargement Low  voltage, precordial leads Abnormal R-wave progression, early transition No  significant change since last tracing Confirmed by BEATON  MD, ROBERT  ([id number]) on 02/29/2016 11:14:24 AM      MDM   Final diagnoses:  Bronchitis   Patient presents with URI sxs and gradual onset worsening SOB last night.  VSS, NAD.  No evidence of hypoxia or RA.  Speaking in clear full sentences.  Heart RRR, lungs with rales in RLL that clear with coughing.  Abdomen soft and benign.  HENT exam unremarkable.  DDx include bronchitis vs URI vs PNA.  Doubt PE, low risk per [facility] critieria.  Will obtain CXR to evaluate for PNA.  Will obtain EKG as well. Patient given PO prednisone and breathing treatment with albuterol and atrovent.  Will reassess.   Patient reports improvement with breathing treatment.  EKG without acute changes.  Low suspsicion for ACS. CXR negative.  Suspect viral URI vs bronchitis.  She reports that she has also been feeling generally weak.  Will check i-stat chem 8 and troponin.   Patient able to ambulate without difficulty or evidence of hypoxia with oxygen saturation ranging between  96-100%.  labs without acute abnormalities. Plan to discharge home with albuterol and azithromycin. Discussed return precautions.  Follow up PCP.  Patient agrees and acknowledges the above plan for discharge.   Case has been discussed with Dr. Audie Pinto who agrees with the above plan for discharge.          Gloriann Loan, PA-C 02/29/16 1253  Leonard Schwartz, MD 03/05/16 [phone]

## 2016-02-29 NOTE — ED Notes (Signed)
Pt reports having recent cough and cold symptoms and now sob. Cough is productive with yellow sputum and reports fever.

## 2016-03-09 ENCOUNTER — Telehealth: Payer: Self-pay | Admitting: Internal Medicine

## 2016-03-09 NOTE — Telephone Encounter (Signed)
APPT. REMINDER CALL, LMTCB °

## 2016-03-11 ENCOUNTER — Encounter: Payer: Self-pay | Admitting: Internal Medicine

## 2016-03-11 ENCOUNTER — Ambulatory Visit (INDEPENDENT_AMBULATORY_CARE_PROVIDER_SITE_OTHER): Payer: Medicare Other | Admitting: Internal Medicine

## 2016-03-11 VITALS — BP 150/66 | HR 81 | Temp 98.3°F | Ht 69.0 in | Wt 321.2 lb

## 2016-03-11 DIAGNOSIS — I10 Essential (primary) hypertension: Secondary | ICD-10-CM | POA: Diagnosis not present

## 2016-03-11 DIAGNOSIS — B373 Candidiasis of vulva and vagina: Secondary | ICD-10-CM

## 2016-03-11 DIAGNOSIS — G5603 Carpal tunnel syndrome, bilateral upper limbs: Secondary | ICD-10-CM | POA: Diagnosis not present

## 2016-03-11 DIAGNOSIS — E785 Hyperlipidemia, unspecified: Secondary | ICD-10-CM | POA: Diagnosis not present

## 2016-03-11 DIAGNOSIS — L309 Dermatitis, unspecified: Secondary | ICD-10-CM

## 2016-03-11 DIAGNOSIS — B379 Candidiasis, unspecified: Secondary | ICD-10-CM | POA: Insufficient documentation

## 2016-03-11 DIAGNOSIS — J309 Allergic rhinitis, unspecified: Secondary | ICD-10-CM

## 2016-03-11 DIAGNOSIS — T3695XA Adverse effect of unspecified systemic antibiotic, initial encounter: Secondary | ICD-10-CM

## 2016-03-11 MED ORDER — ALBUTEROL SULFATE HFA 108 (90 BASE) MCG/ACT IN AERS: 2.0000 | INHALATION_SPRAY | RESPIRATORY_TRACT | Status: DC | PRN

## 2016-03-11 MED ORDER — FLUCONAZOLE 150 MG PO TABS
150.0000 mg | ORAL_TABLET | Freq: Every day | ORAL | Status: DC
Start: 2016-03-11 — End: 2016-10-29

## 2016-03-11 MED ORDER — FLUTICASONE PROPIONATE 50 MCG/ACT NA SUSP: 1.0000 | Freq: Every day | NASAL | Status: DC

## 2016-03-11 MED ORDER — HYDROCORTISONE 2.5 % EX LOTN: 1.0000 "application " | TOPICAL_LOTION | Freq: Two times a day (BID) | CUTANEOUS | Status: DC | PRN

## 2016-03-11 MED ORDER — BENZONATATE 100 MG PO CAPS: 100.0000 mg | ORAL_CAPSULE | Freq: Three times a day (TID) | ORAL | Status: DC | PRN

## 2016-03-11 NOTE — Progress Notes (Signed)
Subjective:   Patient ID: Amy Jarvis female   DOB: 1961-09-13 55 y.o.   MRN: [id number]  HPI: [name] is a 55 y.o. with past medical history as outlined below who presents to clinic for HTN f/u.   Please see problem list for status of the pt's chronic medical problems.  Past Medical History  Diagnosis Date  . Hyperlipidemia LDL goal < 130 09/25/2008  . Morbid obesity 08/28/2010  . Obstructive sleep apnea on CPAP 06/20/2010  . Hypertension 09/25/2008  . GERD 09/05/2009  . Diverticulosis 03/26/2010  . Microscopic hematuria 01/13/2010  . Cystocele 01/13/2010  . Uterine prolapse 11/19/2008  . Perimenopausal 01/13/2010  . Vertigo 10/07/2012  . Degenerative disk disease 10/07/2012  . Cervical polyp     possible history of cervical polyp  . Allergic rhinitis 09/23/2015  . Pneumonia     6/16  . Degenerative lumbar spinal stenosis   . DDD (degenerative disc disease), lumbar   . Carpal tunnel syndrome, bilateral    Current Outpatient Prescriptions  Medication Sig Dispense Refill  . acetaminophen (TYLENOL) 500 MG tablet Take 1,000 mg by mouth every 8 (eight) hours as needed for mild pain or moderate pain.    Marland Kitchen albuterol (PROVENTIL HFA;VENTOLIN HFA) 108 (90 Base) MCG/ACT inhaler Inhale 2 puffs into the lungs every 4 (four) hours as needed for wheezing or shortness of breath. 1 Inhaler 0  . amLODipine (NORVASC) 10 MG tablet Take 1 tablet (10 mg total) by mouth daily. 90 tablet 3  . Ascorbic Acid (VITAMIN C PO) Take 1 tablet by mouth daily.    Marland Kitchen azithromycin (ZITHROMAX) 250 MG tablet Take 1 tablet (250 mg total) by mouth daily. Take first 2 tablets together, then 1 every day until finished. 6 tablet 0  . diclofenac sodium (VOLTAREN) 1 % GEL Apply 4 g topically 4 (four) times daily. (Patient taking differently: Apply 4 g topically 4 (four) times daily as needed (for pain). ) 1 Tube 3  . esomeprazole (NEXIUM) 40 MG capsule Take 1 capsule (40 mg total) by mouth daily before  breakfast. 90 capsule 3  . fluticasone (FLONASE) 50 MCG/ACT nasal spray Place 1 spray into both nostrils daily. 16 g 3  . hydrocortisone 2.5 % lotion Apply 1 application topically 2 (two) times daily as needed.     Marland Kitchen ibuprofen (ADVIL,MOTRIN) 800 MG tablet Take 1 tablet (800 mg total) by mouth every 6 (six) hours as needed. 30 tablet 1  . loratadine (CLARITIN) 10 MG tablet Take 1 tablet (10 mg total) by mouth daily. 90 tablet 3  . losartan (COZAAR) 100 MG tablet take 1 tablet by mouth once daily 90 tablet 4  . Magnesium 250 MG TABS Take 250 mg by mouth daily.     . meclizine (ANTIVERT) 25 MG tablet Take 1 tablet (25 mg total) by mouth 3 (three) times daily as needed. (Patient taking differently: Take 25 mg by mouth 3 (three) times daily as needed for dizziness. ) 30 tablet 2  . mirabegron ER (MYRBETRIQ) 50 MG TB24 tablet Take 50 mg by mouth daily.    . Multiple Vitamin (MULTIVITAMIN WITH MINERALS) TABS Take 1 tablet by mouth daily.     . Omega-3 Fatty Acids (FISH OIL) 1000 MG CAPS Take 1,000 mg by mouth daily.     Marland Kitchen oxyCODONE-acetaminophen (PERCOCET/ROXICET) 5-325 MG tablet Take 1-2 tablets by mouth every 4 (four) hours as needed (moderate to severe pain (when tolerating fluids)). 30 tablet 0  . Phenylephrine-APAP-Guaifenesin [facility]  FAST-MAX COLD & SINUS PO) Take 1 tablet by mouth every 4 (four) hours as needed.    . polyethylene glycol (MIRALAX / GLYCOLAX) packet Take 17 g by mouth daily as needed for moderate constipation.    . potassium chloride SA (K-DUR,KLOR-CON) 20 MEQ tablet Take 1 tablet (20 mEq total) by mouth daily. 90 tablet 3  . venlafaxine XR (EFFEXOR-XR) 37.5 MG 24 hr capsule Take 1 capsule by mouth daily.  1   No current facility-administered medications for this visit.   Family History  Problem Relation Age of Onset  . Colon cancer Mother 19  . Hypertension Mother   . Stroke Mother 26  . Breast cancer Other     maternal great-aunt  . Cancer Other     maternal great aunt  with breast cancer   Social History   Social History  . Marital Status: Divorced    Spouse Name: N/A  . Number of Children: 3  . Years of Education: N/A   Occupational History  .     Social History Main Topics  . Smoking status: Never Smoker   . Smokeless tobacco: Never Used  . Alcohol Use: No  . Drug Use: No  . Sexual Activity: Not Currently   Other Topics Concern  . Not on file   Social History Narrative   Review of Systems: Review of Systems  Constitutional: Negative for fever and chills.  Respiratory: Negative for shortness of breath.   Cardiovascular: Negative for chest pain.  Gastrointestinal: Negative for nausea, vomiting and abdominal pain.  Genitourinary:       Cottage cheese discharge and vaginal itching    Neurological: Positive for dizziness (when standing up or moving head quickly). Negative for weakness.    Objective:  Physical Exam: Filed Vitals:   03/11/16 1536 03/11/16 1538  BP: 150/66   Pulse: 81   Temp: 98.3 F (36.8 C)   TempSrc: Oral   Height:  5\' 9"  (1.753 m)  Weight: 321 lb 3.2 oz (145.695 kg)   SpO2: 100%    Physical Exam  Constitutional: She appears well-developed and well-nourished. No distress.  HENT:  Head: Normocephalic and atraumatic.  Nose: Nose normal.  Eyes: Conjunctivae and EOM are normal. No scleral icterus.  Cardiovascular: Normal rate, regular rhythm and normal heart sounds.  Exam reveals no gallop and no friction rub.   No murmur heard. Pulmonary/Chest: Effort normal and breath sounds normal. No respiratory distress. She has no wheezes. She has no rales.  Abdominal: Soft. Bowel sounds are normal. She exhibits no distension and no mass. There is no tenderness. There is no rebound and no guarding.  Skin: Skin is warm and dry. No rash noted. She is not diaphoretic. No erythema. No pallor.    Assessment & Plan:   Please see problem based assessment and plan.

## 2016-03-16 ENCOUNTER — Telehealth: Payer: Self-pay

## 2016-03-16 MED ORDER — EZETIMIBE 10 MG PO TABS: 10.0000 mg | ORAL_TABLET | Freq: Every day | ORAL | Status: DC

## 2016-03-16 NOTE — Assessment & Plan Note (Signed)
Stable. Refilled flonase and albuterol inhaler. Requesting tessalon pearls for cough which was filled.

## 2016-03-16 NOTE — Telephone Encounter (Signed)
Pt calls and has questions from last visit with dr Hulen Luster: 1) cholesterol medication, she states that she was told a new med was going to be ordered and the pharmacy doesn't have a new script 2) pt was to have a neuro referral, i do not see one 3) pt was told about a hand splint to wear at night, she doesn't know anything about these and does not know how to precede

## 2016-03-16 NOTE — Telephone Encounter (Signed)
Pt requesting the nurse to call back. 

## 2016-03-16 NOTE — Addendum Note (Signed)
Addended by: Norman Herrlich on: 03/16/2016 04:18 PM   Modules accepted: Orders, SmartSet

## 2016-03-16 NOTE — Assessment & Plan Note (Signed)
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 Her LDL was 143 last year.   - rx for zetia 10mg  daily.

## 2016-03-16 NOTE — Assessment & Plan Note (Signed)
Pt called back after visit requesting rx for wrist splints. Apparently neurology referral was never made during last visit 10/2015 and she is still interested in seeing neurology.   - rx for wrist splints x2 and referral placed for neurology for NCS

## 2016-03-16 NOTE — Assessment & Plan Note (Signed)
Seen in the ED earlier this month and prescribed azithromycin for presumed viral bronchitis vs PNA. States she gets a yeast infxn with abx use. Having vaginal itching and cottage cheese dc.   - given rx for diflucan 150mg 

## 2016-03-16 NOTE — Assessment & Plan Note (Signed)
Stable. Refilled for hydrocortisone cream.

## 2016-03-16 NOTE — Assessment & Plan Note (Signed)
BP Readings from Last 3 Encounters:  03/11/16 150/66  02/29/16 160/89  12/27/15 128/52    Lab Results  Component Value Date   NA 143 02/29/2016   K 3.8 02/29/2016   CREATININE 0.60 02/29/2016    Assessment: Blood pressure control:  controlled Progress toward BP goal:   at goal Comments: on cozaar 100mg  and norvasc 10mg  Plan: Medications:  continue current medications Educational resources provided:   Self management tools provided:   Other plans: allowed for higher blood pressures due to sx of orthostatic hypotension. Can reassess next visit and add another BP med if needed.

## 2016-03-16 NOTE — Telephone Encounter (Signed)
Called pt back today at 4:18. Thanks.   Julious Oka, MD Internal Medicine Resident, PGY [facility] Pager: [phone]

## 2016-03-18 ENCOUNTER — Encounter: Payer: Self-pay | Admitting: Neurology

## 2016-03-18 ENCOUNTER — Ambulatory Visit (INDEPENDENT_AMBULATORY_CARE_PROVIDER_SITE_OTHER): Payer: Medicare Other | Admitting: Neurology

## 2016-03-18 VITALS — BP 120/78 | HR 77 | Ht 69.0 in | Wt 318.1 lb

## 2016-03-18 DIAGNOSIS — G5603 Carpal tunnel syndrome, bilateral upper limbs: Secondary | ICD-10-CM

## 2016-03-18 NOTE — Patient Instructions (Signed)
NCS/EMG of the upper extremities Start using wrist splint on the right side Return to clinic in 3 months

## 2016-03-18 NOTE — Progress Notes (Signed)
[facility] Clinic Note - Initial Visit   Date: 03/18/2016  [name] MRN: [id number] DOB: Apr 23, 1961   Dear Dr. Hulen Luster:  Thank you for your kind referral of Amy Jarvis for consultation of carpal tunnel syndrome. Although her history is well known to you, please allow [name] to reiterate it for the purpose of our medical record. The patient was accompanied to the clinic by self.    History of Present Illness: Amy Jarvis is a 55 y.o. right-handed [place] American female with hyperlipidemia, hypertension, GERD, OSA on CPAP, and degenerative disc disease of the spine presenting for evaluation of bilateral hand paresthesias.    Starting around September 2016, she began experiencing weakness of the hands and numbness of all the fingers, except for the pinky finger.  Symptoms have been constant since onset and worse at nighttime, because she feels tingling pain into her elbows. She has began resting her arms on pillows at night time for greater comfort.  She has difficulty with fine finger movements such as putting on jewelry, writing, grasping objects, and buttoning.  She saw her PCP in December who recommended using wrist splints, but she has not used them.   She denies any repetitive activity of the hands or neck pain.  No history of diabetes or thyroid disease.  Out-side paper records, electronic medical record, and images have been reviewed where available and summarized as:  Lab Results  Component Value Date   HGBA1C 5.6 11/16/2013   Lab Results  Component Value Date   TSH 1.857 05/22/2015   Lab Results  Component Value Date   VITAMINB12 484 05/22/2015   MRI lumbar spine 05/03/2007: 1. Multilevel disk bulging at L2-3, 3-4, 4-5, and 5-1.  2. Grade I anterolisthesis of L4 on 5.  3. Right greater than left lateral recess and foraminal narrowing L4-5.  4. Biforaminal narrowing L5-S1.  5. Far left lateral disk protrusion L2-3  contacts the exiting L2 nerve root.  6. Mild biforaminal narrowing L3-4.  MRI/A brain 05/12/2002:  Negative  Past Medical History  Diagnosis Date  . Hyperlipidemia LDL goal < 130 09/25/2008  . Morbid obesity 08/28/2010  . Obstructive sleep apnea on CPAP 06/20/2010  . Hypertension 09/25/2008  . GERD 09/05/2009  . Diverticulosis 03/26/2010  . Microscopic hematuria 01/13/2010  . Cystocele 01/13/2010  . Uterine prolapse 11/19/2008  . Perimenopausal 01/13/2010  . Vertigo 10/07/2012  . Degenerative disk disease 10/07/2012  . Cervical polyp     possible history of cervical polyp  . Allergic rhinitis 09/23/2015  . Pneumonia     6/16  . Degenerative lumbar spinal stenosis   . DDD (degenerative disc disease), lumbar   . Carpal tunnel syndrome, bilateral     Past Surgical History  Procedure Laterality Date  . Cholecystectomy  2010  . Rotator cuff repair      left  . Tubal ligation  1992  . Hysteroscopy w/d&c N/A 01/02/2014    Procedure: DILATATION AND CURETTAGE /HYSTEROSCOPY;  Surgeon: Linda Hedges, DO;  Location: [facility];  Service: Gynecology;  Laterality: N/A;  . Colonoscopy    . Laparoscopic vaginal hysterectomy with salpingo oophorectomy Bilateral 12/26/2015    Procedure: LAPAROSCOPIC ASSISTED VAGINAL HYSTERECTOMY WITH SALPINGO OOPHORECTOMY;  Surgeon: Linda Hedges, DO;  Location: [facility];  Service: Gynecology;  Laterality: Bilateral;  . Rectocele repair N/A 12/26/2015    Procedure: POSTERIOR REPAIR (RECTOCELE);  Surgeon: Linda Hedges, DO;  Location: [facility];  Service: Gynecology;  Laterality: N/A;  . Cystoscopy  N/A 12/26/2015    Procedure: CYSTOSCOPY;  Surgeon: Linda Hedges, DO;  Location: [facility];  Service: Gynecology;  Laterality: N/A;     Medications:  Outpatient Encounter Prescriptions as of 03/18/2016  Medication Sig Note  . acetaminophen (TYLENOL) 500 MG tablet Take 1,000 mg by mouth every 8 (eight) hours as needed for mild pain or moderate pain.   Marland Kitchen albuterol (PROVENTIL HFA;VENTOLIN  HFA) 108 (90 Base) MCG/ACT inhaler Inhale 2 puffs into the lungs every 4 (four) hours as needed for wheezing or shortness of breath.   Marland Kitchen amLODipine (NORVASC) 10 MG tablet Take 1 tablet (10 mg total) by mouth daily.   . Ascorbic Acid (VITAMIN C PO) Take 1 tablet by mouth daily.   . diclofenac sodium (VOLTAREN) 1 % GEL Apply 4 g topically 4 (four) times daily. (Patient taking differently: Apply 4 g topically 4 (four) times daily as needed (for pain). )   . esomeprazole (NEXIUM) 40 MG capsule Take 1 capsule (40 mg total) by mouth daily before breakfast.   . ezetimibe (ZETIA) 10 MG tablet Take 1 tablet (10 mg total) by mouth daily.   . fluconazole (DIFLUCAN) 150 MG tablet Take 1 tablet (150 mg total) by mouth daily.   . fluticasone (FLONASE) 50 MCG/ACT nasal spray Place 1 spray into both nostrils daily.   . hydrocortisone 2.5 % lotion Apply 1 application topically 2 (two) times daily as needed.   Marland Kitchen ibuprofen (ADVIL,MOTRIN) 800 MG tablet Take 1 tablet (800 mg total) by mouth every 6 (six) hours as needed.   . loratadine (CLARITIN) 10 MG tablet Take 1 tablet (10 mg total) by mouth daily.   Marland Kitchen losartan (COZAAR) 100 MG tablet take 1 tablet by mouth once daily   . meclizine (ANTIVERT) 25 MG tablet Take 1 tablet (25 mg total) by mouth 3 (three) times daily as needed. (Patient taking differently: Take 25 mg by mouth 3 (three) times daily as needed for dizziness. )   . mirabegron ER (MYRBETRIQ) 50 MG TB24 tablet Take 50 mg by mouth daily.   . Multiple Vitamin (MULTIVITAMIN WITH MINERALS) TABS Take 1 tablet by mouth daily.    . Omega-3 Fatty Acids (FISH OIL) 1000 MG CAPS Take 1,000 mg by mouth daily.    . polyethylene glycol (MIRALAX / GLYCOLAX) packet Take 17 g by mouth daily as needed for moderate constipation.   . potassium chloride SA (K-DUR,KLOR-CON) 20 MEQ tablet Take 1 tablet (20 mEq total) by mouth daily.   Marland Kitchen venlafaxine XR (EFFEXOR-XR) 37.5 MG 24 hr capsule Take 1 capsule by mouth daily.   Marland Kitchen  venlafaxine XR (EFFEXOR-XR) 75 MG 24 hr capsule Take by mouth daily. 03/18/2016: Received from: External Pharmacy  . [DISCONTINUED] benzonatate (TESSALON PERLES) 100 MG capsule Take 1 capsule (100 mg total) by mouth 3 (three) times daily as needed for cough.   . [DISCONTINUED] Phenylephrine-APAP-Guaifenesin (MUCINEX FAST-MAX COLD & SINUS PO) Take 1 tablet by mouth every 4 (four) hours as needed.    No facility-administered encounter medications on file as of 03/18/2016.     Allergies:  Allergies  Allergen Reactions  . Ace Inhibitors Anaphylaxis, Itching and Swelling  . Promethazine Hcl Itching, Swelling, Rash and Other (See Comments)    Pt states that this medication causes her lips to swell.      Family History: Family History  Problem Relation Age of Onset  . Colon cancer Mother 25  . Hypertension Mother   . Stroke Mother 32  . Breast cancer Other  maternal great-aunt  . Cancer Other     maternal great aunt with breast cancer    Social History: Social History  Substance Use Topics  . Smoking status: Never Smoker   . Smokeless tobacco: Never Used  . Alcohol Use: No   Social History   Social History Narrative   Lives alone in a one story home.  Has 3 children.  On disability.  Previously a Psychologist, counselling.  Education: some college.    Review of Systems:  CONSTITUTIONAL: No fevers, chills, night sweats, or weight loss.   EYES: No visual changes or eye pain ENT: No hearing changes.  No history of nose bleeds.   RESPIRATORY: No cough, wheezing and shortness of breath.   CARDIOVASCULAR: Negative for chest pain, and palpitations.   GI: Negative for abdominal discomfort, blood in stools or black stools.  No recent change in bowel habits.   GU:  No history of incontinence.   MUSCLOSKELETAL: +history of joint pain +swelling.  No myalgias.   SKIN: Negative for lesions, rash, and itching.   HEMATOLOGY/ONCOLOGY: Negative for prolonged bleeding, bruising easily, and swollen  nodes.  No history of cancer.   ENDOCRINE: Negative for cold or heat intolerance, polydipsia or goiter.   PSYCH:  No depression or anxiety symptoms.   NEURO: As Above.   Vital Signs:  BP 120/78 mmHg  Pulse 77  Ht 5\' 9"  (1.753 m)  Wt 318 lb 1 oz (144.272 kg)  BMI 46.95 kg/m2  SpO2 98%  LMP 03/31/2013   General Medical Exam:   General:  Well appearing, comfortable.   Eyes/ENT: see cranial nerve examination.   Neck: No masses appreciated.  Full range of motion without tenderness.  No carotid bruits. Respiratory:  Clear to auscultation, good air entry bilaterally.   Cardiac:  Regular rate and rhythm, no murmur.   Extremities:  No deformities, edema, or skin discoloration.  Skin:  No rashes or lesions.  Neurological Exam: MENTAL STATUS including orientation to time, place, person, recent and remote memory, attention span and concentration, language, and fund of knowledge is normal.  Speech is not dysarthric.  CRANIAL NERVES: II:  No visual field defects.  Unremarkable fundi.   III-IV-VI: Pupils equal round and reactive to light.  Normal conjugate, extra-ocular eye movements in all directions of gaze.  No nystagmus.  No ptosis.   V:  Normal facial sensation.     VII:  Normal facial symmetry and movements.   VIII:  Normal hearing and vestibular function.   IX-X:  Normal palatal movement.   XI:  Normal shoulder shrug and head rotation.   XII:  Normal tongue strength and range of motion, no deviation or fasciculation.  MOTOR:  No atrophy, fasciculations or abnormal movements.  No pronator drift.  Tone is normal.    Right Upper Extremity:    Left Upper Extremity:    Deltoid  5/5   Deltoid  5/5   Biceps  5/5   Biceps  5/5   Triceps  5/5   Triceps  5/5   Wrist extensors  5/5   Wrist extensors  5/5   Wrist flexors  5/5   Wrist flexors  5/5   Finger extensors  5/5   Finger extensors  5/5   Finger flexors  5/5   Finger flexors  5/5   Dorsal interossei  5/5   Dorsal interossei  5/5     Abductor pollicis  4/5   Abductor pollicis  5/5   Tone (Ashworth scale)  0  Tone (Ashworth scale)  0   Right Lower Extremity:    Left Lower Extremity:    Hip flexors  5/5   Hip flexors  5/5   Hip extensors  5/5   Hip extensors  5/5   Knee flexors  5/5   Knee flexors  5/5   Knee extensors  5/5   Knee extensors  5/5   Dorsiflexors  5/5   Dorsiflexors  5/5   Plantarflexors  5/5   Plantarflexors  5/5   Toe extensors  5/5   Toe extensors  5/5   Toe flexors  5/5   Toe flexors  5/5   Tone (Ashworth scale)  0  Tone (Ashworth scale)  0   MSRs:  Right                                                                 Left brachioradialis 2+  brachioradialis 2+  biceps 2+  biceps 2+  triceps 2+  triceps 2+  patellar 2+  patellar 2+  ankle jerk 2+  ankle jerk 2+  Hoffman no  Hoffman no  plantar response down  plantar response down   SENSORY:  Hyperesthesia over the right median distribution to pin prick.  Sensation to all modalities intact in the other extremities.  Tinel's sign is positive on the right.  COORDINATION/GAIT: Normal finger-to- nose-finger and heel-to-shin.  Intact rapid alternating movements bilaterally.  Able to rise from a chair without using arms.  Gait wide-based and stable.    IMPRESSION: Ms. Halley is a 55 year-old female referred for evaluation of bilateral hand paresthesias.  Her exam shows motor and sensory deficits over the median nerve distribution on the right, most consistent with carpal tunnel syndrome.  Neurological exam of the left upper extremity is normal, but she may have mild CTS based on her history. She also complains of forearm pain and paresthesias, which raises the possibility of ulnar neuropathy.  To better localize her symptoms, NCS/EMG of the upper extremities will be performed.  Gabapentin for her painful paresthesias was declined.  She was strongly recommended to start using a wrist splint, especially on the right.  Further recommendations will be based  on the results of her EDX.   Return to clinic in 3 months    The duration of this appointment visit was 45 minutes of face-to-face time with the patient.  Greater than 50% of this time was spent in counseling, explanation of diagnosis, planning of further management, and coordination of care.   Thank you for allowing me to participate in patient's care.  If I can answer any additional questions, I would be pleased to do so.    Sincerely,    Donika K. Posey Pronto, DO

## 2016-03-25 NOTE — Progress Notes (Signed)
Internal Medicine Clinic Attending  Case discussed with Dr. Truong at the time of the visit.  We reviewed the resident's history and exam and pertinent patient test results.  I agree with the assessment, diagnosis, and plan of care documented in the resident's note.  

## 2016-03-31 ENCOUNTER — Telehealth: Payer: Self-pay | Admitting: Neurology

## 2016-03-31 ENCOUNTER — Ambulatory Visit (INDEPENDENT_AMBULATORY_CARE_PROVIDER_SITE_OTHER): Payer: Medicare Other | Admitting: Neurology

## 2016-03-31 DIAGNOSIS — G5603 Carpal tunnel syndrome, bilateral upper limbs: Secondary | ICD-10-CM

## 2016-03-31 NOTE — Telephone Encounter (Signed)
Results of EMG discussed with patient which shows very severe carpal tunnel syndrome on the right and severe on the left. Recommend orthopedic referral for CTS release, however she would like to try using the wrist splint as this has been helping over the past 2 weeks. If symptoms worsen, low threshold for referral.  Amy K. Posey Pronto, DO

## 2016-03-31 NOTE — Procedures (Addendum)
[facility]  [address]  [place], [place] [address] Tel: [phone] Fax:  [phone] Test Date:  03/31/2016  Patient: Amy Jarvis DOB: 05-Jun-1961 Physician: Narda Amber, DO  Sex: Female Height: 5\' 7"  Ref Phys: Narda Amber, DO  ID#: [id number] Temp: 35.9C Technician: Jerilynn Mages. Dean   Patient Complaints: This is a 55 year old female referred for evaluation of bilateral hand numbness and tingling.  NCV & EMG Findings: Extensive electrodiagnostic testing of the right upper extremity and additional studies of the left shows: 1. Bilateral median sensory responses are absent. Bilateral ulnar sensory responses are within normal limits. 2. Right median motor response shows markedly reduced amplitude and prolonged latency.  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 Bilateral ulnar motor responses are within normal limits. 3. Chronic motor axon loss changes are seen affecting the C8 myotomes on the right. No motor unit recruitment is seen in the right abductor pollicis brevis muscle, however there is evidence of active ongoing denervation.  These findings are not present in the left upper extremity.  Impression: 1. 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 2. Severe left median neuropathy at or distal to the wrist, with the clinical diagnosis of carpal tunnel syndrome. 3. Chronic C8 radiculopathy affecting the right upper extremity, mild in degree electrically. 4. 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   ___________________________ Narda Amber, DO    Nerve Conduction Studies Anti Sensory Summary Table   Site NR Peak (ms) Norm Peak (ms) P-T Amp (V) Norm P-T Amp  Left Median Anti  Sensory (2nd Digit)  35.9C  Wrist NR  <3.6  >15  Right Median Anti Sensory (2nd Digit)  35.9C  Wrist NR  <3.6  >15  Left Ulnar Anti Sensory (5th Digit)  35.9C  Wrist    2.9 <3.1 33.8 >10  Right Ulnar Anti Sensory (5th Digit)  35.9C  Wrist    2.9 <3.1 35.9 >10   Motor Summary Table   Site NR Onset (ms) Norm Onset (ms) O-P Amp (mV) Norm O-P Amp Site1 Site2 Delta-0 (ms) Dist (cm) Vel (m/s) Norm Vel (m/s)  Left Median Motor (Abd Poll Brev)  35.9C  Wrist    6.3 <4.0 7.8 >6 Elbow Wrist 3.9 23.0 59 >50  Elbow    10.2  7.1         Right Median Motor (Abd Poll Brev)  35.9C  Wrist    8.4 <4.0 0.3 >6 Elbow Wrist 4.7 25.0 53 >50  Elbow    13.1  0.1  Ulnar-wrist cross-over Elbow 9.3 0.0    Ulnar-wrist cross-over    3.8  1.0         Left Ulnar Motor (Abd Dig Minimi)  35.9C  Wrist    2.3 <3.1 11.1 >7 B Elbow Wrist 3.6 21.0 58 >50  B Elbow    5.9  11.0  A Elbow B Elbow 1.5 10.0 67 >50  A Elbow    7.4  10.6         Right Ulnar Motor (Abd Dig Minimi)  35.9C  Wrist    2.5 <3.1 15.3 >7 B Elbow Wrist 3.8 22.0 58 >50  B Elbow    6.3  14.2  A Elbow B Elbow 1.6 10.0 62 >50  A Elbow    7.9  13.2          EMG   Side Muscle Ins Act Fibs Psw Fasc Number Recrt Dur Dur. Amp Amp. Poly Poly. Comment  Right 1stDorInt Nml Nml Nml Nml 1- Rapid Few 1+ Few 1+ Nml Nml N/A  Right Ext Indicis Nml Nml Nml Nml 1- Mod-R Few 1+ Few 1+ Nml Nml N/A  Right Abd Poll Brev Nml 1+ Nml Nml None None - - - - - - N/A  Right PronatorTeres Nml Nml Nml Nml Nml Nml Nml Nml Nml Nml Nml Nml N/A  Right Biceps Nml Nml Nml Nml Nml Nml Nml Nml Nml Nml Nml Nml N/A  Right Deltoid Nml Nml Nml Nml Nml Nml Nml Nml Nml Nml Nml Nml N/A  Right Triceps Nml Nml Nml Nml 1- Mod-R Few 1+ Few 1+ Nml Nml N/A  Left 1stDorInt Nml Nml Nml Nml Nml Nml Nml Nml Nml Nml Nml Nml N/A  Left Abd Poll Brev Nml Nml Nml Nml Nml Nml Nml Nml Nml Nml Nml Nml N/A  Left Ext Indicis Nml Nml Nml Nml Nml Nml Nml Nml Nml Nml Nml Nml N/A  Left PronatorTeres Nml Nml Nml  Nml Nml Nml Nml Nml Nml Nml Nml Nml N/A  Left Biceps Nml Nml Nml Nml Nml Nml Nml Nml Nml Nml Nml Nml N/A  Left Triceps Nml Nml Nml Nml Nml Nml Nml Nml Nml Nml Nml Nml N/A  Left Deltoid Nml Nml Nml Nml Nml Nml Nml Nml Nml Nml Nml Nml N/A      Waveforms:

## 2016-04-13 ENCOUNTER — Telehealth: Payer: Self-pay | Admitting: Neurology

## 2016-04-13 ENCOUNTER — Other Ambulatory Visit: Payer: Self-pay | Admitting: *Deleted

## 2016-04-13 MED ORDER — GABAPENTIN 300 MG PO CAPS: 300.0000 mg | ORAL_CAPSULE | Freq: Every day | ORAL | Status: DC

## 2016-04-13 NOTE — Telephone Encounter (Signed)
Discussed with Dr. Posey Pronto.  Will send in Rx for gabapentin 300 mg qhs.  #30 with 3 refills.

## 2016-04-13 NOTE — Telephone Encounter (Signed)
PT called in regards to the results of her EMG and pain medication/Dawn CB# [phone]

## 2016-04-13 NOTE — Telephone Encounter (Signed)
Patient has decided to try the gabapentin.  Please advise on strength and how to take.  Thanks.

## 2016-04-14 NOTE — Telephone Encounter (Signed)
Agreed -

## 2016-05-18 ENCOUNTER — Other Ambulatory Visit: Payer: Self-pay

## 2016-05-18 NOTE — Telephone Encounter (Signed)
Pt requesting Zetia to be filled for 90 days supply @ [facility] on [address].

## 2016-05-20 MED ORDER — EZETIMIBE 10 MG PO TABS: 10.0000 mg | ORAL_TABLET | Freq: Every day | ORAL | Status: DC

## 2016-06-05 ENCOUNTER — Other Ambulatory Visit: Payer: Self-pay | Admitting: *Deleted

## 2016-06-08 MED ORDER — ACETAMINOPHEN 500 MG PO TABS: 1000.0000 mg | ORAL_TABLET | Freq: Three times a day (TID) | ORAL | Status: DC | PRN

## 2016-06-25 ENCOUNTER — Ambulatory Visit: Payer: Medicare Other | Admitting: Neurology

## 2016-07-31 ENCOUNTER — Other Ambulatory Visit: Payer: Self-pay | Admitting: Internal Medicine

## 2016-07-31 DIAGNOSIS — R42 Dizziness and giddiness: Secondary | ICD-10-CM

## 2016-08-20 ENCOUNTER — Other Ambulatory Visit: Payer: Self-pay | Admitting: Internal Medicine

## 2016-08-20 DIAGNOSIS — K219 Gastro-esophageal reflux disease without esophagitis: Secondary | ICD-10-CM

## 2016-09-17 ENCOUNTER — Other Ambulatory Visit: Payer: Self-pay | Admitting: Internal Medicine

## 2016-09-17 DIAGNOSIS — Z23 Encounter for immunization: Secondary | ICD-10-CM | POA: Diagnosis not present

## 2016-09-17 DIAGNOSIS — I10 Essential (primary) hypertension: Secondary | ICD-10-CM

## 2016-09-23 ENCOUNTER — Ambulatory Visit (INDEPENDENT_AMBULATORY_CARE_PROVIDER_SITE_OTHER): Payer: Medicare Other | Admitting: Internal Medicine

## 2016-09-23 VITALS — BP 137/65 | HR 88 | Temp 98.0°F | Ht 69.0 in | Wt 324.4 lb

## 2016-09-23 DIAGNOSIS — M79671 Pain in right foot: Secondary | ICD-10-CM | POA: Diagnosis not present

## 2016-09-23 DIAGNOSIS — M722 Plantar fascial fibromatosis: Secondary | ICD-10-CM | POA: Insufficient documentation

## 2016-09-23 DIAGNOSIS — R600 Localized edema: Secondary | ICD-10-CM

## 2016-09-23 HISTORY — DX: Plantar fascial fibromatosis: M72.2

## 2016-09-23 NOTE — Progress Notes (Signed)
   CC: Right foot pain  HPI:  Amy Jarvis is a 55 y.o. woman with PMH as listed below who presents for management of right foot pain and chronic lower extremity edema.  Plantar fasciitis: Patient with 3.5 weeks of throbbing/aching right foot pain at the arch of her plantar foot from the ball of the foot to the heel. Pain is intermittent, worse with weight bearing or walking. She noticed the pain the morning after going for a walk in the park. She has occasional numbness in her heel. She has tried Tylenol ES and Ibuprofen with some relief. There was no trauma or injury at the site.  Chronic LE edema: Patient feels her swelling is somewhat worsened since taking Mirabegron as she is now retaining urine. She was previously on HCTZ for HTN which was discontinued 1 year ago due to urinary frequency.    Past Medical History:  Diagnosis Date  . Allergic rhinitis 09/23/2015  . Carpal tunnel syndrome, bilateral   . Cervical polyp    possible history of cervical polyp  . Cystocele 01/13/2010  . DDD (degenerative disc disease), lumbar   . Degenerative disk disease 10/07/2012  . Degenerative lumbar spinal stenosis   . Diverticulosis 03/26/2010  . GERD 09/05/2009  . Hyperlipidemia LDL goal < 130 09/25/2008  . Hypertension 09/25/2008  . Microscopic hematuria 01/13/2010  . Morbid obesity 08/28/2010  . Obstructive sleep apnea on CPAP 06/20/2010  . Perimenopausal 01/13/2010  . Pneumonia    6/16  . Uterine prolapse 11/19/2008  . Vertigo 10/07/2012    Review of Systems:   Review of Systems  Cardiovascular: Positive for leg swelling.       Chronic leg edema  Musculoskeletal:       Right foot arch pain. Chronic back pain.  Neurological: Negative for tingling.       Numbness right heel     Physical Exam:  Vitals:   09/23/16 1031  BP: 137/65  Pulse: 88  Temp: 98 F (36.7 C)  TempSrc: Oral  SpO2: 100%  Weight: (!) 324 lb 6.4 oz (147.1 kg)  Height: 5\' 9"  (1.753 m)   Physical Exam   Constitutional: She appears well-developed and well-nourished. No distress.  Pleasant obese woman  Musculoskeletal: Normal range of motion. She exhibits edema.  ROM of bilateral feet at ankles intact. Mild tenderness with palpation at the plantar surface of the right foot at the middle of the arch. Sensation intact. Non-pitting edema of bilateral feet and lower extremities.    Assessment & Plan:   See Encounters Tab for problem based charting.  Patient discussed with Dr. Evette Doffing

## 2016-09-23 NOTE — Patient Instructions (Addendum)
It was a pleasure to meet you [name].  Your right foot pain is likely from plantar fasciitis. Please try the stretching exercises we discussed. Continue the Ibuprofen to help reduce inflammation and activity as tolerated.  If your right foot pain worsens or does not improve over the next few weeks, we may need to consider referring you to a foot doctor for shoe inserts.   For your swelling and fluid retention, please try holding the Myrbetriq and schedule voiding times to see if your urinary urgency improves. I would like to hold off on changing your blood pressure medications for now as your blood pressure is well-controlled.  Please follow up with Dr. Hulen Luster on November 30th, 2017.      Plantar Fasciitis Plantar fasciitis is a painful foot condition that affects the heel. It occurs when the band of tissue that connects the toes to the heel bone (plantar fascia) becomes irritated. This can happen after exercising too much or doing other repetitive activities (overuse injury). The pain from plantar fasciitis can range from mild irritation to severe pain that makes it difficult for you to walk or move. The pain is usually worse in the morning or after you have been sitting or lying down for a while. CAUSES This condition may be caused by:  Standing for long periods of time.  Wearing shoes that do not fit.  Doing high-impact activities, including running, aerobics, and ballet.  Being overweight.  Having an abnormal way of walking (gait).  Having tight calf muscles.  Having high arches in your feet.  Starting a new athletic activity. SYMPTOMS The main symptom of this condition is heel pain. Other symptoms include:  Pain that gets worse after activity or exercise.  Pain that is worse in the morning or after resting.  Pain that goes away after you walk for a few minutes. DIAGNOSIS This condition may be diagnosed based on your signs and symptoms. Your health care provider  will also do a physical exam to check for:  A tender area on the bottom of your foot.  A high arch in your foot.  Pain when you move your foot.  Difficulty moving your foot. You may also need to have imaging studies to confirm the diagnosis. These can include:  X-rays.  Ultrasound.  MRI. TREATMENT  Treatment for plantar fasciitis depends on the severity of the condition. Your treatment may include:  Rest, ice, and over-the-counter pain medicines to manage your pain.  Exercises to stretch your calves and your plantar fascia.  A splint that holds your foot in a stretched, upward position while you sleep (night splint).  Physical therapy to relieve symptoms and prevent problems in the future.  Cortisone injections to relieve severe pain.  Extracorporeal shock wave therapy (ESWT) to stimulate damaged plantar fascia with electrical impulses. It is often used as a last resort before surgery.  Surgery, if other treatments have not worked after 12 months. HOME CARE INSTRUCTIONS  Take medicines only as directed by your health care provider.  Avoid activities that cause pain.  Roll the bottom of your foot over a bag of ice or a bottle of cold water. Do this for 20 minutes, 3-4 times a day.  Perform simple stretches as directed by your health care provider.  Try wearing athletic shoes with air-sole or gel-sole cushions or soft shoe inserts.  Wear a night splint while sleeping, if directed by your health care provider.  Keep all follow-up appointments with your health care  provider. PREVENTION   Do not perform exercises or activities that cause heel pain.  Consider finding low-impact activities if you continue to have problems.  Lose weight if you need to. The best way to prevent plantar fasciitis is to avoid the activities that aggravate your plantar fascia. SEEK MEDICAL CARE IF:  Your symptoms do not go away after treatment with home care measures.  Your pain gets  worse.  Your pain affects your ability to move or do your daily activities.   This information is not intended to replace advice given to you by your health care provider. Make sure you discuss any questions you have with your health care provider.   Document Released: 08/11/2001 Document Revised: 08/07/2015 Document Reviewed: 09/26/2014 Elsevier Interactive Patient Education [employer].

## 2016-09-24 NOTE — Assessment & Plan Note (Signed)
Patient feels her swelling is somewhat worsened since taking Mirabegron as she is now retaining urine. She was previously on HCTZ for HTN which was discontinued 1 year ago due to urinary frequency.  Advised patient to discontinue Mirabegron and start scheduled voiding to decrease urgency. She will follow up with PCP to discuss whether restarting HCTZ is appropriate.

## 2016-09-24 NOTE — Assessment & Plan Note (Signed)
Patient with 3.5 weeks of throbbing/aching right foot pain at the arch of her plantar foot from the ball of the foot to the heel. Pain is intermittent, worse with weight bearing or walking. She noticed the pain the morning after going for a walk in the park. She has occasional numbness in her heel. She has tried Tylenol ES and Ibuprofen with some relief. There was no trauma or injury at the site.  Symptoms consistent with plantar fasciitis. No injury, trauma, severe pain to suggest fracture. Advised patient on stretching exercises, Ibuprofen as needed, and activity as tolerated. -Continue ibuprofen as needed -Demonstrated stretching exercises for home use -Continue activity as tolerated -Consider podiatry referral if no improvement for orthotics

## 2016-09-25 NOTE — Progress Notes (Signed)
Internal Medicine Clinic Attending  Case discussed with Dr. Patel,Vishal at the time of the visit.  We reviewed the resident's history and exam and pertinent patient test results.  I agree with the assessment, diagnosis, and plan of care documented in the resident's note.  

## 2016-09-29 ENCOUNTER — Other Ambulatory Visit: Payer: Self-pay | Admitting: Internal Medicine

## 2016-09-29 DIAGNOSIS — J309 Allergic rhinitis, unspecified: Secondary | ICD-10-CM

## 2016-10-01 DIAGNOSIS — M25571 Pain in right ankle and joints of right foot: Secondary | ICD-10-CM | POA: Diagnosis not present

## 2016-10-26 ENCOUNTER — Other Ambulatory Visit: Payer: Self-pay | Admitting: Internal Medicine

## 2016-10-26 DIAGNOSIS — I10 Essential (primary) hypertension: Secondary | ICD-10-CM

## 2016-10-29 ENCOUNTER — Encounter: Payer: Self-pay | Admitting: Internal Medicine

## 2016-10-29 ENCOUNTER — Encounter: Payer: Self-pay | Admitting: Gastroenterology

## 2016-10-29 ENCOUNTER — Ambulatory Visit (INDEPENDENT_AMBULATORY_CARE_PROVIDER_SITE_OTHER): Payer: Medicare Other | Admitting: Internal Medicine

## 2016-10-29 VITALS — BP 132/75 | HR 77 | Temp 98.2°F | Wt 324.9 lb

## 2016-10-29 DIAGNOSIS — I10 Essential (primary) hypertension: Secondary | ICD-10-CM | POA: Diagnosis not present

## 2016-10-29 DIAGNOSIS — L309 Dermatitis, unspecified: Secondary | ICD-10-CM

## 2016-10-29 DIAGNOSIS — Z0000 Encounter for general adult medical examination without abnormal findings: Secondary | ICD-10-CM

## 2016-10-29 DIAGNOSIS — E785 Hyperlipidemia, unspecified: Secondary | ICD-10-CM

## 2016-10-29 DIAGNOSIS — Z79899 Other long term (current) drug therapy: Secondary | ICD-10-CM

## 2016-10-29 DIAGNOSIS — M722 Plantar fascial fibromatosis: Secondary | ICD-10-CM

## 2016-10-29 MED ORDER — IBUPROFEN 800 MG PO TABS: 800.0000 mg | ORAL_TABLET | Freq: Four times a day (QID) | ORAL | 1 refills | Status: DC | PRN

## 2016-10-29 MED ORDER — HYDROCORTISONE 2.5 % EX LOTN: 1.0000 "application " | TOPICAL_LOTION | Freq: Two times a day (BID) | CUTANEOUS | 3 refills | Status: DC | PRN

## 2016-10-29 MED ORDER — CYCLOBENZAPRINE HCL 10 MG PO TABS: 10.0000 mg | ORAL_TABLET | Freq: Two times a day (BID) | ORAL | 3 refills | Status: DC | PRN

## 2016-10-29 NOTE — Assessment & Plan Note (Signed)
Pt is still having foot pain from plantar fascitis. She has seen ortho for this and was told that in addition to plantar fascitis of her foot she has arthritis and a heel spur. She has been taking ibuprofen for foot pain. She tried her daughters flexeril which helped with pain.   - rx for flexeril BID prn - instructed to continue foot stretches.

## 2016-10-30 LAB — LIPID PANEL
CHOL/HDL RATIO: 4 ratio (ref 0.0–4.4)
CHOLESTEROL TOTAL: 207 mg/dL — AB (ref 100–199)
HDL: 52 mg/dL (ref >39)
LDL CALC: 135 mg/dL — AB (ref 0–99)
TRIGLYCERIDES: 101 mg/dL (ref 0–149)
VLDL Cholesterol Cal: 20 mg/dL (ref 5–40)

## 2016-10-30 NOTE — Progress Notes (Signed)
   CC: Plantar fasciitis and hypertension  HPI:  Amy Jarvis is a 55 y.o. with past medical history as outlined below who presents to clinic for plantar fasciitis and hypertension follow-up. Please see problem list for further details of her chronic medical issues.  Past Medical History:  Diagnosis Date  . Allergic rhinitis 09/23/2015  . Carpal tunnel syndrome, bilateral   . Cervical polyp    possible history of cervical polyp  . Cystocele 01/13/2010  . DDD (degenerative disc disease), lumbar   . Degenerative disk disease 10/07/2012  . Degenerative lumbar spinal stenosis   . Diverticulosis 03/26/2010  . GERD 09/05/2009  . Hyperlipidemia LDL goal < 130 09/25/2008  . Hypertension 09/25/2008  . Microscopic hematuria 01/13/2010  . Morbid obesity 08/28/2010  . Obstructive sleep apnea on CPAP 06/20/2010  . Perimenopausal 01/13/2010  . Pneumonia    6/16  . Uterine prolapse 11/19/2008  . Vertigo 10/07/2012    Review of Systems:  Positive for leg swelling and pain. Negative for blood in her stools, chest pain, abdominal pain.  Physical Exam:  Vitals:   10/29/16 1349  BP: 132/75  Pulse: 77  Temp: 98.2 F (36.8 C)  TempSrc: Oral  SpO2: 100%  Weight: (!) 324 lb 14.4 oz (147.4 kg)   Physical Exam  Constitutional: She is oriented to person, place, and time. She appears well-developed and well-nourished. No distress.  HENT:  Head: Normocephalic and atraumatic.  Nose: Nose normal.  Cardiovascular: Normal rate and regular rhythm.  Exam reveals no friction rub.   No murmur heard. Pulmonary/Chest: Effort normal and breath sounds normal. No respiratory distress. She has no wheezes. She has no rales.  Abdominal: Soft. Bowel sounds are normal. She exhibits no distension. There is no tenderness. There is no rebound and no guarding.  Neurological: She is alert and oriented to person, place, and time.  Skin: Skin is warm and dry. No rash noted. She is not diaphoretic. No erythema. No  pallor.    Assessment & Plan:   See Encounters Tab for problem based charting.  Patient discussed with Dr. Dareen Piano

## 2016-10-30 NOTE — Assessment & Plan Note (Addendum)
BP Readings from Last 3 Encounters:  10/29/16 132/75  09/23/16 137/65  03/18/16 120/78    Lab Results  Component Value Date   NA 143 02/29/2016   K 3.8 02/29/2016   CREATININE 0.60 02/29/2016    Assessment: Blood pressure control:  controlled Progress toward BP goal:   at goal Comments: On Norvasc 10 mg and Cozaar 100 mg. She also takes potassium supplements when necessary. She has occasional leg swelling. I referred patient to switch Norvasc to hydrochlorothiazide to help with leg swelling and decrease the need for potassium supplements. However she does not want to change her current blood pressure regimen.  Plan: Medications:  continue current medications Other plans: Follow up in 3 To 6 months.

## 2016-10-30 NOTE — Assessment & Plan Note (Signed)
Lipid panel checked today. Her ASCVD risk with these results 5.2%. Called patient and informed her they she can stop taking zetia.

## 2016-10-30 NOTE — Assessment & Plan Note (Signed)
Stable. Refilled hydrocortisone lotion.

## 2016-10-30 NOTE — Assessment & Plan Note (Signed)
Referral placed for mammogram, eye exam, and colonoscopy.

## 2016-11-01 NOTE — Progress Notes (Signed)
Internal Medicine Clinic Attending  Case discussed with Dr. Truong at the time of the visit.  We reviewed the resident's history and exam and pertinent patient test results.  I agree with the assessment, diagnosis, and plan of care documented in the resident's note.  

## 2016-11-17 DIAGNOSIS — H2513 Age-related nuclear cataract, bilateral: Secondary | ICD-10-CM | POA: Diagnosis not present

## 2016-11-17 DIAGNOSIS — H04123 Dry eye syndrome of bilateral lacrimal glands: Secondary | ICD-10-CM | POA: Diagnosis not present

## 2016-11-17 DIAGNOSIS — H43811 Vitreous degeneration, right eye: Secondary | ICD-10-CM | POA: Diagnosis not present

## 2016-11-25 ENCOUNTER — Other Ambulatory Visit: Payer: Self-pay | Admitting: Internal Medicine

## 2016-11-25 DIAGNOSIS — Z1231 Encounter for screening mammogram for malignant neoplasm of breast: Secondary | ICD-10-CM

## 2016-12-16 ENCOUNTER — Ambulatory Visit: Payer: Medicare Other

## 2016-12-20 ENCOUNTER — Other Ambulatory Visit: Payer: Self-pay | Admitting: Internal Medicine

## 2016-12-24 ENCOUNTER — Ambulatory Visit (AMBULATORY_SURGERY_CENTER): Payer: Self-pay

## 2016-12-24 ENCOUNTER — Telehealth: Payer: Self-pay

## 2016-12-24 VITALS — Ht 67.0 in | Wt 322.0 lb

## 2016-12-24 DIAGNOSIS — Z800 Family history of malignant neoplasm of digestive organs: Secondary | ICD-10-CM

## 2016-12-24 MED ORDER — SUPREP BOWEL PREP KIT 17.5-3.13-1.6 GM/177ML PO SOLN
1.0000 | Freq: Once | ORAL | 0 refills | Status: AC
Start: 2016-12-24 — End: 2016-12-24

## 2016-12-24 NOTE — Telephone Encounter (Signed)
Dr Fuller Plan and Barbera Setters,      Pt BMI 50.43  Had PV today. ?direct [facility] pt or would she need OV first?  Please advise pt.                                                                               Thanks, Levada Dy

## 2016-12-24 NOTE — Telephone Encounter (Signed)
Patient has been scheduled for 01/26/17 at 10:15 at [facility].  She wil need to arrive by 8:45 in admitting.  Please notify the patient of the details

## 2016-12-24 NOTE — Telephone Encounter (Signed)
No answer.  Left detailed message on machine. Amy Jarvis/PV

## 2016-12-24 NOTE — Progress Notes (Signed)
No allergies to eggs or soy No past problems with anesthesia No diet meds No home oxygen  Declined emmi 

## 2016-12-24 NOTE — Telephone Encounter (Signed)
Direct colonoscopy at hospital for [place].0.

## 2016-12-25 DIAGNOSIS — N3281 Overactive bladder: Secondary | ICD-10-CM | POA: Diagnosis not present

## 2016-12-28 ENCOUNTER — Other Ambulatory Visit: Payer: Self-pay

## 2016-12-28 DIAGNOSIS — Z1211 Encounter for screening for malignant neoplasm of colon: Secondary | ICD-10-CM

## 2017-01-05 ENCOUNTER — Ambulatory Visit (INDEPENDENT_AMBULATORY_CARE_PROVIDER_SITE_OTHER): Payer: Medicare Other | Admitting: Pulmonary Disease

## 2017-01-05 ENCOUNTER — Encounter: Payer: Self-pay | Admitting: Pulmonary Disease

## 2017-01-05 VITALS — BP 150/82 | HR 93 | Temp 97.9°F | Wt 321.5 lb

## 2017-01-05 DIAGNOSIS — J3489 Other specified disorders of nose and nasal sinuses: Secondary | ICD-10-CM

## 2017-01-05 DIAGNOSIS — I10 Essential (primary) hypertension: Secondary | ICD-10-CM

## 2017-01-05 DIAGNOSIS — J0100 Acute maxillary sinusitis, unspecified: Secondary | ICD-10-CM

## 2017-01-05 DIAGNOSIS — R05 Cough: Secondary | ICD-10-CM

## 2017-01-05 DIAGNOSIS — Z79899 Other long term (current) drug therapy: Secondary | ICD-10-CM | POA: Diagnosis not present

## 2017-01-05 MED ORDER — AMOXICILLIN-POT CLAVULANATE 875-125 MG PO TABS: 1.0000 | ORAL_TABLET | Freq: Two times a day (BID) | ORAL | 0 refills | Status: AC

## 2017-01-05 NOTE — Assessment & Plan Note (Signed)
Assessment: 3 weeks of sinus pressure, yellow rhinorrhea, and cough productive of yellow mucus. Consistent with acute bacterial rhinosinusitis.   Plan: Augmentin 875-125mg  BID for 5 days Increase fluticasone to 2 sprays daily Nasal irrigation OTC pain medication as needed for pain Return precautions discussed

## 2017-01-05 NOTE — Progress Notes (Signed)
   CC: Sinus problems  HPI:  [name] is a 56 y.o. woman with history as noted below presenting with sinus problems.  This has been ongoing for the last three weeks. She has pain and pressure. She has yellow drainage from her nose. Her cough is productive of yellow sputum. She has had this before. Her teeth ache. She has ear pressure. She takes her allergy medicine. She also has been taking over the counter sinus and congestion medicine. She has had family members with the same symptoms. She has had chills but no fevers. No myalgias. No sick contacts with flu.    Past Medical History:  Diagnosis Date  . Allergic rhinitis 09/23/2015  . Carpal tunnel syndrome, bilateral   . Cervical polyp    possible history of cervical polyp  . Cystocele 01/13/2010  . DDD (degenerative disc disease), lumbar   . Degenerative disk disease 10/07/2012  . Degenerative lumbar spinal stenosis   . Diverticulosis 03/26/2010  . GERD 09/05/2009  . Hyperlipidemia LDL goal < 130 09/25/2008  . Hypertension 09/25/2008  . Microscopic hematuria 01/13/2010  . Morbid obesity 08/28/2010  . Obstructive sleep apnea on CPAP 06/20/2010  . Perimenopausal 01/13/2010  . Plantar fasciitis   . Pneumonia    6/16  . Uterine prolapse 11/19/2008  . Vertigo 10/07/2012    Review of Systems:   Nausea but no vomiting No diarrhea  Physical Exam:  Vitals:   01/05/17 0907  BP: (!) 150/82  Pulse: 93  Temp: 97.9 F (36.6 C)  TempSrc: Oral  SpO2: 98%  Weight: (!) 321 lb 8 oz (145.8 kg)   General Apperance: NAD HEENT: Normocephalic, atraumatic, anicteric sclera, tympanic membranes without erythema or effusion bilaterally, some redness of nasal turbinates, oropharynx clear Neck: Supple, trachea midline Lungs: Clear to auscultation bilaterally. No wheezes, rhonchi or rales. Breathing comfortably on room air Heart: Regular rate and rhythm Abdomen: Soft, nontender, nondistended, no rebound/guarding Extremities: Warm and  well perfused, no edema Skin: No rashes or lesions Neurologic: Alert and interactive. No gross deficits.  Assessment & Plan:   See Encounters Tab for problem based charting.  Patient discussed with Dr. Angelia Mould

## 2017-01-05 NOTE — Assessment & Plan Note (Signed)
Assessment: BP elevated at 150/82 likely in setting of acute sinusitis.   Plan: Continue amlodipine 10mg  daily, losartan 100mg  daily. Recheck at follow up with PCP

## 2017-01-05 NOTE — Patient Instructions (Addendum)
Take Augmentin (amoxicillin-clavulanate) 875 mg/125 mg orally twice daily until you complete your bottle.  For symptom relief you may use:  Pain relief: Nonprescription pain medications, such as acetaminophen (eg, Tylenol) or ibuprofen (eg, Motrin, Advil), are recommended for pain.  Nasal irrigation: Flushing the nose and sinuses with a saline solution several times per day has been proven to decrease pain associated with congestion and shorten the duration of symptoms. Instructions for nasal irrigation are provided below.  Nasal steroids: Nasal steroids (steroids delivered by a nasal spray) can help to reduce swelling inside the nose, usually within two to three days. These drugs have few side effects and relieve symptoms in most people.  Other treatments  Oral decongestants - Oral decongestants (most commonly pseudoephedrine and phenylephrine) may be helpful if you have associated symptoms of ear pain or fullness.  Mucolytics - Medications to thin secretions (such as guaifenesin) may help to clear mucus.  Please call if you have any questions or concerns.  BUFFERED ISOTONIC SALINE NASAL IRRIGATION  The Benefits:  1. When you irrigate, the isotonic saline (salt water) acts as a solvent and washes the mucus crusts and other debris from your nose.  2. This decongests and improves the airflow into your nose. The sinus passages begin to open.  3. Studies have also shown that a salt water and an alkaline (baking soda) irrigation solution improves nasal membrane cell function (mucociliary flow of mucus debris).  The Recipe:  1. Choose a 1-quart glass jar that is thoroughly cleansed.  2. Fill with sterile or distilled water, or you can boil water from the tap.  3. Add 1 to 2 heaping teaspoons of "pickling/canning/sea" salt (NOT table salt as it contains a large number of additives). This salt is available at the grocery store in the food canning section.  4. Add 1 teaspoon of [employer] Baking Soda (pure bicarbonate).  5. Mix ingredients together and store at room temperature. Discard after one week. If you find this solution too strong, you may decrease the amount of salt added to 1 to 1  teaspoons. With children it is often best to start with a milder solution and advance slowly. Irrigate with 240 ml (8 oz) twice daily.  The Instructions:  You should plan to irrigate your nose with buffered isotonic saline 2 times per day. Many people prefer to warm the solution slightly in the microwave - but be sure that the solution is NOT HOT. Stand over the sink (some do this in the shower) and squirt the solution into each side of your nose, keeping your mouth open. This allows you to spit the saltwater out of your mouth. It will not harm you if you swallow a little.  If you have been told to use a nasal steroid such as Flonase, Nasonex, or Nasacort, you should always use isortonic saline solution first, then use your nasal steroid product. The nasal steroid is much more effective when sprayed onto clean nasal membranes and the steroid medicine will reach deeper into the nose.  Most people experience a little burning sensation the first few times they use a isotonic saline solution, but this usually goes away within a few days.

## 2017-01-07 ENCOUNTER — Other Ambulatory Visit: Payer: Self-pay | Admitting: Internal Medicine

## 2017-01-07 ENCOUNTER — Encounter: Payer: Medicare Other | Admitting: Gastroenterology

## 2017-01-07 DIAGNOSIS — I10 Essential (primary) hypertension: Secondary | ICD-10-CM

## 2017-01-07 NOTE — Progress Notes (Signed)
Internal Medicine Clinic Attending  Case discussed with Dr. Krall at the time of the visit.  We reviewed the resident's history and exam and pertinent patient test results.  I agree with the assessment, diagnosis, and plan of care documented in the resident's note.  

## 2017-01-22 ENCOUNTER — Telehealth: Payer: Self-pay | Admitting: Gastroenterology

## 2017-01-22 NOTE — Telephone Encounter (Signed)
All questions answered about upcoming procedure on 01/26/17.  She will call back for any additional questions or concerns.

## 2017-01-25 ENCOUNTER — Telehealth: Payer: Self-pay | Admitting: Gastroenterology

## 2017-01-25 NOTE — Telephone Encounter (Signed)
OK 

## 2017-01-25 NOTE — Telephone Encounter (Signed)
Patient with fever this afternoon at her GYN office.  She had fever despite being treated with ibuprofen.  She was started on antibiotics and will need to reschedule her colonoscopy. Her GYN did not want her to proceed with the colonoscopy tomorrow.    I will contact her tomorrow to reschedule. Endoscopy is closed for the afternoon. She was your 10:15 case tomorrow.

## 2017-01-26 NOTE — Telephone Encounter (Signed)
Patient has been rescheduled to 03/23/17 9:45 am.  Left message for patient to call back

## 2017-01-28 NOTE — Telephone Encounter (Signed)
Patient notified of new appt date and time.  She is asked to arrive at 8:15

## 2017-02-15 DIAGNOSIS — G4733 Obstructive sleep apnea (adult) (pediatric): Secondary | ICD-10-CM | POA: Diagnosis not present

## 2017-02-17 ENCOUNTER — Telehealth: Payer: Self-pay

## 2017-02-17 NOTE — Telephone Encounter (Signed)
Wants to restart HCTZ that was discontinued 1 1/2 yrs ago, appt [facility] 3/28 at 1545

## 2017-02-17 NOTE — Telephone Encounter (Signed)
Need to speak with a nurse about meds. Please call back.

## 2017-02-24 ENCOUNTER — Ambulatory Visit: Payer: Medicare Other

## 2017-02-24 ENCOUNTER — Ambulatory Visit (INDEPENDENT_AMBULATORY_CARE_PROVIDER_SITE_OTHER): Payer: Medicare Other | Admitting: Internal Medicine

## 2017-02-24 ENCOUNTER — Encounter: Payer: Self-pay | Admitting: Internal Medicine

## 2017-02-24 ENCOUNTER — Encounter (INDEPENDENT_AMBULATORY_CARE_PROVIDER_SITE_OTHER): Payer: Self-pay

## 2017-02-24 VITALS — BP 149/88 | HR 95 | Temp 98.4°F | Wt 326.1 lb

## 2017-02-24 DIAGNOSIS — M722 Plantar fascial fibromatosis: Secondary | ICD-10-CM

## 2017-02-24 DIAGNOSIS — I10 Essential (primary) hypertension: Secondary | ICD-10-CM | POA: Diagnosis not present

## 2017-02-24 DIAGNOSIS — Z791 Long term (current) use of non-steroidal anti-inflammatories (NSAID): Secondary | ICD-10-CM | POA: Diagnosis not present

## 2017-02-24 DIAGNOSIS — Z6841 Body Mass Index (BMI) 40.0 and over, adult: Secondary | ICD-10-CM | POA: Diagnosis not present

## 2017-02-24 DIAGNOSIS — J309 Allergic rhinitis, unspecified: Secondary | ICD-10-CM

## 2017-02-24 DIAGNOSIS — Z79899 Other long term (current) drug therapy: Secondary | ICD-10-CM

## 2017-02-24 MED ORDER — IBUPROFEN 800 MG PO TABS: 800.0000 mg | ORAL_TABLET | Freq: Every evening | ORAL | 1 refills | Status: DC | PRN

## 2017-02-24 MED ORDER — CARVEDILOL 6.25 MG PO TABS: 6.2500 mg | ORAL_TABLET | Freq: Two times a day (BID) | ORAL | 11 refills | Status: DC

## 2017-02-24 MED ORDER — CYCLOBENZAPRINE HCL 10 MG PO TABS: 10.0000 mg | ORAL_TABLET | Freq: Two times a day (BID) | ORAL | 3 refills | Status: DC | PRN

## 2017-02-24 MED ORDER — FLUTICASONE PROPIONATE 50 MCG/ACT NA SUSP: 1.0000 | Freq: Every day | NASAL | 3 refills | Status: DC

## 2017-02-24 NOTE — Progress Notes (Signed)
   CC: HTN  HPI:  [name] is a 56 y.o. with PMHx as outlined below who presents to clinic for htn follow up. Please see problem list for further details of patient's chronic medical issues.    Past Medical History:  Diagnosis Date  . Allergic rhinitis 09/23/2015  . Carpal tunnel syndrome, bilateral   . Cervical polyp    possible history of cervical polyp  . Cystocele 01/13/2010  . DDD (degenerative disc disease), lumbar   . Degenerative disk disease 10/07/2012  . Degenerative lumbar spinal stenosis   . Diverticulosis 03/26/2010  . GERD 09/05/2009  . Hyperlipidemia LDL goal < 130 09/25/2008  . Hypertension 09/25/2008  . Microscopic hematuria 01/13/2010  . Morbid obesity 08/28/2010  . Obstructive sleep apnea on CPAP 06/20/2010  . Perimenopausal 01/13/2010  . Plantar fasciitis   . Pneumonia    6/16  . Uterine prolapse 11/19/2008  . Vertigo 10/07/2012    Review of Systems:  Denies dysuria, SOB, and orthopnea. Positive for chronic LE swelling and overactive bladder.   Physical Exam:  Vitals:   02/24/17 1611  BP: (!) 149/88  Pulse: 95  Temp: 98.4 F (36.9 C)  TempSrc: Oral  SpO2: 99%  Weight: (!) 326 lb 1.6 oz (147.9 kg)   Physical Exam  Constitutional: morbidly obese. appears well-developed and well-nourished. No distress.  HENT:  Head: Normocephalic and atraumatic.  Nose: Nose normal.  Cardiovascular: Normal rate, regular rhythm and normal heart sounds.  Exam reveals no gallop and no friction rub.   No murmur heard. Pulmonary/Chest: Effort normal and breath sounds normal. No respiratory distress.  has no wheezes.no rales.  Neurological: alert and oriented to person, place, and time.  Skin: Skin is warm and dry. No rash noted.  not diaphoretic. No erythema. No pallor.  Ext: 2+ pedal edema b/l   Assessment & Plan:   See Encounters Tab for problem based charting.  Patient discussed with Dr. Evette Doffing

## 2017-02-25 NOTE — Assessment & Plan Note (Signed)
A: pt having leg swelling w/ norvasc 10mg , she will cut down on her dose to 5mg  and notice improvement of leg swelling but will have elevated BP. She was taken of HCTZ 2/2 overactive bladder for which she takes myrbetriq for. She is also on losartan 100mg .   P: rx for coreg 6.25mg  BID, f/u in 1 month to increase dose if needed. Advised low salt diet.

## 2017-02-25 NOTE — Progress Notes (Signed)
Internal Medicine Clinic Attending  Case discussed with Dr. Truong at the time of the visit.  We reviewed the resident's history and exam and pertinent patient test results.  I agree with the assessment, diagnosis, and plan of care documented in the resident's note.  

## 2017-02-25 NOTE — Assessment & Plan Note (Signed)
Pt has been taking flexeril 10mg  BID prn and ibuprofen 800mg  qhs for this. She has also been doing foot stretches. Refilled flexeril and ibuprofen.

## 2017-02-25 NOTE — Assessment & Plan Note (Signed)
Stable, refilled flonase 51mcg 1 spray qd.

## 2017-03-10 DIAGNOSIS — H40013 Open angle with borderline findings, low risk, bilateral: Secondary | ICD-10-CM | POA: Diagnosis not present

## 2017-03-10 DIAGNOSIS — H43391 Other vitreous opacities, right eye: Secondary | ICD-10-CM | POA: Diagnosis not present

## 2017-03-10 DIAGNOSIS — H25013 Cortical age-related cataract, bilateral: Secondary | ICD-10-CM | POA: Diagnosis not present

## 2017-03-10 DIAGNOSIS — H209 Unspecified iridocyclitis: Secondary | ICD-10-CM | POA: Diagnosis not present

## 2017-03-10 DIAGNOSIS — H43811 Vitreous degeneration, right eye: Secondary | ICD-10-CM | POA: Diagnosis not present

## 2017-03-22 ENCOUNTER — Encounter (HOSPITAL_COMMUNITY): Payer: Self-pay | Admitting: *Deleted

## 2017-03-23 ENCOUNTER — Ambulatory Visit (HOSPITAL_COMMUNITY): Payer: Medicare Other | Admitting: Anesthesiology

## 2017-03-23 ENCOUNTER — Encounter (HOSPITAL_COMMUNITY)
Admission: RE | Disposition: A | Discharge status: Home / Self Care | Payer: Self-pay | Source: Ambulatory Visit | Attending: Gastroenterology

## 2017-03-23 ENCOUNTER — Ambulatory Visit (HOSPITAL_COMMUNITY)
Admission: RE | Admit: 2017-03-23 | Discharge: 2017-03-23 | Disposition: A | Discharge status: Home / Self Care | Payer: Medicare Other | Source: Ambulatory Visit | Attending: Gastroenterology | Admitting: Gastroenterology

## 2017-03-23 ENCOUNTER — Encounter (HOSPITAL_COMMUNITY): Payer: Self-pay | Admitting: *Deleted

## 2017-03-23 ENCOUNTER — Telehealth: Payer: Self-pay | Admitting: Internal Medicine

## 2017-03-23 DIAGNOSIS — G4733 Obstructive sleep apnea (adult) (pediatric): Secondary | ICD-10-CM | POA: Diagnosis not present

## 2017-03-23 DIAGNOSIS — M48061 Spinal stenosis, lumbar region without neurogenic claudication: Secondary | ICD-10-CM | POA: Diagnosis not present

## 2017-03-23 DIAGNOSIS — M5136 Other intervertebral disc degeneration, lumbar region: Secondary | ICD-10-CM | POA: Diagnosis not present

## 2017-03-23 DIAGNOSIS — K635 Polyp of colon: Secondary | ICD-10-CM | POA: Diagnosis not present

## 2017-03-23 DIAGNOSIS — E785 Hyperlipidemia, unspecified: Secondary | ICD-10-CM | POA: Insufficient documentation

## 2017-03-23 DIAGNOSIS — K6389 Other specified diseases of intestine: Secondary | ICD-10-CM | POA: Insufficient documentation

## 2017-03-23 DIAGNOSIS — D123 Benign neoplasm of transverse colon: Secondary | ICD-10-CM | POA: Diagnosis not present

## 2017-03-23 DIAGNOSIS — Z1211 Encounter for screening for malignant neoplasm of colon: Secondary | ICD-10-CM | POA: Insufficient documentation

## 2017-03-23 DIAGNOSIS — Z800 Family history of malignant neoplasm of digestive organs: Secondary | ICD-10-CM | POA: Diagnosis not present

## 2017-03-23 DIAGNOSIS — Z1212 Encounter for screening for malignant neoplasm of rectum: Secondary | ICD-10-CM | POA: Diagnosis not present

## 2017-03-23 DIAGNOSIS — G5603 Carpal tunnel syndrome, bilateral upper limbs: Secondary | ICD-10-CM | POA: Insufficient documentation

## 2017-03-23 DIAGNOSIS — K5730 Diverticulosis of large intestine without perforation or abscess without bleeding: Secondary | ICD-10-CM | POA: Insufficient documentation

## 2017-03-23 DIAGNOSIS — I10 Essential (primary) hypertension: Secondary | ICD-10-CM | POA: Insufficient documentation

## 2017-03-23 DIAGNOSIS — K219 Gastro-esophageal reflux disease without esophagitis: Secondary | ICD-10-CM | POA: Insufficient documentation

## 2017-03-23 HISTORY — PX: COLONOSCOPY WITH PROPOFOL: SHX5780

## 2017-03-23 HISTORY — DX: Other complications of anesthesia, initial encounter: T88.59XA

## 2017-03-23 HISTORY — DX: Adverse effect of unspecified anesthetic, initial encounter: T41.45XA

## 2017-03-23 SURGERY — COLONOSCOPY WITH PROPOFOL
Anesthesia: Monitor Anesthesia Care

## 2017-03-23 MED ORDER — LACTATED RINGERS IV SOLN
INTRAVENOUS | Status: DC
  Administered 2017-03-23: 1000 mL via INTRAVENOUS

## 2017-03-23 MED ORDER — PROPOFOL 10 MG/ML IV BOLUS
INTRAVENOUS | Status: AC
  Filled 2017-03-23: qty 40

## 2017-03-23 MED ORDER — PROPOFOL 500 MG/50ML IV EMUL
INTRAVENOUS | Status: DC | PRN
  Administered 2017-03-23: 50 mg via INTRAVENOUS
  Administered 2017-03-23: 10 mg via INTRAVENOUS

## 2017-03-23 MED ORDER — PROPOFOL 500 MG/50ML IV EMUL
INTRAVENOUS | Status: DC | PRN
  Administered 2017-03-23: 100 ug/kg/min via INTRAVENOUS

## 2017-03-23 MED ORDER — SODIUM CHLORIDE 0.9 % IV SOLN: INTRAVENOUS | Status: DC

## 2017-03-23 SURGICAL SUPPLY — 21 items

## 2017-03-23 NOTE — Telephone Encounter (Signed)
APT. REMINDER CALL, LMTCB °

## 2017-03-23 NOTE — Anesthesia Postprocedure Evaluation (Signed)
Anesthesia Post Note  Patient: Amy Jarvis  Procedure(s) Performed: Procedure(s) (LRB): COLONOSCOPY WITH PROPOFOL (N/A)  Patient location during evaluation: PACU Anesthesia Type: MAC Level of consciousness: awake and alert Pain management: pain level controlled Vital Signs Assessment: post-procedure vital signs reviewed and stable Respiratory status: spontaneous breathing, nonlabored ventilation, respiratory function stable and patient connected to nasal cannula oxygen Cardiovascular status: stable and blood pressure returned to baseline Anesthetic complications: no       Last Vitals:  Vitals:   03/23/17 1035 03/23/17 1045  BP: (!) 208/104 (!) 182/86  Pulse: 68   Resp: (!) 25   Temp:      Last Pain:  Vitals:   03/23/17 1025  TempSrc: Oral                 [name]

## 2017-03-23 NOTE — Anesthesia Preprocedure Evaluation (Addendum)
Anesthesia Evaluation  Patient identified by MRN, date of birth, ID band Patient awake    Reviewed: Allergy & Precautions, NPO status , Patient's Chart, lab work & pertinent test results  Airway Mallampati: II  TM Distance: >3 FB Neck ROM: Full    Dental no notable dental hx.    Pulmonary sleep apnea and Continuous Positive Airway Pressure Ventilation ,    Pulmonary exam normal breath sounds clear to auscultation       Cardiovascular hypertension, Normal cardiovascular exam Rhythm:Regular Rate:Normal     Neuro/Psych negative neurological ROS  negative psych ROS   GI/Hepatic negative GI ROS, Neg liver ROS,   Endo/Other  Morbid obesity  Renal/GU negative Renal ROS  negative genitourinary   Musculoskeletal negative musculoskeletal ROS (+)   Abdominal   Peds negative pediatric ROS (+)  Hematology negative hematology ROS (+)   Anesthesia Other Findings   Reproductive/Obstetrics negative OB ROS                             Anesthesia Physical Anesthesia Plan  ASA: III  Anesthesia Plan: MAC   Post-op Pain Management:    Induction: Intravenous  Airway Management Planned: Nasal Cannula  Additional Equipment:   Intra-op Plan:   Post-operative Plan:   Informed Consent: I have reviewed the patients History and Physical, chart, labs and discussed the procedure including the risks, benefits and alternatives for the proposed anesthesia with the patient or authorized representative who has indicated his/her understanding and acceptance.   Dental advisory given  Plan Discussed with: CRNA and Surgeon  Anesthesia Plan Comments:         Anesthesia Quick Evaluation

## 2017-03-23 NOTE — Interval H&P Note (Signed)
History and Physical Interval Note:  03/23/2017 9:29 AM  [name]  has presented today for surgery, with the diagnosis of screening colon  The various methods of treatment have been discussed with the patient and family. After consideration of risks, benefits and other options for treatment, the patient has consented to  Procedure(s): COLONOSCOPY WITH PROPOFOL (N/A) as a surgical intervention .  The patient's history has been reviewed, patient examined, no change in status, stable for surgery.  I have reviewed the patient's chart and labs.  Questions were answered to the patient's satisfaction.     Pricilla Riffle. Fuller Plan

## 2017-03-23 NOTE — H&P (View-Only) (Signed)
   CC: HTN  HPI:  Ms.Legaci A Elsasser is a 56 y.o. with PMHx as outlined below who presents to clinic for htn follow up. Please see problem list for further details of patient's chronic medical issues.    Past Medical History:  Diagnosis Date  . Allergic rhinitis 09/23/2015  . Carpal tunnel syndrome, bilateral   . Cervical polyp    possible history of cervical polyp  . Cystocele 01/13/2010  . DDD (degenerative disc disease), lumbar   . Degenerative disk disease 10/07/2012  . Degenerative lumbar spinal stenosis   . Diverticulosis 03/26/2010  . GERD 09/05/2009  . Hyperlipidemia LDL goal < 130 09/25/2008  . Hypertension 09/25/2008  . Microscopic hematuria 01/13/2010  . Morbid obesity 08/28/2010  . Obstructive sleep apnea on CPAP 06/20/2010  . Perimenopausal 01/13/2010  . Plantar fasciitis   . Pneumonia    6/16  . Uterine prolapse 11/19/2008  . Vertigo 10/07/2012    Review of Systems:  Denies dysuria, SOB, and orthopnea. Positive for chronic LE swelling and overactive bladder.   Physical Exam:  Vitals:   02/24/17 1611  BP: (!) 149/88  Pulse: 95  Temp: 98.4 F (36.9 C)  TempSrc: Oral  SpO2: 99%  Weight: (!) 326 lb 1.6 oz (147.9 kg)   Physical Exam  Constitutional: morbidly obese. appears well-developed and well-nourished. No distress.  HENT:  Head: Normocephalic and atraumatic.  Nose: Nose normal.  Cardiovascular: Normal rate, regular rhythm and normal heart sounds.  Exam reveals no gallop and no friction rub.   No murmur heard. Pulmonary/Chest: Effort normal and breath sounds normal. No respiratory distress.  has no wheezes.no rales.  Neurological: alert and oriented to person, place, and time.  Skin: Skin is warm and dry. No rash noted.  not diaphoretic. No erythema. No pallor.  Ext: 2+ pedal edema b/l   Assessment & Plan:   See Encounters Tab for problem based charting.  Patient discussed with Dr. Evette Doffing

## 2017-03-23 NOTE — Transfer of Care (Signed)
Immediate Anesthesia Transfer of Care Note  Patient: Amy Jarvis  Procedure(s) Performed: Procedure(s): COLONOSCOPY WITH PROPOFOL (N/A)  Patient Location: PACU  Anesthesia Type:MAC  Level of Consciousness: awake, alert  and oriented  Airway & Oxygen Therapy: Patient Spontanous Breathing and Patient connected to face mask oxygen  Post-op Assessment: Report given to RN and Post -op Vital signs reviewed and stable  Post vital signs: Reviewed and stable  Last Vitals:  Vitals:   03/23/17 0858  BP: (!) 195/86  Pulse: 71  Resp: 17  Temp: 36.7 C    Last Pain:  Vitals:   03/23/17 0858  TempSrc: Oral         Complications: No apparent anesthesia complications

## 2017-03-23 NOTE — Discharge Instructions (Signed)
YOU HAD AN ENDOSCOPIC PROCEDURE TODAY: Refer to the procedure report and other information in the discharge instructions given to you for any specific questions about what was found during the examination. If this information does not answer your questions, please call [place] office at [phone] to clarify.  ° °YOU SHOULD EXPECT: Some feelings of bloating in the abdomen. Passage of more gas than usual. Walking can help get rid of the air that was put into your GI tract during the procedure and reduce the bloating. If you had a lower endoscopy (such as a colonoscopy or flexible sigmoidoscopy) you may notice spotting of blood in your stool or on the toilet paper. Some abdominal soreness may be present for a day or two, also. ° °DIET: Your first meal following the procedure should be a light meal and then it is ok to progress to your normal diet. A half-sandwich or bowl of soup is an example of a good first meal. Heavy or fried foods are harder to digest and may make you feel nauseous or bloated. Drink plenty of fluids but you should avoid alcoholic beverages for 24 hours. If you had a esophageal dilation, please see attached instructions for diet.   ° °ACTIVITY: Your care partner should take you home directly after the procedure. You should plan to take it easy, moving slowly for the rest of the day. You can resume normal activity the day after the procedure however YOU SHOULD NOT DRIVE, use power tools, machinery or perform tasks that involve climbing or major physical exertion for 24 hours (because of the sedation medicines used during the test).  ° °SYMPTOMS TO REPORT IMMEDIATELY: °A gastroenterologist can be reached at any hour. Please call [phone]  for any of the following symptoms:  °Following lower endoscopy (colonoscopy, flexible sigmoidoscopy) °Excessive amounts of blood in the stool  °Significant tenderness, worsening of abdominal pains  °Swelling of the abdomen that is new, acute  °Fever of 100° or  higher  °Following upper endoscopy (EGD, EUS, ERCP, esophageal dilation) °Vomiting of blood or coffee ground material  °New, significant abdominal pain  °New, significant chest pain or pain under the shoulder blades  °Painful or persistently difficult swallowing  °New shortness of breath  °Black, tarry-looking or red, bloody stools ° °FOLLOW UP:  °If any biopsies were taken you will be contacted by phone or by letter within the next 1-3 weeks. Call [phone]  if you have not heard about the biopsies in 3 weeks.  °Please also call with any specific questions about appointments or follow up tests. ° °

## 2017-03-23 NOTE — Op Note (Signed)
[facility] Patient Name: Amy Jarvis Procedure Date: 03/23/2017 MRN: [id number] Attending MD: Ladene Artist , MD Date of Birth: 04/02/1961 CSN: [id number] Age: 56 Admit Type: Outpatient Procedure:                Colonoscopy Indications:              Screening in patient at increased risk: Family                            history of 1st-degree relative with colorectal                            cancer Providers:                Pricilla Riffle. Fuller Plan, MD, Cleda Daub, RN, William Dalton, Technician Referring MD:             Charlyne Mom, MD Medicines:                Monitored Anesthesia Care Complications:            No immediate complications. Estimated blood loss:                            None. Estimated Blood Loss:     Estimated blood loss: none. Procedure:                Pre-Anesthesia Assessment:                           - Prior to the procedure, a History and Physical                            was performed, and patient medications and                            allergies were reviewed. The patient's tolerance of                            previous anesthesia was also reviewed. The risks                            and benefits of the procedure and the sedation                            options and risks were discussed with the patient.                            All questions were answered, and informed consent                            was obtained. Prior Anticoagulants: The patient has                            taken no previous anticoagulant or  antiplatelet                            agents. ASA Grade Assessment: II - A patient with                            mild systemic disease. After reviewing the risks                            and benefits, the patient was deemed in                            satisfactory condition to undergo the procedure.                           After obtaining informed consent, the colonoscope                             was passed under direct vision. Throughout the                            procedure, the patient's blood pressure, pulse, and                            oxygen saturations were monitored continuously. The                            EC-[id number] ([id number]) scope was introduced through                            the anus and advanced to the the cecum, identified                            by appendiceal orifice and ileocecal valve. The                            ileocecal valve, appendiceal orifice, and rectum                            were photographed. The quality of the bowel                            preparation was good. The colonoscopy was performed                            without difficulty. The patient tolerated the                            procedure well. Scope In: 9:58:41 AM Scope Out: 10:12:08 AM Scope Withdrawal Time: 0 hours 8 minutes 50 seconds  Total Procedure Duration: 0 hours 13 minutes 27 seconds  Findings:      The perianal and digital rectal examinations were normal.      A 6 mm polyp was found in the splenic flexure. The polyp was sessile.  The polyp was removed with a cold snare. Resection and retrieval were       complete.      Many small-mouthed diverticula were found in the left colon. There was       narrowing of the colon in association with the diverticular opening.       There was evidence of diverticular spasm. There was no evidence of       diverticular bleeding.      The exam was otherwise without abnormality on direct and retroflexion       views. Impression:               - One 6 mm polyp at the splenic flexure, removed                            with a cold snare. Resected and retrieved.                           - Moderate diverticulosis in the left colon. .                           - The examination was otherwise normal on direct                            and retroflexion views. Moderate Sedation:      N/A- Per  Anesthesia Care Recommendation:           - Repeat colonoscopy in 5 years for surveillance.                           - Patient has a contact number available for                            emergencies. The signs and symptoms of potential                            delayed complications were discussed with the                            patient. Return to normal activities tomorrow.                            Written discharge instructions were provided to the                            patient.                           - High fiber diet.                           - Continue present medications.                           - Await pathology results. Procedure Code(s):        --- Professional ---  45385, Colonoscopy, flexible; with removal of                            tumor(s), polyp(s), or other lesion(s) by snare                            technique Diagnosis Code(s):        --- Professional ---                           Z80.0, Family history of malignant neoplasm of                            digestive organs                           D12.3, Benign neoplasm of transverse colon (hepatic                            flexure or splenic flexure)                           K57.30, Diverticulosis of large intestine without                            perforation or abscess without bleeding CPT copyright 2016 American Medical Association. All rights reserved. The codes documented in this report are preliminary and upon coder review may  be revised to meet current compliance requirements. Ladene Artist, MD 03/23/2017 10:19:12 AM This report has been signed electronically. Number of Addenda: 0

## 2017-03-24 ENCOUNTER — Encounter: Payer: Self-pay | Admitting: Internal Medicine

## 2017-03-24 ENCOUNTER — Ambulatory Visit (INDEPENDENT_AMBULATORY_CARE_PROVIDER_SITE_OTHER): Payer: Medicare Other | Admitting: Internal Medicine

## 2017-03-24 VITALS — BP 158/85 | HR 72 | Temp 98.3°F | Ht 67.0 in | Wt 320.1 lb

## 2017-03-24 DIAGNOSIS — I10 Essential (primary) hypertension: Secondary | ICD-10-CM

## 2017-03-24 DIAGNOSIS — M722 Plantar fascial fibromatosis: Secondary | ICD-10-CM | POA: Diagnosis not present

## 2017-03-24 DIAGNOSIS — E669 Obesity, unspecified: Secondary | ICD-10-CM | POA: Diagnosis not present

## 2017-03-24 MED ORDER — NIFEDIPINE ER OSMOTIC RELEASE 30 MG PO TB24: 30.0000 mg | ORAL_TABLET | Freq: Every day | ORAL | 2 refills | Status: DC

## 2017-03-24 MED ORDER — IBUPROFEN 800 MG PO TABS: 800.0000 mg | ORAL_TABLET | Freq: Every evening | ORAL | 0 refills | Status: DC | PRN

## 2017-03-24 MED ORDER — ACETAMINOPHEN 500 MG PO TABS: 1000.0000 mg | ORAL_TABLET | Freq: Three times a day (TID) | ORAL | 0 refills | Status: DC | PRN

## 2017-03-24 NOTE — Patient Instructions (Signed)
General Instructions: - Start Nifedipine 30 mg daily - Continue Coreg and Losartan - Refills made for ibuprofen and tylenol. Try to alternate between the two as ibuprofen can increase your blood pressure.  Please bring your medicines with you each time you come to clinic.  Medicines may include prescription medications, over-the-counter medications, herbal remedies, eye drops, vitamins, or other pills.   Progress Toward Treatment Goals:  Treatment Goal 05/22/2015  Blood pressure at goal  Prevent falls -    Self Care Goals & Plans:  Self Care Goal 11/11/2015  Manage my medications take my medicines as prescribed; bring my medications to every visit; refill my medications on time  Monitor my health -  Eat healthy foods eat more vegetables; eat foods that are low in salt; eat baked foods instead of fried foods  Be physically active find an activity I enjoy  Meeting treatment goals -    No flowsheet data found.   Care Management & Community Referrals:  Referral 05/22/2015  Referrals made for care management support none needed  Referrals made to community resources none

## 2017-03-24 NOTE — Progress Notes (Signed)
   CC: Hypertension follow up  HPI:  [name] is a 56 y.o. woman with PMHx as noted below who presents today for follow up of her hypertension.  HTN: BP elevated today at 163/84. Repeat BP 158/85. She is taking Coreg 6.25 mg BID and Losartan 100 mg daily. She reports her BPs have been elevated at home in the 979G-921J systolic. She had to stop Norvasc recently due to uncomfortable leg swelling. She has been on Clonidine in the past but was unsure why this was stopped. She is unable to take HCTZ due to increased urinary frequency.   Plantar fasciitis: Patient is requesting a refill of her ibuprofen to help with her plantar fasciitis pain. She reports the pain is controlled with ibuprofen 800 mg TID. She is also taking Flexeril as needed. She has been doing her daily foot exercises.   Past Medical History:  Diagnosis Date  . Allergic rhinitis 09/23/2015  . Carpal tunnel syndrome, bilateral   . Cervical polyp    possible history of cervical polyp  . Complication of anesthesia    low bp after hysterctomy  . Cystocele 01/13/2010  . DDD (degenerative disc disease), lumbar   . Degenerative disk disease 10/07/2012  . Degenerative lumbar spinal stenosis   . Diverticulosis 03/26/2010  . GERD 09/05/2009  . Hyperlipidemia LDL goal < 130 09/25/2008  . Hypertension 09/25/2008  . Microscopic hematuria 01/13/2010  . Morbid obesity 08/28/2010  . Obstructive sleep apnea on CPAP 06/20/2010   cpap   . Plantar fasciitis   . Pneumonia    6/16  . Uterine prolapse 11/19/2008  . Vertigo 10/07/2012    Review of Systems:   All negative except per HPI  Physical Exam:  Vitals:   03/24/17 1342 03/24/17 1344  BP: (!) 163/84 (!) 158/85  Pulse: 77 72  Temp: 98.3 F (36.8 C)   SpO2: 98%   Weight: (!) 320 lb 1.6 oz (145.2 kg)   Height: 5\' 7"  (1.702 m)    Repeat BP 158/85  General: Obese woman in NAD CV: RRR, no m/g/r Ext: 1+ pitting peripheral edema bilaterally  Assessment & Plan:   See  Encounters Tab for problem based charting.  Patient discussed with Dr. Lynnae January

## 2017-03-25 ENCOUNTER — Encounter (HOSPITAL_COMMUNITY): Payer: Self-pay | Admitting: Gastroenterology

## 2017-03-25 LAB — BMP8+ANION GAP
Anion Gap: 18 mmol/L (ref 10.0–18.0)
BUN/Creatinine Ratio: 23 (ref 9–23)
BUN: 15 mg/dL (ref 6–24)
CALCIUM: 9.3 mg/dL (ref 8.7–10.2)
CO2: 25 mmol/L (ref 18–29)
CREATININE: 0.66 mg/dL (ref 0.57–1.00)
Chloride: 100 mmol/L (ref 96–106)
GFR calc Af Amer: 114 mL/min/{1.73_m2} (ref >59)
GFR, EST NON AFRICAN AMERICAN: 99 mL/min/{1.73_m2} (ref >59)
Glucose: 74 mg/dL (ref 65–99)
Potassium: 3.9 mmol/L (ref 3.5–5.2)
SODIUM: 143 mmol/L (ref 134–144)

## 2017-03-25 NOTE — Assessment & Plan Note (Addendum)
BP remains elevated on Coreg and Losartan. Discussed with patient that since her BP was well controlled on Amlodipine that it would be worth trying Nifedipine. Discussed with her that there is a possibility of leg swelling as with Amlodipine but that it is impossible to know which patients will experience this side effect. She is willing to try this medication. Will have her start Nifedipine 30 mg daily and follow up in 2 weeks for BP recheck. If unable to tolerate Nifedipine, would consider a Clonidine patch or Chlorthalidone.

## 2017-03-25 NOTE — Assessment & Plan Note (Signed)
Her plantar fasciitis pain seems controlled with ibuprofen. Refill was given. Also suggested she try alternating with Tylenol 1000 mg TID as NSAIDs can cause elevation in BP too. She was agreeable to this plan. Can consider steroid injection in future if pain remains uncontrolled. Will check a bmet today to assess her Cr given her high ibuprofen usage.

## 2017-03-26 NOTE — Progress Notes (Signed)
Internal Medicine Clinic Attending  Case discussed with Dr. Rivet soon after the resident saw the patient.  We reviewed the resident's history and exam and pertinent patient test results.  I agree with the assessment, diagnosis, and plan of care documented in the resident's note.  

## 2017-04-14 ENCOUNTER — Ambulatory Visit: Payer: Medicare Other

## 2017-04-22 DIAGNOSIS — H209 Unspecified iridocyclitis: Secondary | ICD-10-CM | POA: Diagnosis not present

## 2017-05-03 NOTE — Addendum Note (Signed)
Addendum  created 05/03/17 1344 by Chief Walkup, MD   Sign clinical note    

## 2017-05-03 NOTE — Anesthesia Postprocedure Evaluation (Signed)
Anesthesia Post Note  Patient: Amy Jarvis  Procedure(s) Performed: Procedure(s) (LRB): COLONOSCOPY WITH PROPOFOL (N/A)     Anesthesia Post Evaluation  Last Vitals:  Vitals:   03/23/17 1035 03/23/17 1045  BP: (!) 208/104 (!) 182/86  Pulse: 68   Resp: (!) 25   Temp:      Last Pain:  Vitals:   03/23/17 1025  TempSrc: Oral                 Rohnan Bartleson S

## 2017-05-05 ENCOUNTER — Encounter: Payer: Medicare Other | Admitting: Internal Medicine

## 2017-05-11 ENCOUNTER — Encounter: Payer: Self-pay | Admitting: *Deleted

## 2017-05-19 ENCOUNTER — Encounter: Payer: Medicare Other | Admitting: Internal Medicine

## 2017-05-26 ENCOUNTER — Ambulatory Visit (HOSPITAL_COMMUNITY)
Admission: RE | Admit: 2017-05-26 | Discharge: 2017-05-26 | Disposition: A | Discharge status: Home / Self Care | Payer: Medicare Other | Source: Ambulatory Visit | Attending: Ophthalmology | Admitting: Ophthalmology

## 2017-05-26 ENCOUNTER — Other Ambulatory Visit (INDEPENDENT_AMBULATORY_CARE_PROVIDER_SITE_OTHER): Payer: Medicare Other

## 2017-05-26 ENCOUNTER — Other Ambulatory Visit (HOSPITAL_COMMUNITY): Payer: Self-pay | Admitting: Ophthalmology

## 2017-05-26 DIAGNOSIS — H2000 Unspecified acute and subacute iridocyclitis: Secondary | ICD-10-CM

## 2017-05-26 DIAGNOSIS — H578 Other specified disorders of eye and adnexa: Secondary | ICD-10-CM | POA: Diagnosis not present

## 2017-05-26 LAB — URINALYSIS, ROUTINE W REFLEX MICROSCOPIC
Bilirubin Urine: NEGATIVE
Glucose, UA: NEGATIVE mg/dL
Hgb urine dipstick: NEGATIVE
KETONES UR: NEGATIVE mg/dL
LEUKOCYTES UA: NEGATIVE
NITRITE: NEGATIVE
PH: 6.5 (ref 5.0–8.0)
PROTEIN: NEGATIVE mg/dL
Specific Gravity, Urine: 1.01 (ref 1.005–1.030)

## 2017-05-26 NOTE — Addendum Note (Signed)
Addended by: Truddie Crumble on: 05/26/2017 03:19 PM   Modules accepted: Orders

## 2017-05-31 LAB — QUANTIFERON IN TUBE
QFT TB AG MINUS NIL VALUE: 0 [IU]/mL
QUANTIFERON MITOGEN VALUE: 6.03 IU/mL
QUANTIFERON TB AG VALUE: 0.12 [IU]/mL
QUANTIFERON TB GOLD: NEGATIVE
Quantiferon Nil Value: 0.12 IU/mL

## 2017-05-31 LAB — QUANTIFERON TB GOLD ASSAY (BLOOD)

## 2017-06-01 LAB — FLUORESCENT TREPONEMAL AB(FTA)-IGG-BLD: FLUORESCENT TREPONEMAL AB, IGG: NONREACTIVE

## 2017-06-01 LAB — ANTINUCLEAR ANTIBODIES, IFA: ANA TITER 1: POSITIVE — AB

## 2017-06-01 LAB — RPR: RPR Ser Ql: NONREACTIVE

## 2017-06-01 LAB — MPO/PR-3 (ANCA) ANTIBODIES
ANCA Proteinase 3: <3.5 U/mL (ref 0.0–3.5)
Myeloperoxidase Ab: <9 U/mL (ref 0.0–9.0)

## 2017-06-01 LAB — LYME AB/WESTERN BLOT REFLEX: Lyme IgG/IgM Ab: <0.91 {ISR} (ref 0.00–0.90)

## 2017-06-01 LAB — ANGIOTENSIN CONVERTING ENZYME: ANGIO CONVERT ENZYME: 30 U/L (ref 14–82)

## 2017-06-01 LAB — RHEUMATOID FACTOR: Rhuematoid fact SerPl-aCnc: <10 IU/mL (ref 0.0–13.9)

## 2017-06-01 LAB — FANA STAINING PATTERNS
Homogeneous Pattern: 1:80 {titer}
Speckled Pattern: 1:80 {titer}

## 2017-06-01 LAB — HLA-B27 ANTIGEN: HLA B27: NEGATIVE

## 2017-06-01 NOTE — Progress Notes (Addendum)
06-01-2017 14:23 Final lab report faxed to Dr Marshall Cork HLA B 27 complete.  Maryan Rued, [place] collected 05-26-17     Orders per Dr Marshall Cork  Preliminary Lab results faxed to Dr Marshall Cork [phone] 06-01-2017 10:15  I will fax a Final Report as soon as HLA B 27 results are available.  [facility]

## 2017-06-03 DIAGNOSIS — H209 Unspecified iridocyclitis: Secondary | ICD-10-CM | POA: Diagnosis not present

## 2017-06-17 DIAGNOSIS — H43393 Other vitreous opacities, bilateral: Secondary | ICD-10-CM | POA: Diagnosis not present

## 2017-06-17 DIAGNOSIS — H209 Unspecified iridocyclitis: Secondary | ICD-10-CM | POA: Diagnosis not present

## 2017-06-25 ENCOUNTER — Ambulatory Visit
Admission: RE | Admit: 2017-06-25 | Discharge: 2017-06-25 | Disposition: A | Discharge status: Home / Self Care | Payer: Medicare Other | Source: Ambulatory Visit | Attending: Internal Medicine | Admitting: Internal Medicine

## 2017-06-25 DIAGNOSIS — Z1231 Encounter for screening mammogram for malignant neoplasm of breast: Secondary | ICD-10-CM

## 2017-07-07 DIAGNOSIS — H209 Unspecified iridocyclitis: Secondary | ICD-10-CM | POA: Diagnosis not present

## 2017-07-23 ENCOUNTER — Encounter: Payer: Self-pay | Admitting: Internal Medicine

## 2017-07-23 ENCOUNTER — Ambulatory Visit (INDEPENDENT_AMBULATORY_CARE_PROVIDER_SITE_OTHER): Payer: Medicare Other | Admitting: Internal Medicine

## 2017-07-23 VITALS — BP 151/73 | HR 73 | Temp 97.9°F | Wt 321.4 lb

## 2017-07-23 DIAGNOSIS — H9393 Unspecified disorder of ear, bilateral: Secondary | ICD-10-CM

## 2017-07-23 DIAGNOSIS — I10 Essential (primary) hypertension: Secondary | ICD-10-CM | POA: Diagnosis not present

## 2017-07-23 DIAGNOSIS — Z8249 Family history of ischemic heart disease and other diseases of the circulatory system: Secondary | ICD-10-CM

## 2017-07-23 DIAGNOSIS — Z79899 Other long term (current) drug therapy: Secondary | ICD-10-CM

## 2017-07-23 DIAGNOSIS — H9390 Unspecified disorder of ear, unspecified ear: Secondary | ICD-10-CM

## 2017-07-23 MED ORDER — LABETALOL HCL 100 MG PO TABS: 100.0000 mg | ORAL_TABLET | Freq: Two times a day (BID) | ORAL | 0 refills | Status: DC

## 2017-07-23 MED ORDER — AMLODIPINE BESYLATE 10 MG PO TABS: 10.0000 mg | ORAL_TABLET | Freq: Every day | ORAL | 0 refills | Status: DC

## 2017-07-23 NOTE — Assessment & Plan Note (Addendum)
History of present illness Patient is complaining of ringing in both of her ears and muffled sounds in the right ear for the past few weeks. Denies noticing any drainage from her ears. States she uses Q-tips to clean them. Denies any history of trauma to her ears. Reports having chronic dizziness from vertigo and symptoms have been not been worse in the past few weeks. Denies having any headaches, sneezing, rhinorrhea, sore throat, shortness of breath, sinus pressure, fevers, or chills.  Assessment Unclear etiology of patient's current symptoms. There is questionable mild bulging of th tympanic membranes but no erythema, effusion, or perforation noted. Tympanic membranes appear pearly white and light reflex is present bilaterally. There is no cerumen impaction.  Plan -Referral to ENT -Advised her to avoid using Q-tips to clean her ears. If needed, she may use over-the-counter Debrox.

## 2017-07-23 NOTE — Patient Instructions (Addendum)
Amy Jarvis it was nice meeting today.  I have referred you to ENT for the problem in your ears.  For your high blood pressure:  -Stop taking nifedipine  -Take losartan, labetalol, and amlodipine as instructed.   -Losartan raises the potassium level in the body. Do not take any additional potassium supplements.  -Amlodipine causes swelling in the legs. Please keep your legs elevated when sitting.

## 2017-07-23 NOTE — Assessment & Plan Note (Addendum)
Uncontrolled hypertension. Blood pressure continues to be elevated (151/73 at this visit). Per prior notes, her medication regimen should include losartan 100 mg daily, nifedipine 30 mg daily, and carvedilol 6.25 mg twice daily. Patient has been taking her medications differently. She stopped taking carvedilol as she believes it caused her to have headaches. Instead, she re-started amlodipine 10 mg daily. She continues to take losartan.  Patient is not able to take diuretics due to history of overactive bladder for which she is currently on Myrbetriq. Explained to her that it is best to avoid taking 2 calcium channel blockers. Patient noted to have trace bilateral pedal edema but she did not seem to be too concerned about it. Explained to her that this is likely a side effect of her amlodipine and that reducing the dose might be an option. However, patient stated that the lower dose of amlodipine is not effective in controlling her blood pressure.   Plan -Stop nifedipine -Continue losartan 100 mg daily -Continue amlodipine 10 mg daily -Start labetalol 100 mg twice daily as it may be more effective in controlling her hypertension compared to carvedilol -Advised her to keep her legs elevated when sitting to prevent worsening lower extremity edema. -Potassium supplement was noted on her medication list. Patient stated she takes it only on occasion for leg cramps. Advised her to not take any potassium supplements she since she is currently on losartan. If she experiences any leg cramps in the future, she should return to the clinic so that we can check her labs. -Return to the clinic in 1 month for blood pressure recheck.

## 2017-07-23 NOTE — Progress Notes (Signed)
   CC: Problem with ears  HPI:  [name] is a 56 y.o. female with a past medical history of conditions listed below presenting to the clinic complaining of a problem with her ears. Hypertension was also discussed during this visit. Please see problem based charting for the status of the patient's current and chronic medical conditions.   Past Medical History:  Diagnosis Date  . Allergic rhinitis 09/23/2015  . Carpal tunnel syndrome, bilateral   . Cervical polyp    possible history of cervical polyp  . Complication of anesthesia    low bp after hysterctomy  . Cystocele 01/13/2010  . DDD (degenerative disc disease), lumbar   . Degenerative disk disease 10/07/2012  . Degenerative lumbar spinal stenosis   . Diverticulosis 03/26/2010  . GERD 09/05/2009  . Hyperlipidemia LDL goal < 130 09/25/2008  . Hypertension 09/25/2008  . Microscopic hematuria 01/13/2010  . Morbid obesity 08/28/2010  . Obstructive sleep apnea on CPAP 06/20/2010   cpap   . Plantar fasciitis   . Pneumonia    6/16  . Uterine prolapse 11/19/2008  . Vertigo 10/07/2012   Review of Systems: Pertinent positives mentioned in HPI. Remainder of all ROS negative.   Physical Exam:  Vitals:   07/23/17 1433  BP: (!) 151/73  Pulse: 73  Temp: 97.9 F (36.6 C)  TempSrc: Oral  SpO2: 100%  Weight: (!) 321 lb 6.4 oz (145.8 kg)   Physical Exam  Constitutional: She is oriented to person, place, and time. She appears well-developed and well-nourished. No distress.  HENT:  Head: Normocephalic and atraumatic.  Right Ear: External ear normal.  Left Ear: External ear normal.  Mouth/Throat: Oropharynx is clear and moist.  Ears bilaterally: No cerumen impaction. Tympanic membranes appear pearly white and light reflex present. There is questionable mild bulging of the tympanic membranes. No erythema, perforation, or effusion noted.  Eyes: Right eye exhibits no discharge. Left eye exhibits no discharge.  Cardiovascular:  Normal rate, regular rhythm and intact distal pulses.  Exam reveals no friction rub.   Pulmonary/Chest: Effort normal and breath sounds normal. No respiratory distress. She has no wheezes. She has no rales.  Abdominal: Soft. Bowel sounds are normal. She exhibits no distension. There is no tenderness.  Musculoskeletal:  Trace bilateral pedal edema  Neurological: She is alert and oriented to person, place, and time.  Skin: Skin is warm and dry.    Assessment & Plan:   See Encounters Tab for problem based charting.  Patient discussed with Dr. Angelia Mould

## 2017-07-25 NOTE — Progress Notes (Signed)
Internal Medicine Clinic Attending  I saw and evaluated the patient.  I personally confirmed the key portions of the history and exam documented by Dr. Rathore and I reviewed pertinent patient test results.  The assessment, diagnosis, and plan were formulated together and I agree with the documentation in the resident's note.  

## 2017-08-04 ENCOUNTER — Telehealth: Payer: Self-pay

## 2017-08-04 NOTE — Telephone Encounter (Signed)
Requesting to speak with a nurse about meds. Please call pt back.  

## 2017-08-04 NOTE — Telephone Encounter (Signed)
Rtc, pt wanted HCTZ, she was informed she would need to be seen, med was stopped in 2016. Offered appt tomorrow, she states she will call back for an appt, this is just an [place]

## 2017-08-05 DIAGNOSIS — M2669 Other specified disorders of temporomandibular joint: Secondary | ICD-10-CM | POA: Diagnosis not present

## 2017-08-05 DIAGNOSIS — H9311 Tinnitus, right ear: Secondary | ICD-10-CM | POA: Diagnosis not present

## 2017-08-05 DIAGNOSIS — H905 Unspecified sensorineural hearing loss: Secondary | ICD-10-CM | POA: Diagnosis not present

## 2017-08-05 DIAGNOSIS — H903 Sensorineural hearing loss, bilateral: Secondary | ICD-10-CM | POA: Insufficient documentation

## 2017-08-05 DIAGNOSIS — Z0110 Encounter for examination of ears and hearing without abnormal findings: Secondary | ICD-10-CM | POA: Diagnosis not present

## 2017-08-20 ENCOUNTER — Ambulatory Visit (INDEPENDENT_AMBULATORY_CARE_PROVIDER_SITE_OTHER): Payer: Medicare Other | Admitting: Internal Medicine

## 2017-08-20 VITALS — BP 127/69 | HR 78 | Temp 98.0°F | Ht 67.0 in | Wt 321.8 lb

## 2017-08-20 DIAGNOSIS — Z23 Encounter for immunization: Secondary | ICD-10-CM | POA: Diagnosis not present

## 2017-08-20 DIAGNOSIS — Z79899 Other long term (current) drug therapy: Secondary | ICD-10-CM | POA: Diagnosis not present

## 2017-08-20 DIAGNOSIS — G4733 Obstructive sleep apnea (adult) (pediatric): Secondary | ICD-10-CM | POA: Diagnosis not present

## 2017-08-20 DIAGNOSIS — M722 Plantar fascial fibromatosis: Secondary | ICD-10-CM | POA: Diagnosis not present

## 2017-08-20 DIAGNOSIS — N3281 Overactive bladder: Secondary | ICD-10-CM | POA: Diagnosis not present

## 2017-08-20 DIAGNOSIS — I10 Essential (primary) hypertension: Secondary | ICD-10-CM | POA: Diagnosis not present

## 2017-08-20 DIAGNOSIS — Z9989 Dependence on other enabling machines and devices: Secondary | ICD-10-CM | POA: Diagnosis not present

## 2017-08-20 DIAGNOSIS — M1990 Unspecified osteoarthritis, unspecified site: Secondary | ICD-10-CM

## 2017-08-20 MED ORDER — LOSARTAN POTASSIUM-HCTZ 100-25 MG PO TABS: 1.0000 | ORAL_TABLET | Freq: Every day | ORAL | 3 refills | Status: DC

## 2017-08-20 MED ORDER — CYCLOBENZAPRINE HCL 10 MG PO TABS: 10.0000 mg | ORAL_TABLET | Freq: Two times a day (BID) | ORAL | 3 refills | Status: DC | PRN

## 2017-08-20 NOTE — Assessment & Plan Note (Addendum)
At last visit patient's hypertension regimen was changed to losartan 100mg  daily, amlodipine 10mg  daily, and labetalol 100mg  BID. Patient states that she developed one episode of increased vertigo once she started the labetalol so has since not taken it. She is also having inc bil LE edema since starting amlodipine (her preference), so she has stopped taking losartan 100mg  daily and started taking losartan-HCTZ 100-25mg  daily of which her daughter had extra. She would like a prescription for this. This has helped her lower extremity swelling. She denies chest pain, shortness of breath, headaches, new vision changes or hearing changes.  BP well controlled today.  Plan: --d/c labetalol, losartan --continue amlodipine 10mg  daily --prescribe losartan-HCTZ 100-25mg  daily --advised patient of risk of hyperkalemia with supplementation in addition to losartan. She agrees to return to clinic if her cramps are more frequent requiring her to take potassium more often for work up of her myalgias and bmet. --f/u Bmet today since she has already started HCTZ

## 2017-08-20 NOTE — Patient Instructions (Signed)
For your blood pressure: Take amlodipine 10mg  daily Take losartan-HCTZ 100-25mg  daily  I will check your blood work today.

## 2017-08-20 NOTE — Assessment & Plan Note (Signed)
Patient compliant with exercises. Refilled flexeril today.

## 2017-08-20 NOTE — Progress Notes (Signed)
   CC: blood pressure  HPI:  [name] is a 56 y.o. with a PMH of HTN, Vertigo, OSA on CPAP, DJD presenting to clinic for follow up on her blood pressure.  At last visit patient's hypertension regimen was changed to losartan 100mg  daily, amlodipine 10mg  daily, and labetalol 100mg  BID. Patient states that she developed one episode of increased vertigo once she started the labetalol so has since not taken it. She is also having inc bil LE edema since starting amlodipine (her preference), so she has stopped taking losartan 100mg  daily and started taking losartan-HCTZ 100-25mg  daily of which her daughter had extra. She would like a prescription for this. This has helped her lower extremity swelling. She denies chest pain, shortness of breath, headaches, new vision changes or hearing changes.   Patient also has intermittent leg cramps which are relieved within 1 minute of taking a 68mEq OTC potassium supplement; she takes this maybe once every two weeks.   Patient also has plantar fasciitis for which she does regular stretching exercises. She occasionally will take flexeril when her pain is exacerbated and this provides her with relief.  She is no longer taking Mybetriq for overactive bladder and has not noticed a worsening of her symptoms even with starting HCTZ.  Please see problem based Assessment and Plan for status of patients chronic conditions.  Past Medical History:  Diagnosis Date  . Allergic rhinitis 09/23/2015  . Carpal tunnel syndrome, bilateral   . Cervical polyp    possible history of cervical polyp  . Complication of anesthesia    low bp after hysterctomy  . Cystocele 01/13/2010  . DDD (degenerative disc disease), lumbar   . Degenerative disk disease 10/07/2012  . Degenerative lumbar spinal stenosis   . Diverticulosis 03/26/2010  . GERD 09/05/2009  . Hyperlipidemia LDL goal < 130 09/25/2008  . Hypertension 09/25/2008  . Microscopic hematuria 01/13/2010  . Morbid  obesity 08/28/2010  . Obstructive sleep apnea on CPAP 06/20/2010   cpap   . Plantar fasciitis   . Pneumonia    6/16  . Uterine prolapse 11/19/2008  . Vertigo 10/07/2012    Review of Systems:   ROS Per HPI  Physical Exam:  Vitals:   08/20/17 1033  BP: 127/69  Pulse: 78  Temp: 98 F (36.7 C)  TempSrc: Oral  SpO2: 100%  Weight: (!) 321 lb 12.8 oz (146 kg)  Height: 5\' 7"  (1.702 m)   GENERAL- alert, co-operative, appears as stated age, not in any distress. HEENT- Atraumatic, normocephalic, EOMI, oral mucosa appears moist CARDIAC- RRR, no murmurs, rubs or gallops. RESP- Moving equal volumes of air, and clear to auscultation bilaterally, no wheezes or crackles. ABDOMEN- Soft, nontender, bowel sounds present. NEURO- No obvious Cr N abnormality. EXTREMITIES- pulse 2+, symmetric, mild bilateral dependent edema. SKIN- Warm, dry, no rash or lesion. PSYCH- Normal mood and affect, appropriate thought content and speech.  Assessment & Plan:   See Encounters Tab for problem based charting.   Patient discussed with Dr. Nilsa Nutting, MD Internal Medicine PGY2

## 2017-08-21 LAB — BMP8+ANION GAP
ANION GAP: 17 mmol/L (ref 10.0–18.0)
BUN/Creatinine Ratio: 16 (ref 9–23)
BUN: 11 mg/dL (ref 6–24)
CALCIUM: 9.9 mg/dL (ref 8.7–10.2)
CHLORIDE: 99 mmol/L (ref 96–106)
CO2: 26 mmol/L (ref 20–29)
Creatinine, Ser: 0.67 mg/dL (ref 0.57–1.00)
GFR calc non Af Amer: 99 mL/min/{1.73_m2} (ref >59)
GFR, EST AFRICAN AMERICAN: 114 mL/min/{1.73_m2} (ref >59)
GLUCOSE: 87 mg/dL (ref 65–99)
POTASSIUM: 3.8 mmol/L (ref 3.5–5.2)
Sodium: 142 mmol/L (ref 134–144)

## 2017-08-23 ENCOUNTER — Other Ambulatory Visit: Payer: Self-pay | Admitting: Internal Medicine

## 2017-08-23 DIAGNOSIS — K219 Gastro-esophageal reflux disease without esophagitis: Secondary | ICD-10-CM

## 2017-08-23 MED ORDER — ESOMEPRAZOLE MAGNESIUM 40 MG PO CPDR: DELAYED_RELEASE_CAPSULE | ORAL | 3 refills | Status: DC

## 2017-08-23 MED ORDER — GABAPENTIN 300 MG PO CAPS: 300.0000 mg | ORAL_CAPSULE | Freq: Every day | ORAL | 1 refills | Status: DC

## 2017-08-23 NOTE — Telephone Encounter (Signed)
Refill Request   Nexium  Gabapentin   Pt uses a new pharmacy [facility] @ [place].

## 2017-08-23 NOTE — Progress Notes (Signed)
Internal Medicine Clinic Attending  Case discussed with Dr. Svalina  at the time of the visit.  We reviewed the resident's history and exam and pertinent patient test results.  I agree with the assessment, diagnosis, and plan of care documented in the resident's note.  

## 2017-09-22 DIAGNOSIS — R768 Other specified abnormal immunological findings in serum: Secondary | ICD-10-CM | POA: Diagnosis not present

## 2017-09-22 DIAGNOSIS — H209 Unspecified iridocyclitis: Secondary | ICD-10-CM | POA: Diagnosis not present

## 2017-10-19 DIAGNOSIS — G4733 Obstructive sleep apnea (adult) (pediatric): Secondary | ICD-10-CM | POA: Diagnosis not present

## 2017-10-27 ENCOUNTER — Other Ambulatory Visit: Payer: Self-pay | Admitting: Internal Medicine

## 2017-11-01 DIAGNOSIS — H2513 Age-related nuclear cataract, bilateral: Secondary | ICD-10-CM | POA: Diagnosis not present

## 2017-11-01 DIAGNOSIS — H25013 Cortical age-related cataract, bilateral: Secondary | ICD-10-CM | POA: Diagnosis not present

## 2017-11-01 DIAGNOSIS — H43393 Other vitreous opacities, bilateral: Secondary | ICD-10-CM | POA: Diagnosis not present

## 2017-11-01 DIAGNOSIS — H40013 Open angle with borderline findings, low risk, bilateral: Secondary | ICD-10-CM | POA: Diagnosis not present

## 2017-12-01 ENCOUNTER — Encounter: Payer: Medicare Other | Admitting: Internal Medicine

## 2017-12-03 ENCOUNTER — Other Ambulatory Visit: Payer: Self-pay

## 2017-12-03 DIAGNOSIS — R42 Dizziness and giddiness: Secondary | ICD-10-CM

## 2017-12-03 MED ORDER — MECLIZINE HCL 25 MG PO TABS: ORAL_TABLET | ORAL | 2 refills | Status: DC

## 2017-12-03 NOTE — Telephone Encounter (Signed)
meclizine (ANTIVERT) 25 MG tablet   Refill request @ [facility] on [address].

## 2017-12-03 NOTE — Telephone Encounter (Signed)
Patient is calling back, haven't heard from a nurse. Patient is suffering with vertigo. She really needs her medicine

## 2017-12-16 ENCOUNTER — Telehealth: Payer: Self-pay

## 2017-12-16 NOTE — Telephone Encounter (Signed)
Inform pt potassium is not on current med list; states she does not take it everyday. But when she was started on Losartan-HCTZ; she will need b/c of diuretic "it would flush me out". It was discontinued 8/24 per Dr Marlowe Sax per chart. Last BMP was 9/21 K+ 3.8. Has an appt to see Dr Aggie Hacker 01/05/18.

## 2017-12-16 NOTE — Telephone Encounter (Signed)
Attempted to contact pt to confirm request for potassium as medication is no longer on pt's medication list.  No answer, will await call back from patient.Despina Hidden Cassady1/17/201912:20 PM

## 2017-12-16 NOTE — Telephone Encounter (Signed)
Patient missed call, pls call patient

## 2017-12-16 NOTE — Telephone Encounter (Signed)
Requesting Potassium med to be filled. Please call pt back.

## 2017-12-21 ENCOUNTER — Other Ambulatory Visit: Payer: Self-pay | Admitting: *Deleted

## 2017-12-21 DIAGNOSIS — L309 Dermatitis, unspecified: Secondary | ICD-10-CM

## 2017-12-21 DIAGNOSIS — J309 Allergic rhinitis, unspecified: Secondary | ICD-10-CM

## 2017-12-21 MED ORDER — FLUTICASONE PROPIONATE 50 MCG/ACT NA SUSP: 1.0000 | Freq: Every day | NASAL | 3 refills | Status: DC

## 2017-12-21 MED ORDER — HYDROCORTISONE 2.5 % EX LOTN: 1.0000 "application " | TOPICAL_LOTION | Freq: Two times a day (BID) | CUTANEOUS | 1 refills | Status: DC | PRN

## 2017-12-27 NOTE — Telephone Encounter (Signed)
Patient's last metabolic panel was within normal limits. I would prefer to wait for her next appointment and check labs at that time. If her potassium is low I will gladly refill, but will hold off on refilling her potassium. Losartan can cause hyperkalemia and she would be at risk with additional supplementation.

## 2017-12-27 NOTE — Telephone Encounter (Signed)
Will not refill potassium at this time. Please see encounter note. Thanks!

## 2017-12-27 NOTE — Telephone Encounter (Signed)
Please advise. Thanks.  

## 2017-12-28 NOTE — Telephone Encounter (Signed)
Pt aware.Despina Hidden Cassady1/29/20199:56 AM

## 2018-01-05 ENCOUNTER — Other Ambulatory Visit: Payer: Self-pay

## 2018-01-05 ENCOUNTER — Encounter: Payer: Self-pay | Admitting: Internal Medicine

## 2018-01-05 ENCOUNTER — Ambulatory Visit (INDEPENDENT_AMBULATORY_CARE_PROVIDER_SITE_OTHER): Payer: Medicare Other | Admitting: Internal Medicine

## 2018-01-05 VITALS — BP 139/64 | HR 90 | Temp 98.2°F | Ht 67.0 in | Wt 327.4 lb

## 2018-01-05 DIAGNOSIS — G5603 Carpal tunnel syndrome, bilateral upper limbs: Secondary | ICD-10-CM | POA: Diagnosis not present

## 2018-01-05 DIAGNOSIS — I10 Essential (primary) hypertension: Secondary | ICD-10-CM | POA: Diagnosis not present

## 2018-01-05 DIAGNOSIS — G894 Chronic pain syndrome: Secondary | ICD-10-CM

## 2018-01-05 DIAGNOSIS — M722 Plantar fascial fibromatosis: Secondary | ICD-10-CM

## 2018-01-05 DIAGNOSIS — Z131 Encounter for screening for diabetes mellitus: Secondary | ICD-10-CM

## 2018-01-05 DIAGNOSIS — G8929 Other chronic pain: Secondary | ICD-10-CM

## 2018-01-05 LAB — GLUCOSE, CAPILLARY: Glucose-Capillary: 85 mg/dL (ref 65–99)

## 2018-01-05 LAB — POCT GLYCOSYLATED HEMOGLOBIN (HGB A1C): Hemoglobin A1C: 5.8

## 2018-01-05 MED ORDER — IBUPROFEN 800 MG PO TABS: 800.0000 mg | ORAL_TABLET | Freq: Every evening | ORAL | 0 refills | Status: DC | PRN

## 2018-01-05 MED ORDER — DICLOFENAC SODIUM 1 % TD GEL: 4.0000 g | Freq: Four times a day (QID) | TRANSDERMAL | 3 refills | Status: DC

## 2018-01-05 NOTE — Assessment & Plan Note (Addendum)
Patient's current regimen includes: Amlodipine 10 mg and Hyzaar 100-25 mg daily. She was previously on labetalol 100 mg BID but had increased vertigo and stopped taking. BMET 07/2017 was within normal limits. Will recheck BMET today as patient concerned about hypokalemia and was requesting refill of potassium supplementation. Patient having no side effects of medication. Vertigo resolved after stopping labetalol. Patient states she checks her BP at home and it typically is 120/70s.   BP well controlled on current regimen.  BP Readings from Last 3 Encounters:  01/05/18 139/64  08/20/17 127/69  07/23/17 (!) 151/73    Plan: -Continue Amlodipine 10 mg -Continue Hyzaar 100-25 mg -F/u BMET  Addendum 2/7: BMET WNL - potassium 3.4; will call patient to notify results and recommend potassium supplementation x 1 day

## 2018-01-05 NOTE — Assessment & Plan Note (Addendum)
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 Was evaluated by neurology with EMG in 2017. Patient has chronic tingling and numbness, as well as pain at night in both hands. She also is complaining of weakness, and describes difficulty with opening water bottles, putting on earrings, and buttoning clothes. She wears bilateral splints at night without improvement in her symptoms, also on 300 mg gabapentin daily. Neuro exam was unremarkable. Discussed orthopedic referral with patient. She is agreeable.    Plan: -Patient may benefit from CTS release sx; will refer to orthopedic surgery

## 2018-01-05 NOTE — Patient Instructions (Addendum)
Ms. Kentner,   It was a pleasure meeting you today.   I have refilled the medications you have requested. I suggest picking up Magnesium supplements over the counter at the pharmacy and see if those help your leg cramps, take them as needed. I have also refilled your potassium, please only take as needed for cramps.   If you acid reflux is uncontrolled you can try Zantec over the counter. You can take this in addition to Nexium.   Your blood pressure is well controlled on your current medications. I have made no changes.   For you carpal tunnel and foot pain, I have referred you to Orthopedics. You should be hearing from their office soon to schedule an appointment.   I will call you with your lab results.    FOLLOW-UP INSTRUCTIONS When:  6 months or sooner if needed For: blood pressure and carpal tunnel  What to bring: current medications

## 2018-01-05 NOTE — Assessment & Plan Note (Signed)
Chronic and stable. Pain improved wit ibuprofen. Discussed with patient that she may benefit from a podiatry referral and steroid injection. I have referred the patient to orthopedic surgery today. Discussed with patient to discuss her plantar fasciitis symptoms with the orthopedic provider she sees. If ortho recommends podiatry referral I advised patient to call the clinic and I will gladly refer her to podiatry.

## 2018-01-05 NOTE — Assessment & Plan Note (Signed)
Patient states symptoms have resolved.  Plan: -Will continue to monitor

## 2018-01-05 NOTE — Assessment & Plan Note (Signed)
Patient has chronic low back pain. Well controlled with ibuprofen and voltaren gel.  Plan: Refilled Ibuprofen 800 mg qhs and Voltaren gel.

## 2018-01-05 NOTE — Assessment & Plan Note (Addendum)
Patient with obesity and hypertension. Last A1C checked was 2014. Point of care A1C checked today due to risk factors. A1C 5.8.

## 2018-01-05 NOTE — Progress Notes (Signed)
   CC: hypertension, carpal tunnel  HPI:  [name] is a 57 y.o. with past medical history as documented below presenting for follow up of her hypertension. Please see encounter based charting for an in depth description of the patient's acute and chronic medical problems.   Past Medical History:  Diagnosis Date  . Allergic rhinitis 09/23/2015  . Carpal tunnel syndrome, bilateral   . Cervical polyp    possible history of cervical polyp  . Complication of anesthesia    low bp after hysterctomy  . Cystocele 01/13/2010  . DDD (degenerative disc disease), lumbar   . Degenerative disk disease 10/07/2012  . Degenerative lumbar spinal stenosis   . Diverticulosis 03/26/2010  . GERD 09/05/2009  . Hyperlipidemia LDL goal < 130 09/25/2008  . Hypertension 09/25/2008  . Microscopic hematuria 01/13/2010  . Morbid obesity 08/28/2010  . Obstructive sleep apnea on CPAP 06/20/2010   cpap   . Plantar fasciitis   . Pneumonia    6/16  . Uterine prolapse 11/19/2008  . Vertigo 10/07/2012   Review of Systems:   Review of Systems  Constitutional: Negative for chills and fever.  HENT: Negative for tinnitus.   Eyes: Negative for photophobia and pain.  Respiratory: Negative for shortness of breath.   Cardiovascular: Negative for chest pain.  Gastrointestinal: Positive for constipation. Negative for diarrhea, nausea and vomiting.  Genitourinary: Positive for frequency. Negative for dysuria.  Musculoskeletal: Positive for back pain and joint pain.  Neurological: Positive for tingling and focal weakness (hands b/l carpal tunnel).  Psychiatric/Behavioral: Negative.     Physical Exam:  Vitals:   01/05/18 1335  BP: 139/64  Pulse: 90  Temp: 98.2 F (36.8 C)  TempSrc: Oral  SpO2: 98%  Weight: (!) 327 lb 6.4 oz (148.5 kg)  Height: 5\' 7"  (1.702 m)   Physical Exam  Constitutional: She is oriented to person, place, and time. She appears well-developed and well-nourished. No distress.  HENT:    Head: Normocephalic and atraumatic.  Eyes: Conjunctivae are normal.  Neck: No thyromegaly present.  Cardiovascular: Normal rate, regular rhythm and normal heart sounds.  Pulmonary/Chest: Effort normal and breath sounds normal. She has no wheezes.  Abdominal: Soft. Bowel sounds are normal. There is no tenderness.  Musculoskeletal: Normal range of motion. She exhibits edema (+1 piiting edema of b/l LE, patients baseline).  Lymphadenopathy:    She has no cervical adenopathy.  Neurological: She is alert and oriented to person, place, and time.  Skin: Skin is warm and dry.  Psychiatric: She has a normal mood and affect.    Assessment & Plan:   See Encounters Tab for problem based charting.  Patient discussed with Dr. Lynnae January

## 2018-01-06 ENCOUNTER — Telehealth: Payer: Self-pay | Admitting: Internal Medicine

## 2018-01-06 LAB — BMP8+ANION GAP
Anion Gap: 17 mmol/L (ref 10.0–18.0)
BUN/Creatinine Ratio: 14 (ref 9–23)
BUN: 10 mg/dL (ref 6–24)
CO2: 27 mmol/L (ref 20–29)
Calcium: 9.8 mg/dL (ref 8.7–10.2)
Chloride: 97 mmol/L (ref 96–106)
Creatinine, Ser: 0.69 mg/dL (ref 0.57–1.00)
GFR, EST AFRICAN AMERICAN: 112 mL/min/{1.73_m2} (ref >59)
GFR, EST NON AFRICAN AMERICAN: 97 mL/min/{1.73_m2} (ref >59)
Glucose: 80 mg/dL (ref 65–99)
POTASSIUM: 3.4 mmol/L — AB (ref 3.5–5.2)
Sodium: 141 mmol/L (ref 134–144)

## 2018-01-06 NOTE — Telephone Encounter (Signed)
Patient seen yesterday physician was sending potassium to the pharmacy, patient is calling checking on that

## 2018-01-07 MED ORDER — POTASSIUM CHLORIDE CRYS ER 20 MEQ PO TBCR: 20.0000 meq | EXTENDED_RELEASE_TABLET | Freq: Every day | ORAL | 0 refills | Status: DC | PRN

## 2018-01-07 NOTE — Progress Notes (Signed)
Internal Medicine Clinic Attending  Case discussed with Dr. LaCroce at the time of the visit.  We reviewed the resident's history and exam and pertinent patient test results.  I agree with the assessment, diagnosis, and plan of care documented in the resident's note.  

## 2018-01-07 NOTE — Telephone Encounter (Signed)
Patient states she is still waiting on potassium refill.  Will send to MD for review.Amy Hidden Cassady2/8/20192:41 PM

## 2018-01-07 NOTE — Telephone Encounter (Signed)
Rx sent to pharmacy   

## 2018-01-07 NOTE — Telephone Encounter (Signed)
Potassium was sent to pharmacy. Is she inquiring about lab results?

## 2018-01-13 ENCOUNTER — Telehealth: Payer: Self-pay | Admitting: *Deleted

## 2018-01-13 NOTE — Telephone Encounter (Addendum)
Call to [facility] for Prior Authorization for Diclofenac Gel 1%.  Approved starting 01/13/2018- 11/29/2018.  [place] [id number].  [place] was called and notified of.   Sander Nephew, RN 01/13/2018 3:37 PM.

## 2018-01-24 ENCOUNTER — Other Ambulatory Visit: Payer: Self-pay | Admitting: Internal Medicine

## 2018-01-24 DIAGNOSIS — Z124 Encounter for screening for malignant neoplasm of cervix: Secondary | ICD-10-CM | POA: Diagnosis not present

## 2018-01-24 DIAGNOSIS — Z01419 Encounter for gynecological examination (general) (routine) without abnormal findings: Secondary | ICD-10-CM | POA: Diagnosis not present

## 2018-01-24 DIAGNOSIS — N958 Other specified menopausal and perimenopausal disorders: Secondary | ICD-10-CM | POA: Diagnosis not present

## 2018-01-24 DIAGNOSIS — Z13820 Encounter for screening for osteoporosis: Secondary | ICD-10-CM | POA: Diagnosis not present

## 2018-01-24 DIAGNOSIS — R29890 Loss of height: Secondary | ICD-10-CM | POA: Diagnosis not present

## 2018-01-31 ENCOUNTER — Encounter (INDEPENDENT_AMBULATORY_CARE_PROVIDER_SITE_OTHER): Payer: Self-pay | Admitting: Orthopedic Surgery

## 2018-01-31 ENCOUNTER — Ambulatory Visit (INDEPENDENT_AMBULATORY_CARE_PROVIDER_SITE_OTHER): Payer: Medicare Other | Admitting: Orthopedic Surgery

## 2018-01-31 VITALS — Ht 67.0 in | Wt 327.0 lb

## 2018-01-31 DIAGNOSIS — G5603 Carpal tunnel syndrome, bilateral upper limbs: Secondary | ICD-10-CM | POA: Diagnosis not present

## 2018-01-31 DIAGNOSIS — M7662 Achilles tendinitis, left leg: Secondary | ICD-10-CM

## 2018-01-31 DIAGNOSIS — M722 Plantar fascial fibromatosis: Secondary | ICD-10-CM

## 2018-01-31 DIAGNOSIS — M7661 Achilles tendinitis, right leg: Secondary | ICD-10-CM

## 2018-01-31 NOTE — Progress Notes (Signed)
Office Visit Note   Patient: Amy Jarvis           Date of Birth: 1961-01-30           MRN: [id number] Visit Date: 01/31/2018              Requested by: Melanee Spry, MD [address] [place], [place] [address] PCP: Melanee Spry, MD  Chief Complaint  Patient presents with  . Right Wrist - Pain  . Left Wrist - Pain  . Right Foot - Pain  . Left Foot - Pain      HPI: Patient is a 57 year old woman who presents for evaluation for chronic bilateral carpal tunnel syndrome as well as chronic plantar fasciitis with no Achilles tendinitis bilaterally.  Patient states she has tried orthotics and fracture boot without relief.  She states she is currently taking ibuprofen 800 mg 3 times a day.  Patient states that the carpal tunnel syndrome has been present for over 2-1/2 years she did have nerve conduction studies which were positive for carpal tunnel syndrome about 2 and half years ago.  She states she wears a brace at night takes Neurontin 300 mg at night all without relief still has numbness burning and tingling in her thumb index and long finger which is worse at night.  Patient complains of increasing Achilles tendinitis as well as plantar fasciitis bilaterally.  She also has some peroneal tendinitis on the right.  Assessment & Plan: Visit Diagnoses:  1. Carpal tunnel syndrome, bilateral   2. Achilles tendonitis, bilateral   3. Plantar fasciitis, bilateral     Plan: Patient was given instructions for heel cord stretching to help with the plantar fasciitis and Achilles tendinitis.  Discussed that if stretching and anti-inflammatories do not work we could consider a gastrocnemius recession and plantar fascial release which would be to perform one foot at a time.  Due to her persistent carpal tunnel symptoms despite bracing Neurontin and anti-inflammatories patient states she may like to consider carpal tunnel release again discussed risks and benefits of surgery risk of  persistent numbness and burning due to the chronic nature of her symptoms.  Discussed that we could proceed with outpatient carpal tunnel surgery at her convenience she will call if she wishes to proceed with surgery.  Risks and benefits of surgery were discussed.  Follow-Up Instructions: Return if symptoms worsen or fail to improve.   Ortho Exam  Patient is alert, oriented, no adenopathy, well-dressed, normal affect, normal respiratory effort. Examination patient has a positive Phalen's and positive Tinel's test bilaterally.  She has decreased grip strength bilaterally.  Examination of both feet she is tender to palpation over the Achilles tendon there is no nodular changes.  She has some tenderness over the peroneal tendons on the right she has tenderness to palpation of the origin of the plantar fascia bilaterally she has dorsiflexion to neutral with heel cord contracture with her knee extended.  Imaging: No results found. No images are attached to the encounter.  Labs: Lab Results  Component Value Date   HGBA1C 5.8 01/05/2018   HGBA1C 5.6 11/16/2013    @LABSALLVALUES (HGBA1)@  Body mass index is 51.22 kg/m.  Orders:  No orders of the defined types were placed in this encounter.  No orders of the defined types were placed in this encounter.    Procedures: No procedures performed  Clinical Data: No additional findings.  ROS:  All other systems negative, except as noted in the  HPI. Review of Systems  Objective: Vital Signs: Ht 5\' 7"  (1.702 m)   Wt (!) 327 lb (148.3 kg)   LMP 03/31/2013   BMI 51.22 kg/m   Specialty Comments:  No specialty comments available.  PMFS History: Patient Active Problem List   Diagnosis Date Noted  . Carpal tunnel syndrome, bilateral 01/31/2018  . Achilles tendonitis, bilateral 01/31/2018  . Screening for diabetes mellitus 01/05/2018  . 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 Ear problem 07/23/2017  . Family history of colon cancer   . Benign neoplasm of transverse colon   . Plantar fasciitis, bilateral 09/23/2016  . S/P hysterectomy with oophorectomy 12/26/2015  . Carpal tunnel syndrome 11/11/2015  . Constipation 11/11/2015  . Eczema 11/11/2015  . Allergic rhinitis 09/23/2015  . Fatigue 05/22/2015  . Urinary incontinence 11/16/2013  . Lower leg edema, chronic  10/19/2012  . Routine health maintenance 10/07/2012  . Vertigo 10/07/2012  . Chronic pain syndrome 10/07/2012  . Acute non-recurrent maxillary sinusitis 01/20/2011  . Morbid obesity 08/28/2010  . Obstructive sleep apnea on CPAP 06/20/2010  . Diverticulosis 03/26/2010  . Microscopic hematuria 01/13/2010  . Perimenopausal 01/13/2010  . GERD 09/05/2009  . Hyperlipidemia with target LDL less than 130 09/25/2008  . Essential hypertension 09/25/2008   Past Medical History:  Diagnosis Date  . Allergic rhinitis 09/23/2015  . Carpal tunnel syndrome, bilateral   . Cervical polyp    possible history of cervical polyp  . Complication of anesthesia    low bp after hysterctomy  . Cystocele 01/13/2010  . DDD (degenerative disc disease), lumbar   . Degenerative disk disease 10/07/2012  . Degenerative lumbar spinal stenosis   . Diverticulosis 03/26/2010  . GERD 09/05/2009  . Hyperlipidemia LDL goal < 130 09/25/2008  . Hypertension 09/25/2008  . Microscopic hematuria 01/13/2010  . Morbid obesity 08/28/2010  . Obstructive sleep apnea on CPAP 06/20/2010   cpap   . Plantar fasciitis   . Pneumonia    6/16  . Uterine prolapse 11/19/2008  . Vertigo 10/07/2012    Family History  Problem Relation Age of Onset  . Colon cancer Mother 55  . Hypertension Mother   . Stroke Mother 51  . Breast cancer Other        maternal great-aunt  . Cancer Other        maternal great aunt with breast cancer  . Prostate cancer Brother   . Healthy Son   . Healthy Daughter      Past Surgical History:  Procedure Laterality Date  . CHOLECYSTECTOMY  2010  . COLONOSCOPY    . COLONOSCOPY WITH PROPOFOL N/A 03/23/2017   Procedure: COLONOSCOPY WITH PROPOFOL;  Surgeon: Ladene Artist, MD;  Location: [facility];  Service: Endoscopy;  Laterality: N/A;  . CYSTOSCOPY N/A 12/26/2015   Procedure: CYSTOSCOPY;  Surgeon: Linda Hedges, DO;  Location: [facility];  Service: Gynecology;  Laterality: N/A;  . HYSTEROSCOPY W/D&C N/A 01/02/2014   Procedure: DILATATION AND CURETTAGE /HYSTEROSCOPY;  Surgeon: Linda Hedges, DO;  Location: [facility];  Service: Gynecology;  Laterality: N/A;  . LAPAROSCOPIC VAGINAL HYSTERECTOMY WITH SALPINGO OOPHORECTOMY Bilateral 12/26/2015   Procedure: LAPAROSCOPIC ASSISTED VAGINAL HYSTERECTOMY WITH SALPINGO OOPHORECTOMY;  Surgeon: Linda Hedges, DO;  Location: [facility];  Service: Gynecology;  Laterality: Bilateral;  . RECTOCELE REPAIR N/A 12/26/2015   Procedure: POSTERIOR REPAIR (RECTOCELE);  Surgeon: Linda Hedges, DO;  Location: [facility];  Service: Gynecology;  Laterality: N/A;  .  ROTATOR CUFF REPAIR     left  . TUBAL LIGATION  1992   Social History   Occupational History    Employer: UNEMPLOYED  Tobacco Use  . Smoking status: Never Smoker  . Smokeless tobacco: Never Used  Substance and Sexual Activity  . Alcohol use: No    Alcohol/week: 0.0 oz  . Drug use: No  . Sexual activity: Not Currently

## 2018-03-03 ENCOUNTER — Other Ambulatory Visit: Payer: Self-pay | Admitting: Student in an Organized Health Care Education/Training Program

## 2018-04-28 DIAGNOSIS — G4733 Obstructive sleep apnea (adult) (pediatric): Secondary | ICD-10-CM | POA: Diagnosis not present

## 2018-06-17 DIAGNOSIS — G4733 Obstructive sleep apnea (adult) (pediatric): Secondary | ICD-10-CM | POA: Diagnosis not present

## 2018-06-18 ENCOUNTER — Other Ambulatory Visit: Payer: Self-pay | Admitting: Internal Medicine

## 2018-06-18 DIAGNOSIS — M722 Plantar fascial fibromatosis: Secondary | ICD-10-CM

## 2018-06-19 ENCOUNTER — Other Ambulatory Visit: Payer: Self-pay | Admitting: Student in an Organized Health Care Education/Training Program

## 2018-06-20 ENCOUNTER — Encounter: Payer: Self-pay | Admitting: *Deleted

## 2018-06-24 ENCOUNTER — Other Ambulatory Visit: Payer: Self-pay | Admitting: Internal Medicine

## 2018-06-24 DIAGNOSIS — J309 Allergic rhinitis, unspecified: Secondary | ICD-10-CM

## 2018-07-26 ENCOUNTER — Other Ambulatory Visit: Payer: Self-pay | Admitting: Internal Medicine

## 2018-07-26 DIAGNOSIS — Z1231 Encounter for screening mammogram for malignant neoplasm of breast: Secondary | ICD-10-CM

## 2018-08-04 ENCOUNTER — Encounter: Payer: Medicare Other | Admitting: Internal Medicine

## 2018-08-12 ENCOUNTER — Other Ambulatory Visit: Payer: Self-pay

## 2018-08-12 ENCOUNTER — Encounter: Payer: Self-pay | Admitting: Internal Medicine

## 2018-08-12 ENCOUNTER — Ambulatory Visit (INDEPENDENT_AMBULATORY_CARE_PROVIDER_SITE_OTHER): Payer: Medicare Other | Admitting: Internal Medicine

## 2018-08-12 VITALS — BP 134/52 | HR 78 | Temp 99.2°F | Ht 67.0 in | Wt 330.3 lb

## 2018-08-12 DIAGNOSIS — K5730 Diverticulosis of large intestine without perforation or abscess without bleeding: Secondary | ICD-10-CM | POA: Diagnosis not present

## 2018-08-12 DIAGNOSIS — D123 Benign neoplasm of transverse colon: Secondary | ICD-10-CM | POA: Diagnosis not present

## 2018-08-12 DIAGNOSIS — G4733 Obstructive sleep apnea (adult) (pediatric): Secondary | ICD-10-CM

## 2018-08-12 DIAGNOSIS — Z23 Encounter for immunization: Secondary | ICD-10-CM

## 2018-08-12 DIAGNOSIS — I10 Essential (primary) hypertension: Secondary | ICD-10-CM

## 2018-08-12 DIAGNOSIS — M722 Plantar fascial fibromatosis: Secondary | ICD-10-CM

## 2018-08-12 DIAGNOSIS — E785 Hyperlipidemia, unspecified: Secondary | ICD-10-CM

## 2018-08-12 DIAGNOSIS — Z79899 Other long term (current) drug therapy: Secondary | ICD-10-CM

## 2018-08-12 DIAGNOSIS — Z86010 Personal history of colonic polyps: Secondary | ICD-10-CM

## 2018-08-12 DIAGNOSIS — R600 Localized edema: Secondary | ICD-10-CM

## 2018-08-12 DIAGNOSIS — Z9989 Dependence on other enabling machines and devices: Secondary | ICD-10-CM

## 2018-08-12 DIAGNOSIS — M7662 Achilles tendinitis, left leg: Secondary | ICD-10-CM

## 2018-08-12 DIAGNOSIS — M7661 Achilles tendinitis, right leg: Secondary | ICD-10-CM

## 2018-08-12 DIAGNOSIS — R51 Headache: Secondary | ICD-10-CM

## 2018-08-12 DIAGNOSIS — R110 Nausea: Secondary | ICD-10-CM

## 2018-08-12 DIAGNOSIS — R12 Heartburn: Secondary | ICD-10-CM

## 2018-08-12 DIAGNOSIS — Z6841 Body Mass Index (BMI) 40.0 and over, adult: Secondary | ICD-10-CM

## 2018-08-12 DIAGNOSIS — R202 Paresthesia of skin: Secondary | ICD-10-CM

## 2018-08-12 DIAGNOSIS — Z0000 Encounter for general adult medical examination without abnormal findings: Secondary | ICD-10-CM

## 2018-08-12 DIAGNOSIS — H538 Other visual disturbances: Secondary | ICD-10-CM

## 2018-08-12 NOTE — Patient Instructions (Addendum)
It was a pleasure to see you today [name]. Please make the following changes:  -Please continue taking your amlodipine and losartan-hctz -I am checking your cholesterol levels and checking for hepatitis c today and will let you know the restults  -Please work on eating a healthy diet and exercise like we discussed  If you have any questions or concerns, please call our clinic at [phone] between 9am-5pm and after hours call [phone] and ask for the internal medicine resident on call. If you feel you are having a medical emergency please call 911.   Thank you, we look forward to help you remain healthy!  Lars Mage, MD Internal Medicine PGY2

## 2018-08-12 NOTE — Progress Notes (Signed)
   CC: Essential Hypertension  HPI:  [name] is a 57 y.o. with essential hypertension, OSA on cpap, diverticulosis, and hyperlipidemia who presents for blood pressure follow up. Please see problem based charting for evaluation, assessment, and plan.  Past Medical History:  Diagnosis Date  . Allergic rhinitis 09/23/2015  . Carpal tunnel syndrome, bilateral   . Cervical polyp    possible history of cervical polyp  . Complication of anesthesia    low bp after hysterctomy  . Cystocele 01/13/2010  . DDD (degenerative disc disease), lumbar   . Degenerative disk disease 10/07/2012  . Degenerative lumbar spinal stenosis   . Diverticulosis 03/26/2010  . GERD 09/05/2009  . Hyperlipidemia LDL goal < 130 09/25/2008  . Hypertension 09/25/2008  . Microscopic hematuria 01/13/2010  . Morbid obesity 08/28/2010  . Obstructive sleep apnea on CPAP 06/20/2010   cpap   . Plantar fasciitis   . Pneumonia    6/16  . Uterine prolapse 11/19/2008  . Vertigo 10/07/2012   Review of Systems:    Review of Systems  Eyes: Positive for blurred vision.  Respiratory: Negative for shortness of breath.   Cardiovascular: Negative for palpitations.  Gastrointestinal: Positive for heartburn and nausea. Negative for vomiting.  Genitourinary: Negative for dysuria.  Neurological: Positive for tingling (bilateral hands and feet) and headaches. Negative for dizziness.   Physical Exam:  Vitals:   08/12/18 1605  BP: (!) 134/52  Pulse: 78  Temp: 99.2 F (37.3 C)  TempSrc: Oral  SpO2: 98%  Weight: (!) 330 lb 4.8 oz (149.8 kg)  Height: 5\' 7"  (1.702 m)   Physical Exam  Constitutional: She appears well-developed and well-nourished. No distress.  HENT:  Head: Normocephalic and atraumatic.  Eyes: Conjunctivae are normal.  Cardiovascular: Normal rate, regular rhythm and normal heart sounds.  Respiratory: Effort normal and breath sounds normal. No respiratory distress. She has no wheezes.  GI: She exhibits  no distension. There is no tenderness.  Musculoskeletal: She exhibits edema. Tenderness: 2+ in bilateral lower extremities with more visible edema in left side.  Neurological: She is alert.  Skin: She is not diaphoretic.  Psychiatric: She has a normal mood and affect. Her behavior is normal. Judgment and thought content normal.     Assessment & Plan:   See Encounters Tab for problem based charting.  Patient discussed with Dr. Rebeca Alert

## 2018-08-13 LAB — LIPID PANEL
Chol/HDL Ratio: 5.3 ratio — ABNORMAL HIGH (ref 0.0–4.4)
Cholesterol, Total: 251 mg/dL — ABNORMAL HIGH (ref 100–199)
HDL: 47 mg/dL (ref >39)
LDL Calculated: 181 mg/dL — ABNORMAL HIGH (ref 0–99)
Triglycerides: 113 mg/dL (ref 0–149)
VLDL Cholesterol Cal: 23 mg/dL (ref 5–40)

## 2018-08-13 LAB — HEPATITIS C ANTIBODY: Hep C Virus Ab: <0.1 s/co ratio (ref 0.0–0.9)

## 2018-08-13 MED ORDER — ROSUVASTATIN CALCIUM 10 MG PO TABS: 10.0000 mg | ORAL_TABLET | Freq: Every day | ORAL | 1 refills | Status: DC

## 2018-08-13 NOTE — Assessment & Plan Note (Signed)
The patient's last colonoscopy was on 03/23/17 which showed a 83mm sessile polyp in the splenic flexure that was removed and there was moderate diverticulosis noted in the left colon. The patient was recommended for repeat colonoscopy in 5 years. Changed modifier in health maintenance banner to every 5 years so that providers will be notified.

## 2018-08-13 NOTE — Assessment & Plan Note (Addendum)
The patient's last lipid panel was done in November 2017 which showed tcholes=207, ldl=135, hdl=52, trigl=101. The patient's ascvd risk score was 5.2%. The patient was taking zetia at that time however due to her low ascvd risk her zetia was stopped. 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  Assessment and plan  Will repeat lipid panel to note whether there was any progression of her hyperlipidemia.   ADDENDUM The patient's lipid panel returned with tcholes=251,hdl=47,ldl=181 and a calculated ascvd score of 6.3%. Due to the patient's ascvd score the patient is recommended to start on moderate intensity statin. 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  ADDENDUM  The patient called and stated that she feels that the rosuvastatin is causing her to develop muscle pains. The patient no longer wishes to be on this medication. Switched patient back to zetia 10mg  qd.

## 2018-08-13 NOTE — Assessment & Plan Note (Addendum)
The patient has been having left sided edema in both upper and lower extremities for the past several years. She states that she feels like the edema started approximatley 10 years ago after she had a sudden jerking movement in her left shoulder.   Chart review shows documented left leg edema dating back to 2014 that was thought to be as a result of her obese body habitus.  The patient states that she has been having leg pain after walking a short distance.   Assessment and plan  Unilateral edema can be due to numerous causes. The patient does not have any known strokes causing hemiparesis. She has not had any history of dvts. The patient has bilateral plantar fasciitis and bilateral achilles tendonitis that maybe contributing to some lower extremity edema. Other possible reasons of the unilateral edema can be peripheral artery disease especially due to the patient's claudication symptoms or May Thurner syndrome.   Attempted to get ABIs in office to evaluate for PAD, however was unsuccessful in obtaining in office. Therefore, referred to vascular to get ABIs. Other likely cause for the patient's edema can be lymphadenopathy secondary to her morbid obesity (BMI 51).

## 2018-08-13 NOTE — Assessment & Plan Note (Signed)
Influenza vaccine was given during this visit  Hepatitis c antibody was checked  The patient was being followed by gynecologist Dr. Azucena Fallen for her high risk HPV test, however the patient states that she can no longer be seen at that office due to her insurance. Therefore, will re-refer the patient to gynecology so that she can establish care at a different clinic.

## 2018-08-13 NOTE — Addendum Note (Signed)
Addended by: Lars Mage on: 08/13/2018 08:27 PM   Modules accepted: Orders

## 2018-08-13 NOTE — Assessment & Plan Note (Signed)
The patient's blood pressure during this visit was 134/52. The patient is currently taking losartan-hctz 100-25mg  qd, amlodipine 10mg  qd. His last blood pressure visits are   BP Readings from Last 3 Encounters:  08/12/18 (!) 134/52  01/05/18 139/64  08/20/17 127/69   The patient does/does not report palpitations, dizziness, chest pain, sob.  Assessment and Plan The patient's blood pressure is at goal and therefore will continue her on her current medication.   -continue amlodipine 10mg  qd -continue losartan-hctz 100-25mg  qd

## 2018-08-14 NOTE — Progress Notes (Signed)
Internal Medicine Clinic Attending  Case discussed with Dr. Chundi at the time of the visit.  We reviewed the resident's history and exam and pertinent patient test results.  I agree with the assessment, diagnosis, and plan of care documented in the resident's note.  Alexander Raines, M.D., Ph.D.  

## 2018-08-15 ENCOUNTER — Telehealth: Payer: Self-pay | Admitting: Internal Medicine

## 2018-08-15 NOTE — Telephone Encounter (Signed)
Pt said that she prefer the other choestrol medicine; pt contact [phone] Unsure the name of medicine

## 2018-08-15 NOTE — Telephone Encounter (Signed)
Spoke to patient and emphasized the importance of her being on a statin as it is more effective than zetia. She agreed to do a trial of it.

## 2018-08-18 ENCOUNTER — Other Ambulatory Visit: Payer: Self-pay | Admitting: Student in an Organized Health Care Education/Training Program

## 2018-08-18 DIAGNOSIS — K219 Gastro-esophageal reflux disease without esophagitis: Secondary | ICD-10-CM

## 2018-08-20 ENCOUNTER — Other Ambulatory Visit: Payer: Self-pay | Admitting: Internal Medicine

## 2018-08-20 DIAGNOSIS — I10 Essential (primary) hypertension: Secondary | ICD-10-CM

## 2018-08-29 ENCOUNTER — Ambulatory Visit
Admission: RE | Admit: 2018-08-29 | Discharge: 2018-08-29 | Disposition: A | Discharge status: Home / Self Care | Payer: Medicare Other | Source: Ambulatory Visit | Attending: Internal Medicine | Admitting: Internal Medicine

## 2018-08-29 DIAGNOSIS — Z1231 Encounter for screening mammogram for malignant neoplasm of breast: Secondary | ICD-10-CM | POA: Diagnosis not present

## 2018-08-30 ENCOUNTER — Other Ambulatory Visit: Payer: Self-pay | Admitting: Internal Medicine

## 2018-08-30 ENCOUNTER — Encounter: Payer: Self-pay | Admitting: *Deleted

## 2018-08-30 DIAGNOSIS — M722 Plantar fascial fibromatosis: Secondary | ICD-10-CM

## 2018-08-30 NOTE — Telephone Encounter (Signed)
Need refills on muscle relaxer @ [facility] on [address]   ;pt contact [phone]  Pt also wanted to state the Liptor is causing her to her muscle pain and she would like to go to the medicine that was discuss, pls contact pt

## 2018-08-31 MED ORDER — CYCLOBENZAPRINE HCL 10 MG PO TABS: 10.0000 mg | ORAL_TABLET | Freq: Two times a day (BID) | ORAL | 0 refills | Status: DC | PRN

## 2018-09-01 ENCOUNTER — Telehealth: Payer: Self-pay | Admitting: Internal Medicine

## 2018-09-01 ENCOUNTER — Other Ambulatory Visit: Payer: Self-pay

## 2018-09-01 DIAGNOSIS — I739 Peripheral vascular disease, unspecified: Secondary | ICD-10-CM

## 2018-09-01 NOTE — Telephone Encounter (Signed)
Pt states the cholesterol med she recently started has started the same pains she had before she would prefer zetia, states she took it before and had no problem with it, she stopped taking the crestor 10/2 Also she is asking about her referral to [facility] Will send to lelas., chilonb. And dr Maricela Bo

## 2018-09-01 NOTE — Telephone Encounter (Signed)
Needs refill on cholesterol medicine that she was on 2017 at [facility] ;pt contact [phone]

## 2018-09-02 MED ORDER — EZETIMIBE 10 MG PO TABS: 10.0000 mg | ORAL_TABLET | Freq: Every day | ORAL | 0 refills | Status: DC

## 2018-09-02 NOTE — Addendum Note (Signed)
Addended by: Lars Mage on: 09/02/2018 07:27 AM   Modules accepted: Orders

## 2018-09-02 NOTE — Telephone Encounter (Signed)
Patient has been having muscle aches subsequent to using rosuvastatin. She was previously on zetia and tolerated it well. Therefore, I have re-prescribed zetia 10mg  qd. Please inform the patient to start using zetia and stop rosuvastatin.

## 2018-09-02 NOTE — Telephone Encounter (Signed)
Spoke w/ pt this am, she verb back her instructions, thanked you for changing

## 2018-09-17 ENCOUNTER — Other Ambulatory Visit: Payer: Self-pay | Admitting: Internal Medicine

## 2018-09-17 DIAGNOSIS — M722 Plantar fascial fibromatosis: Secondary | ICD-10-CM

## 2018-10-17 ENCOUNTER — Other Ambulatory Visit: Payer: Self-pay | Admitting: Internal Medicine

## 2018-10-17 DIAGNOSIS — I10 Essential (primary) hypertension: Secondary | ICD-10-CM

## 2018-10-18 ENCOUNTER — Other Ambulatory Visit: Payer: Self-pay | Admitting: Internal Medicine

## 2018-10-18 DIAGNOSIS — L309 Dermatitis, unspecified: Secondary | ICD-10-CM

## 2018-10-24 ENCOUNTER — Ambulatory Visit: Payer: Medicare Other | Admitting: Surgery

## 2018-10-24 ENCOUNTER — Encounter: Payer: Self-pay | Admitting: Surgery

## 2018-10-24 ENCOUNTER — Ambulatory Visit (HOSPITAL_COMMUNITY)
Admission: RE | Admit: 2018-10-24 | Discharge: 2018-10-24 | Disposition: A | Discharge status: Home / Self Care | Payer: Medicare Other | Source: Ambulatory Visit | Attending: Surgery | Admitting: Surgery

## 2018-10-24 ENCOUNTER — Other Ambulatory Visit: Payer: Self-pay

## 2018-10-24 ENCOUNTER — Telehealth: Payer: Self-pay

## 2018-10-24 VITALS — BP 145/82 | HR 78 | Temp 97.8°F | Resp 20 | Ht 67.0 in | Wt 322.5 lb

## 2018-10-24 DIAGNOSIS — I739 Peripheral vascular disease, unspecified: Secondary | ICD-10-CM | POA: Insufficient documentation

## 2018-10-24 DIAGNOSIS — I872 Venous insufficiency (chronic) (peripheral): Secondary | ICD-10-CM | POA: Diagnosis not present

## 2018-10-24 NOTE — Progress Notes (Signed)
[facility]  Patient name: Amy Jarvis MRN: [id number] DOB: 04-Jan-1961 Sex: female   REQUESTING PROVIDER:    Dr. Maricela Bo   REASON FOR CONSULT:    claudication  HISTORY OF PRESENT ILLNESS:   Amy Jarvis is a 57 y.o. female, who is referred today for leg complaints.  She has had swelling in the left arm and left leg for several years.  She feels that it all began after a sudden jerking motion in her left shoulder.  Historically, it has been felt that this was secondary to body habitus.  She denies any history of DVT.  Patient is medically managed for essential hypertension.  She is on CPAP for obstructive sleep apnea.  She suffers from hypercholesterolemia but does not take a statin.  She has known lumbar degenerative disc disease and spinal stenosis.  PAST MEDICAL HISTORY    Past Medical History:  Diagnosis Date  . Allergic rhinitis 09/23/2015  . Carpal tunnel syndrome, bilateral   . Cervical polyp    possible history of cervical polyp  . Complication of anesthesia    low bp after hysterctomy  . Cystocele 01/13/2010  . DDD (degenerative disc disease), lumbar   . Degenerative disk disease 10/07/2012  . Degenerative lumbar spinal stenosis   . Diverticulosis 03/26/2010  . GERD 09/05/2009  . Hyperlipidemia LDL goal < 130 09/25/2008  . Hypertension 09/25/2008  . Microscopic hematuria 01/13/2010  . Morbid obesity 08/28/2010  . Obstructive sleep apnea on CPAP 06/20/2010   cpap   . Plantar fasciitis   . Pneumonia    6/16  . Uterine prolapse 11/19/2008  . Vertigo 10/07/2012     FAMILY HISTORY   Family History  Problem Relation Age of Onset  . Colon cancer Mother 46  . Hypertension Mother   . Stroke Mother 62  . Breast cancer Other        maternal great-aunt  . Cancer Other        maternal great aunt with breast cancer  . Prostate cancer Brother   . Healthy Son   . Healthy Daughter     SOCIAL  HISTORY:   Social History   Socioeconomic History  . Marital status: Divorced    Spouse name: Not on file  . Number of children: 3  . Years of education: Not on file  . Highest education level: Not on file  Occupational History    Employer: UNEMPLOYED  Social Needs  . Financial resource strain: Not on file  . Food insecurity:    Worry: Not on file    Inability: Not on file  . Transportation needs:    Medical: Not on file    Non-medical: Not on file  Tobacco Use  . Smoking status: Never Smoker  . Smokeless tobacco: Never Used  Substance and Sexual Activity  . Alcohol use: No    Alcohol/week: 0.0 standard drinks  . Drug use: No  . Sexual activity: Not Currently  Lifestyle  . Physical activity:    Days per week: Not on file    Minutes per session: Not on file  . Stress: Not on file  Relationships  . Social connections:    Talks on phone: Not on file    Gets together: Not on file    Attends religious service: Not on file    Active member of club or organization: Not on file    Attends meetings of clubs or organizations: Not on file  Relationship status: Not on file  . Intimate partner violence:    Fear of current or ex partner: Not on file    Emotionally abused: Not on file    Physically abused: Not on file    Forced sexual activity: Not on file  Other Topics Concern  . Not on file  Social History Narrative   Lives alone in a one story home.  Has 3 children.  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     Previously a Psychologist, counselling.     Education: some college.    ALLERGIES:    Allergies  Allergen Reactions  . Ace Inhibitors Anaphylaxis, Itching and Swelling  . Promethazine Hcl Itching, Swelling, Rash and Other (See Comments)    Pt states that this medication causes her lips to swell.      CURRENT MEDICATIONS:    Current Outpatient Medications  Medication Sig Dispense Refill  . acetaminophen (TYLENOL) 500 MG tablet Take 2 tablets (1,000 mg  total) by mouth every 8 (eight) hours as needed for mild pain or moderate pain. 90 tablet 0  . albuterol (PROVENTIL HFA;VENTOLIN HFA) 108 (90 Base) MCG/ACT inhaler Inhale 2 puffs into the lungs every 4 (four) hours as needed for wheezing or shortness of breath. 1 Inhaler 0  . amLODipine (NORVASC) 10 MG tablet TAKE 1 TABLET(10 MG) BY MOUTH DAILY 90 tablet 3  . cyclobenzaprine (FLEXERIL) 10 MG tablet Take 1 tablet (10 mg total) by mouth 2 (two) times daily as needed for muscle spasms. 60 tablet 0  . diclofenac sodium (VOLTAREN) 1 % GEL Apply 4 g topically 4 (four) times daily. 1 Tube 3  . esomeprazole (NEXIUM) 40 MG capsule TAKE 1 CAPSULE BY MOUTH EVERY DAY BEFORE BREAKFAST 90 capsule 0  . ezetimibe (ZETIA) 10 MG tablet Take 1 tablet (10 mg total) by mouth daily. 90 tablet 0  . fluticasone (FLONASE) 50 MCG/ACT nasal spray SHAKE LIQUID AND USE 1 SPRAY IN EACH NOSTRIL DAILY 16 g 2  . gabapentin (NEURONTIN) 300 MG capsule TAKE 1 CAPSULE(300 MG) BY MOUTH AT BEDTIME 90 capsule 5  . gabapentin (NEURONTIN) 300 MG capsule TAKE 1 CAPSULE(300 MG) BY MOUTH AT BEDTIME 90 capsule 3  . hydrocortisone 2.5 % lotion Apply 1 application topically 2 (two) times daily as needed (itching). 59 mL 1  . ibuprofen (ADVIL,MOTRIN) 800 MG tablet TAKE 1 TABLET(800 MG) BY MOUTH AT BEDTIME AS NEEDED 90 tablet 0  . losartan-hydrochlorothiazide (HYZAAR) 100-25 MG tablet Take 1 tablet by mouth daily. 90 tablet 1  . meclizine (ANTIVERT) 25 MG tablet take 1 tablet by mouth three times a day if needed 30 tablet 2  . omega-3 acid ethyl esters (LOVAZA) 1 g capsule Take 1 g by mouth.    . potassium chloride SA (K-DUR,KLOR-CON) 20 MEQ tablet TAKE 1 TABLET(20 MEQ) BY MOUTH DAILY AS NEEDED FOR LEG CRAMPS 90 tablet 0  . venlafaxine XR (EFFEXOR-XR) 75 MG 24 hr capsule Take 75 mg by mouth at bedtime.   0  . vitamin C (ASCORBIC ACID) 500 MG tablet Take 500 mg by mouth daily.     No current facility-administered medications for this visit.      REVIEW OF SYSTEMS:   [X]  denotes positive finding, [ ]  denotes negative finding Cardiac  Comments:  Chest pain or chest pressure:    Shortness of breath upon exertion: x   Short of breath when lying flat:    Irregular heart rhythm:  Vascular    Pain in calf, thigh, or hip brought on by ambulation:    Pain in feet at night that wakes you up from your sleep:  x   Blood clot in your veins:    Leg swelling:  x       Pulmonary    Oxygen at home:    Productive cough:     Wheezing:         Neurologic    Sudden weakness in arms or legs:     Sudden numbness in arms or legs:  x   Sudden onset of difficulty speaking or slurred speech:    Temporary loss of vision in one eye:     Problems with dizziness:  x       Gastrointestinal    Blood in stool:      Vomited blood:         Genitourinary    Burning when urinating:     Blood in urine:        Psychiatric    Major depression:         Hematologic    Bleeding problems:    Problems with blood clotting too easily:        Skin    Rashes or ulcers:        Constitutional    Fever or chills:     PHYSICAL EXAM:   There were no vitals filed for this visit.  GENERAL: The patient is a well-nourished female, in no acute distress. The vital signs are documented above. CARDIAC: There is a regular rate and rhythm.  VASCULAR: Bilateral edema.  Palpable pedal pulses bilaterally PULMONARY: Nonlabored respirations ABDOMEN: Obese MUSCULOSKELETAL: There are no major deformities or cyanosis. NEUROLOGIC: No focal weakness or paresthesias are detected. SKIN: There are no ulcers or rashes noted. PSYCHIATRIC: The patient has a normal affect.  STUDIES:   I have ordered and reviewed the following:  ABI Findings: +---------+------------------+-----+---------+--------+ Right  Rt Pressure (mmHg)IndexWaveform Comment  +---------+------------------+-----+---------+--------+ Brachial 146                      +---------+------------------+-----+---------+--------+ ATA   139        0.95 triphasic     +---------+------------------+-----+---------+--------+ PTA   129        0.88 triphasic     +---------+------------------+-----+---------+--------+ Great Toe74        0.51           +---------+------------------+-----+---------+--------+  +---------+------------------+-----+---------+-------+ Left   Lt Pressure (mmHg)IndexWaveform Comment +---------+------------------+-----+---------+-------+ Brachial 139                     +---------+------------------+-----+---------+-------+ ATA   160        1.10 triphasic     +---------+------------------+-----+---------+-------+ PTA   140        0.96 triphasic     +---------+------------------+-----+---------+-------+ Great Toe88        0.60           +---------+------------------+-----+---------+-------+  +-------+-----------+-----------+------------+------------+ ABI/TBIToday's ABIToday's TBIPrevious ABIPrevious TBI +-------+-----------+-----------+------------+------------+ Right 0.95    0.51                 +-------+-----------+-----------+------------+------------+ Left  1.10    0.60                 +-------+-----------+-----------+------------+------------+  ASSESSMENT and PLAN   PAD: The patient has normal ankle-brachial indices with palpable pedal pulses, however her toe pressures are low suggesting that she has microvascular disease.  I  told her that this is a finding we see in patients with diabetes.  Leg swelling: I suspect she has a component of chronic venous insufficiency.  She did not undergo venous testing today.  I have encouraged her to wear 20-30 compression stockings to help prevent long-term complications of lower extremity  edema.  I am going to bring her back in 3 months for a venous insufficiency ultrasound of the left leg  We had a significant discussion regarding options for weight loss including surgery versus diet and exercise.  I stressed the importance of weight loss on her overall health.  She is eager to lose weight.   Annamarie Major, MD [facility] [phone] Pager [phone]

## 2018-10-24 NOTE — Telephone Encounter (Signed)
Requesting a refill on hydrocortisone cream instead the lotion. Please call pt back. Pt is using  [id number] - [place], [address] [place] [phone] (Phone) [phone] (Fax)

## 2018-10-26 ENCOUNTER — Other Ambulatory Visit: Payer: Self-pay | Admitting: Internal Medicine

## 2018-10-26 ENCOUNTER — Telehealth: Payer: Self-pay

## 2018-10-26 MED ORDER — HYDROCORTISONE 1 % EX CREA: TOPICAL_CREAM | CUTANEOUS | 0 refills | Status: DC

## 2018-10-26 NOTE — Telephone Encounter (Signed)
It has been refilled.

## 2018-10-26 NOTE — Telephone Encounter (Signed)
Pt states she continually uses hydrocortisone for eczema, she prefers the cream, it seems to work better and is less messy. She plans on keeping appt 12/13.could she possibly have 1 refill to use til her appt? Cream instead of lotion

## 2018-10-26 NOTE — Progress Notes (Signed)
Refilled hydrocortisone 1% cream rather than lotion.   Lars Mage, MD Internal Medicine PGY2 Pager:[phone] 10/26/2018, 11:31 AM

## 2018-10-26 NOTE — Telephone Encounter (Signed)
Pt called back to see if the doctor approved for the hydrocortisone cream instead of the lotion. Pt states she called on Monday, and have not received a call back from the clinic. Please call pt back.

## 2018-11-10 NOTE — Progress Notes (Signed)
   CC: Essential Hypertension  HPI:  Amy Jarvis is a 57 y.o. essential htn, osa on cpap, microscopic hematuria who presents for follow up of above chronic diseases. Please see problem based charting for evaluation, assessment, and plan.  Past Medical History:  Diagnosis Date  . Allergic rhinitis 09/23/2015  . Carpal tunnel syndrome, bilateral   . Cervical polyp    possible history of cervical polyp  . Complication of anesthesia    low bp after hysterctomy  . Cystocele 01/13/2010  . DDD (degenerative disc disease), lumbar   . Degenerative disk disease 10/07/2012  . Degenerative lumbar spinal stenosis   . Diverticulosis 03/26/2010  . GERD 09/05/2009  . Hyperlipidemia LDL goal < 130 09/25/2008  . Hypertension 09/25/2008  . Microscopic hematuria 01/13/2010  . Morbid obesity 08/28/2010  . Obstructive sleep apnea on CPAP 06/20/2010   cpap   . Plantar fasciitis   . Pneumonia    6/16  . Uterine prolapse 11/19/2008  . Vertigo 10/07/2012   Review of Systems:    Review of Systems  Constitutional: Positive for malaise/fatigue. Negative for chills, fever and weight loss.  Respiratory: Negative for shortness of breath.   Cardiovascular: Negative for chest pain.  Gastrointestinal: Negative for nausea and vomiting.  Neurological: Negative for headaches.   Physical Exam:  Vitals:   11/11/18 1327  BP: (!) 154/78  Pulse: 95  Temp: 97.8 F (36.6 C)  TempSrc: Oral  SpO2: 100%  Weight: (!) 331 lb 1.6 oz (150.2 kg)  Height: 5\' 7"  (1.702 m)   Physical Exam  Constitutional: She appears well-developed and well-nourished. No distress.  HENT:  Head: Normocephalic and atraumatic.  Eyes: Conjunctivae are normal.  Cardiovascular: Normal rate, regular rhythm and normal heart sounds.  No murmur heard. Respiratory: Effort normal and breath sounds normal. No respiratory distress. She has no wheezes.  GI: Soft. Bowel sounds are normal. She exhibits no distension. There is no abdominal  tenderness.  Musculoskeletal:        General: No edema.  Neurological: She is alert.  Skin: She is not diaphoretic.  Psychiatric: She has a normal mood and affect. Her behavior is normal. Judgment and thought content normal.     Assessment & Plan:   See Encounters Tab for problem based charting.  Patient discussed with Dr. Dareen Piano

## 2018-11-11 ENCOUNTER — Ambulatory Visit (INDEPENDENT_AMBULATORY_CARE_PROVIDER_SITE_OTHER): Payer: Medicare Other | Admitting: Internal Medicine

## 2018-11-11 ENCOUNTER — Encounter: Payer: Self-pay | Admitting: Internal Medicine

## 2018-11-11 ENCOUNTER — Other Ambulatory Visit: Payer: Self-pay

## 2018-11-11 VITALS — BP 154/78 | HR 95 | Temp 97.8°F | Ht 67.0 in | Wt 331.1 lb

## 2018-11-11 DIAGNOSIS — D649 Anemia, unspecified: Secondary | ICD-10-CM

## 2018-11-11 DIAGNOSIS — Z9989 Dependence on other enabling machines and devices: Secondary | ICD-10-CM

## 2018-11-11 DIAGNOSIS — G4733 Obstructive sleep apnea (adult) (pediatric): Secondary | ICD-10-CM | POA: Diagnosis not present

## 2018-11-11 DIAGNOSIS — I10 Essential (primary) hypertension: Secondary | ICD-10-CM

## 2018-11-11 DIAGNOSIS — Z23 Encounter for immunization: Secondary | ICD-10-CM

## 2018-11-11 DIAGNOSIS — E785 Hyperlipidemia, unspecified: Secondary | ICD-10-CM

## 2018-11-11 DIAGNOSIS — R3129 Other microscopic hematuria: Secondary | ICD-10-CM | POA: Diagnosis not present

## 2018-11-11 DIAGNOSIS — Z0000 Encounter for general adult medical examination without abnormal findings: Secondary | ICD-10-CM

## 2018-11-11 DIAGNOSIS — Z6841 Body Mass Index (BMI) 40.0 and over, adult: Secondary | ICD-10-CM

## 2018-11-11 DIAGNOSIS — Z79899 Other long term (current) drug therapy: Secondary | ICD-10-CM

## 2018-11-11 MED ORDER — HYDROCORTISONE 2.5 % EX CREA: TOPICAL_CREAM | Freq: Every day | CUTANEOUS | 0 refills | Status: DC | PRN

## 2018-11-11 NOTE — Patient Instructions (Signed)
It was a pleasure to see you today [name]. Your blood pressure was elevated at this visit -154/78. Please make sure to limit the amount of salt in your diet and exercise regularly. Will follow up in 1 month to monitor weight loss progress. I have also referred you to our dietician.  If you have any questions or concerns, please call our clinic at [phone] between 9am-5pm and after hours call [phone] and ask for the internal medicine resident on call. If you feel you are having a medical emergency please call 911.   Thank you, we look forward to help you remain healthy!  Lars Mage, MD Internal Medicine PGY2

## 2018-11-12 DIAGNOSIS — D649 Anemia, unspecified: Secondary | ICD-10-CM | POA: Insufficient documentation

## 2018-11-12 LAB — CBC
HEMOGLOBIN: 11.3 g/dL (ref 11.1–15.9)
Hematocrit: 35 % (ref 34.0–46.6)
MCH: 25.7 pg — ABNORMAL LOW (ref 26.6–33.0)
MCHC: 32.3 g/dL (ref 31.5–35.7)
MCV: 80 fL (ref 79–97)
Platelets: 401 10*3/uL (ref 150–450)
RBC: 4.39 x10E6/uL (ref 3.77–5.28)
RDW: 15.2 % (ref 12.3–15.4)
WBC: 5.9 10*3/uL (ref 3.4–10.8)

## 2018-11-12 LAB — BMP8+ANION GAP
ANION GAP: 20 mmol/L — AB (ref 10.0–18.0)
BUN/Creatinine Ratio: 14 (ref 9–23)
BUN: 9 mg/dL (ref 6–24)
CHLORIDE: 95 mmol/L — AB (ref 96–106)
CO2: 26 mmol/L (ref 20–29)
Calcium: 9.9 mg/dL (ref 8.7–10.2)
Creatinine, Ser: 0.64 mg/dL (ref 0.57–1.00)
GFR calc Af Amer: 114 mL/min/{1.73_m2} (ref >59)
GFR calc non Af Amer: 99 mL/min/{1.73_m2} (ref >59)
GLUCOSE: 86 mg/dL (ref 65–99)
POTASSIUM: 3.5 mmol/L (ref 3.5–5.2)
Sodium: 141 mmol/L (ref 134–144)

## 2018-11-12 NOTE — Assessment & Plan Note (Signed)
ASCVD risk score of 6.3%.  Patient is continue to take Zetia 10mg  qd.

## 2018-11-12 NOTE — Assessment & Plan Note (Addendum)
The patient's blood pressure during this visit was 154/78. The patient is currently taking losartan-hctz 100-25mg  qd and amlodipine 10mg  qd. His last blood pressure visits are   BP Readings from Last 3 Encounters:  11/11/18 (!) 154/78  10/24/18 (!) 145/82  08/12/18 (!) 134/52   The patient does not report palpitations, dizziness, chest pain, sob.  Assessment and Plan The patient's blood pressure is not controlled and review of her previous blood pressure readings show that her previous readings have been well controlled. The patient mentioned that she has not been eating a low salt diet.  -continue amlodipine 10mg  qd  -continue losartan-hctz 100-25mg  qd -Encouraged the patient to eat a low salt diet  -Will order bmp to check kidney function

## 2018-11-12 NOTE — Assessment & Plan Note (Signed)
Patient was given TDAP vaccination during this encounter.

## 2018-11-12 NOTE — Assessment & Plan Note (Signed)
Encouraged patient to continue use of cpap.

## 2018-11-12 NOTE — Assessment & Plan Note (Signed)
Patient's weight is the underlying reason for her numerous chronic problems. Her weight during this visit was 331lbs. She has gained 10lbs in the past 1 month.   Assessment and plan  -Counseled patient on importance of diet and exercise -Placed referral to nutrition to help her lose weight

## 2018-11-12 NOTE — Assessment & Plan Note (Signed)
Patient's last CBC was in January 2017 where she had normocytic anemia with mcv=81.8hb=9.5, hct=30.1, rdw=16.3. Further workup does not appear to have been done.   Patient is symptomatic with fatigue.   Assessment and plan  -Reordered cbc

## 2018-11-16 ENCOUNTER — Other Ambulatory Visit: Payer: Self-pay | Admitting: Internal Medicine

## 2018-11-16 DIAGNOSIS — K219 Gastro-esophageal reflux disease without esophagitis: Secondary | ICD-10-CM

## 2018-11-27 ENCOUNTER — Other Ambulatory Visit: Payer: Self-pay | Admitting: Internal Medicine

## 2018-11-28 NOTE — Telephone Encounter (Signed)
Next appt scheduled 02/03/19 with PCP.

## 2018-12-05 NOTE — Progress Notes (Signed)
Internal Medicine Clinic Attending  Case discussed with Dr. Chundi at the time of the visit.  We reviewed the resident's history and exam and pertinent patient test results.  I agree with the assessment, diagnosis, and plan of care documented in the resident's note. 

## 2018-12-05 NOTE — Addendum Note (Signed)
Addended by: Aldine Contes on: 12/05/2018 10:06 AM   Modules accepted: Level of Service

## 2018-12-16 ENCOUNTER — Other Ambulatory Visit: Payer: Self-pay | Admitting: Internal Medicine

## 2018-12-16 DIAGNOSIS — M722 Plantar fascial fibromatosis: Secondary | ICD-10-CM

## 2019-01-04 ENCOUNTER — Telehealth: Payer: Self-pay

## 2019-01-04 DIAGNOSIS — J309 Allergic rhinitis, unspecified: Secondary | ICD-10-CM

## 2019-01-04 NOTE — Telephone Encounter (Signed)
Requesting to speak with a nurse about getting flu med. Please call pt back.

## 2019-01-04 NOTE — Telephone Encounter (Signed)
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 Please advise

## 2019-01-05 NOTE — Telephone Encounter (Signed)
If Amy Jarvis is having symptoms she needs to be evaluated in [facility]. Unfortunately we cannot call in medication. Thank you!

## 2019-01-05 NOTE — Telephone Encounter (Signed)
Attempted to rtc, no vmail no answer

## 2019-01-12 DIAGNOSIS — G4733 Obstructive sleep apnea (adult) (pediatric): Secondary | ICD-10-CM | POA: Diagnosis not present

## 2019-01-19 ENCOUNTER — Other Ambulatory Visit: Payer: Self-pay | Admitting: *Deleted

## 2019-01-19 MED ORDER — AMLODIPINE BESYLATE 10 MG PO TABS: ORAL_TABLET | ORAL | 1 refills | Status: DC

## 2019-01-23 NOTE — Telephone Encounter (Signed)
Have not been able to reach pt, will close encounter

## 2019-01-25 ENCOUNTER — Ambulatory Visit: Payer: Medicare Other

## 2019-01-26 ENCOUNTER — Other Ambulatory Visit: Payer: Self-pay

## 2019-01-26 ENCOUNTER — Ambulatory Visit (INDEPENDENT_AMBULATORY_CARE_PROVIDER_SITE_OTHER): Payer: Medicare Other | Admitting: Internal Medicine

## 2019-01-26 VITALS — BP 157/72 | HR 87 | Temp 98.1°F | Ht 67.0 in | Wt 324.0 lb

## 2019-01-26 DIAGNOSIS — L309 Dermatitis, unspecified: Secondary | ICD-10-CM

## 2019-01-26 DIAGNOSIS — I872 Venous insufficiency (chronic) (peripheral): Secondary | ICD-10-CM

## 2019-01-26 DIAGNOSIS — J019 Acute sinusitis, unspecified: Secondary | ICD-10-CM

## 2019-01-26 DIAGNOSIS — B3731 Acute candidiasis of vulva and vagina: Secondary | ICD-10-CM

## 2019-01-26 DIAGNOSIS — Z79899 Other long term (current) drug therapy: Secondary | ICD-10-CM

## 2019-01-26 DIAGNOSIS — B373 Candidiasis of vulva and vagina: Secondary | ICD-10-CM

## 2019-01-26 MED ORDER — HYDROCORTISONE 2.5 % EX OINT: TOPICAL_OINTMENT | Freq: Two times a day (BID) | CUTANEOUS | 0 refills | Status: DC | PRN

## 2019-01-26 MED ORDER — AZELASTINE HCL 0.1 % NA SOLN: 2.0000 | Freq: Two times a day (BID) | NASAL | 1 refills | Status: DC

## 2019-01-26 MED ORDER — FLUCONAZOLE 150 MG PO TABS: 150.0000 mg | ORAL_TABLET | Freq: Every day | ORAL | 0 refills | Status: DC

## 2019-01-26 MED ORDER — AMOXICILLIN-POT CLAVULANATE 875-125 MG PO TABS: 1.0000 | ORAL_TABLET | Freq: Two times a day (BID) | ORAL | 0 refills | Status: AC

## 2019-01-26 NOTE — Patient Instructions (Addendum)
Amy Jarvis,  For your sinusitis, I suspect this is due to allergies or a viral infection. Please irrigate your sinuses daily with sterile saline. Neti Pots are great for this. After irrigation, please use astelin nasal spray, 2 sprays each nostril.   If you are not better in the next week, you may take the antibiotic that I gave you.   If you have any questions or concerns, call our clinic at [phone] or after hours call [phone] and ask for the internal medicine resident on call. Thank you!  Dr. Philipp Ovens

## 2019-01-26 NOTE — Progress Notes (Signed)
   CC: Sinus congestion   HPI:  [name] is a 58 y.o. female with past medical history outlined below here for sinus congestion. For the details of today's visit, please refer to the assessment and plan.  Past Medical History:  Diagnosis Date  . Allergic rhinitis 09/23/2015  . Carpal tunnel syndrome, bilateral   . Cervical polyp    possible history of cervical polyp  . Complication of anesthesia    low bp after hysterctomy  . Cystocele 01/13/2010  . DDD (degenerative disc disease), lumbar   . Degenerative disk disease 10/07/2012  . Degenerative lumbar spinal stenosis   . Diverticulosis 03/26/2010  . GERD 09/05/2009  . Hyperlipidemia LDL goal < 130 09/25/2008  . Hypertension 09/25/2008  . Microscopic hematuria 01/13/2010  . Morbid obesity 08/28/2010  . Obstructive sleep apnea on CPAP 06/20/2010   cpap   . Plantar fasciitis   . Pneumonia    6/16  . Uterine prolapse 11/19/2008  . Vertigo 10/07/2012    Review of Systems  HENT: Positive for congestion and sore throat.   Respiratory: Positive for cough.     Physical Exam:  Vitals:   01/26/19 1414  BP: (!) 157/72  Pulse: 87  Temp: 98.1 F (36.7 C)  TempSrc: Oral  SpO2: 97%  Weight: (!) 324 lb (147 kg)  Height: 5\' 7"  (1.702 m)    Constitutional: NAD, appears comfortable HEENT: Boggy nasal mucosa, post nasal drip  Cardiovascular: RRR, no m/r/g Pulmonary/Chest: CTAB, no wheezes, rales, or rhonchi.  Psychiatric: Normal mood and affect  Assessment & Plan:   See Encounters Tab for problem based charting.  Patient discussed with Dr. Eppie Gibson

## 2019-01-27 ENCOUNTER — Encounter: Payer: Self-pay | Admitting: Internal Medicine

## 2019-01-27 NOTE — Progress Notes (Signed)
Case discussed with Dr. Guilloud at the time of the visit.  We reviewed the resident's history and exam and pertinent patient test results.  I agree with the assessment, diagnosis and plan of care documented in the resident's note. 

## 2019-01-27 NOTE — Assessment & Plan Note (Signed)
Refilled triamcinolone ointment

## 2019-01-27 NOTE — Assessment & Plan Note (Signed)
Patient is here with acute worsening of her chronic sinusitis.  She has allergic rhinitis and takes over-the-counter antihistamines and Flonase, up until 5 days ago.  She was using Flonase twice daily but discontinued this when she developed nosebleeds.  Over the past 2 weeks she has had acute worsening of her congestion with cough and sore throat.  She denies fevers, no shortness of breath.  On exam her nasal mucosa is very erythematous and inflamed.  Suspect this is either an acute exacerbation of her chronic sinusitis versus a viral sinusitis.  Patient is requesting antibiotics, states that this is consistent with her prior bacterial sinus infections.  Discussed with patient that this is unlikely to be bacterial, and recommended nasal irrigation and Astelin nasal spray (antihistamine).  I did agree to provide patient with an antibiotic, that she will take only if these conservative measures are not effective. -Nasal irrigation daily with sterile saline (recommended Nettie pot) --Astelin nasal spray BID --Augmentin twice daily x5 days if no improvement with above measures after 1 week

## 2019-01-30 ENCOUNTER — Other Ambulatory Visit: Payer: Self-pay

## 2019-01-30 ENCOUNTER — Ambulatory Visit (HOSPITAL_COMMUNITY)
Admission: RE | Admit: 2019-01-30 | Discharge: 2019-01-30 | Disposition: A | Discharge status: Home / Self Care | Payer: Medicare Other | Source: Ambulatory Visit | Attending: Surgery | Admitting: Surgery

## 2019-01-30 ENCOUNTER — Encounter: Payer: Self-pay | Admitting: Surgery

## 2019-01-30 ENCOUNTER — Ambulatory Visit (INDEPENDENT_AMBULATORY_CARE_PROVIDER_SITE_OTHER): Payer: Medicare Other | Admitting: Surgery

## 2019-01-30 ENCOUNTER — Encounter (HOSPITAL_COMMUNITY): Payer: Medicare Other

## 2019-01-30 ENCOUNTER — Ambulatory Visit: Payer: Medicare Other | Admitting: Surgery

## 2019-01-30 VITALS — BP 125/78 | HR 76 | Temp 97.1°F | Resp 20 | Ht 67.0 in | Wt 321.4 lb

## 2019-01-30 DIAGNOSIS — I872 Venous insufficiency (chronic) (peripheral): Secondary | ICD-10-CM

## 2019-01-30 NOTE — Progress Notes (Signed)
[facility]  Patient name: Amy Jarvis MRN: [id number] DOB: Aug 27, 1961 Sex: female   REASON FOR VISIT:    Follow up  HISOTRY OF PRESENT ILLNESS:    Amy Jarvis is a 58 y.o. female, who is referred today for leg complaints.  She has had swelling in the left arm and left leg for several years.  She feels that it all began after a sudden jerking motion in her left shoulder.  Historically, it has been felt that this was secondary to body habitus.  She denies any history of DVT.  Patient is medically managed for essential hypertension.  She is on CPAP for obstructive sleep apnea.  She suffers from hypercholesterolemia but does not take a statin.  She has known lumbar degenerative disc disease and spinal stenosis.  Following her last visit, with regards to her PAD, I felt that with normal ankle-brachial indices and palpable pedal pulses she mostly has microvascular disease because of her decreased toe pressures.  I brought her back today for venous insufficiency testing.  She is wearing her compression stockings but feel they may be a little too loose PAST MEDICAL HISTORY:   Past Medical History:  Diagnosis Date  . Allergic rhinitis 09/23/2015  . Carpal tunnel syndrome, bilateral   . Cervical polyp    possible history of cervical polyp  . Complication of anesthesia    low bp after hysterctomy  . Cystocele 01/13/2010  . DDD (degenerative disc disease), lumbar   . Degenerative disk disease 10/07/2012  . Degenerative lumbar spinal stenosis   . Diverticulosis 03/26/2010  . GERD 09/05/2009  . Hyperlipidemia LDL goal < 130 09/25/2008  . Hypertension 09/25/2008  . Microscopic hematuria 01/13/2010  . Morbid obesity 08/28/2010  . Obstructive sleep apnea on CPAP 06/20/2010   cpap   . Plantar fasciitis   . Pneumonia    6/16  . Uterine prolapse 11/19/2008  . Vertigo 10/07/2012     FAMILY HISTORY:   Family History   Problem Relation Age of Onset  . Colon cancer Mother 70  . Hypertension Mother   . Stroke Mother 45  . Breast cancer Other        maternal great-aunt  . Cancer Other        maternal great aunt with breast cancer  . Prostate cancer Brother   . Healthy Son   . Healthy Daughter     SOCIAL HISTORY:   Social History   Tobacco Use  . Smoking status: Never Smoker  . Smokeless tobacco: Never Used  Substance Use Topics  . Alcohol use: No    Alcohol/week: 0.0 standard drinks     ALLERGIES:   Allergies  Allergen Reactions  . Ace Inhibitors Anaphylaxis, Itching and Swelling  . Promethazine Hcl Itching, Swelling, Rash and Other (See Comments)    Pt states that this medication causes her lips to swell.       CURRENT MEDICATIONS:   Current Outpatient Medications  Medication Sig Dispense Refill  . acetaminophen (TYLENOL) 500 MG tablet Take 2 tablets (1,000 mg total) by mouth every 8 (eight) hours as needed for mild pain or moderate pain. 90 tablet 0  . albuterol (PROVENTIL HFA;VENTOLIN HFA) 108 (90 Base) MCG/ACT inhaler Inhale 2 puffs into the lungs every 4 (four) hours as needed for wheezing or shortness of breath. 1 Inhaler 0  . amLODipine (NORVASC) 10 MG tablet TAKE 1 TABLET(10 MG) BY MOUTH DAILY 90 tablet 1  . amoxicillin-clavulanate (AUGMENTIN)  875-125 MG tablet Take 1 tablet by mouth 2 (two) times daily for 5 days. 10 tablet 0  . azelastine (ASTELIN) 0.1 % nasal spray Place 2 sprays into both nostrils 2 (two) times daily. Use in each nostril as directed 30 mL 1  . cyclobenzaprine (FLEXERIL) 10 MG tablet Take 1 tablet (10 mg total) by mouth 2 (two) times daily as needed for muscle spasms. 60 tablet 0  . diclofenac sodium (VOLTAREN) 1 % GEL Apply 4 g topically 4 (four) times daily. 1 Tube 3  . esomeprazole (NEXIUM) 40 MG capsule TAKE 1 CAPSULE BY MOUTH EVERY DAY BEFORE BREAKFAST 90 capsule 0  . ezetimibe (ZETIA) 10 MG tablet TAKE 1 TABLET(10 MG) BY MOUTH DAILY 90 tablet 0  .  fluconazole (DIFLUCAN) 150 MG tablet Take 1 tablet (150 mg total) by mouth daily. 1 tablet 0  . fluticasone (FLONASE) 50 MCG/ACT nasal spray SHAKE LIQUID AND USE 1 SPRAY IN EACH NOSTRIL DAILY 16 g 2  . gabapentin (NEURONTIN) 300 MG capsule TAKE 1 CAPSULE(300 MG) BY MOUTH AT BEDTIME 90 capsule 5  . gabapentin (NEURONTIN) 300 MG capsule TAKE 1 CAPSULE(300 MG) BY MOUTH AT BEDTIME 90 capsule 3  . hydrocortisone 2.5 % ointment Apply topically 2 (two) times daily as needed. 453.6 g 0  . ibuprofen (ADVIL,MOTRIN) 800 MG tablet TAKE 1 TABLET(800 MG) BY MOUTH AT BEDTIME AS NEEDED 90 tablet 0  . meclizine (ANTIVERT) 25 MG tablet take 1 tablet by mouth three times a day if needed 30 tablet 2  . omega-3 acid ethyl esters (LOVAZA) 1 g capsule Take 1 g by mouth.    . potassium chloride SA (K-DUR,KLOR-CON) 20 MEQ tablet TAKE 1 TABLET(20 MEQ) BY MOUTH DAILY AS NEEDED FOR LEG CRAMPS 90 tablet 0  . venlafaxine XR (EFFEXOR-XR) 75 MG 24 hr capsule Take 75 mg by mouth at bedtime.   0  . vitamin C (ASCORBIC ACID) 500 MG tablet Take 500 mg by mouth daily.    Marland Kitchen losartan-hydrochlorothiazide (HYZAAR) 100-25 MG tablet Take 1 tablet by mouth daily. 90 tablet 1   No current facility-administered medications for this visit.     REVIEW OF SYSTEMS:   [X]  denotes positive finding, [ ]  denotes negative finding Cardiac  Comments:  Chest pain or chest pressure:    Shortness of breath upon exertion:    Short of breath when lying flat:    Irregular heart rhythm:        Vascular    Pain in calf, thigh, or hip brought on by ambulation:    Pain in feet at night that wakes you up from your sleep:     Blood clot in your veins:    Leg swelling:  x       Pulmonary    Oxygen at home:    Productive cough:     Wheezing:         Neurologic    Sudden weakness in arms or legs:     Sudden numbness in arms or legs:     Sudden onset of difficulty speaking or slurred speech:    Temporary loss of vision in one eye:     Problems  with dizziness:         Gastrointestinal    Blood in stool:     Vomited blood:         Genitourinary    Burning when urinating:     Blood in urine:        Psychiatric  Major depression:         Hematologic    Bleeding problems:    Problems with blood clotting too easily:        Skin    Rashes or ulcers:        Constitutional    Fever or chills:      PHYSICAL EXAM:   Vitals:   01/30/19 1233  BP: 125/78  Pulse: 76  Resp: 20  Temp: (!) 97.1 F (36.2 C)  SpO2: 96%  Weight: (!) 321 lb 6.9 oz (145.8 kg)  Height: 5\' 7"  (1.702 m)    GENERAL: The patient is a well-nourished female, in no acute distress. The vital signs are documented above. CARDIAC: There is a regular rate and rhythm.  VASCULAR: 2+ pitting edema bilaterally PULMONARY: Non-labored respirations  MUSCULOSKELETAL: There are no major deformities or cyanosis. NEUROLOGIC: No focal weakness or paresthesias are detected. SKIN: There are no ulcers or rashes noted. PSYCHIATRIC: The patient has a normal affect.  STUDIES:   I have ordered and reviewed her vascular lab study with the following findings: Venous Reflux Times Normal value < 0.5 sec +-------------+----------+---------+              Right (ms)Left (ms) +-------------+----------+---------+ GSV mid thigh          726.00    +-------------+----------+---------+  Vein Diameters: +------------------------------+----------+--------------+                               Right (cm)Left (cm)      +------------------------------+----------+--------------+ GSV at Saphenofemoral junction0.956     0.694          +------------------------------+----------+--------------+ GSV at prox thigh             0.289     0.649          +------------------------------+----------+--------------+ GSV at mid thigh              0.223     0.402          +------------------------------+----------+--------------+ GSV at distal thigh           0.231      0.287          +------------------------------+----------+--------------+ GSV at knee                   0.169     0.232          +------------------------------+----------+--------------+ GSV prox calf                 0.159     0.217          +------------------------------+----------+--------------+ GSV mid calf                  0.204     Not visualized +------------------------------+----------+--------------+ SSV origin                    0.472     0.232          +------------------------------+----------+--------------+ SSV prox                      0267      0.216          +------------------------------+----------+--------------+ SSV mid                       0.185     0.223          +------------------------------+----------+--------------+  MEDICAL ISSUES:   Leg swelling: Based on her ultrasound findings today, this appears to be more lymphedema than chronic venous insufficiency.  I think her best course of action is to keep her legs elevated when possible and to get appropriately sized compression stockings and to wear them as often as possible.  We again discussed the importance of exercise, diet and weight loss.  She will follow-up with me on an as-needed basis.    Annamarie Major, MD [facility] [phone] Pager [phone]

## 2019-01-31 DIAGNOSIS — H40013 Open angle with borderline findings, low risk, bilateral: Secondary | ICD-10-CM | POA: Diagnosis not present

## 2019-01-31 DIAGNOSIS — H25013 Cortical age-related cataract, bilateral: Secondary | ICD-10-CM | POA: Diagnosis not present

## 2019-01-31 DIAGNOSIS — H2513 Age-related nuclear cataract, bilateral: Secondary | ICD-10-CM | POA: Diagnosis not present

## 2019-01-31 DIAGNOSIS — H35033 Hypertensive retinopathy, bilateral: Secondary | ICD-10-CM | POA: Diagnosis not present

## 2019-02-01 ENCOUNTER — Other Ambulatory Visit: Payer: Self-pay | Admitting: Internal Medicine

## 2019-02-01 DIAGNOSIS — K219 Gastro-esophageal reflux disease without esophagitis: Secondary | ICD-10-CM

## 2019-02-01 DIAGNOSIS — R42 Dizziness and giddiness: Secondary | ICD-10-CM

## 2019-02-01 NOTE — Telephone Encounter (Signed)
Next appt scheduled 3/6 with PCP.

## 2019-02-02 ENCOUNTER — Other Ambulatory Visit: Payer: Self-pay | Admitting: *Deleted

## 2019-02-02 MED ORDER — EZETIMIBE 10 MG PO TABS: ORAL_TABLET | ORAL | 0 refills | Status: DC

## 2019-02-02 NOTE — Telephone Encounter (Signed)
Next appt scheduled  Tomorrow with PCP. 

## 2019-02-02 NOTE — Progress Notes (Signed)
   CC: Hypertension follow up   HPI:  Ms.Gracee Jerilynn Mages Dhaliwal is a 58 y.o. with essential hypertension, osa on cpap, chronic pain syndrome who presented for hypertension follow up. Please see problem based charting for evaluation, assessment, and plan.  Past Medical History:  Diagnosis Date  . Allergic rhinitis 09/23/2015  . Carpal tunnel syndrome, bilateral   . Cervical polyp    possible history of cervical polyp  . Complication of anesthesia    low bp after hysterctomy  . Cystocele 01/13/2010  . DDD (degenerative disc disease), lumbar   . Degenerative disk disease 10/07/2012  . Degenerative lumbar spinal stenosis   . Diverticulosis 03/26/2010  . GERD 09/05/2009  . Hyperlipidemia LDL goal < 130 09/25/2008  . Hypertension 09/25/2008  . Microscopic hematuria 01/13/2010  . Morbid obesity 08/28/2010  . Obstructive sleep apnea on CPAP 06/20/2010   cpap   . Plantar fasciitis   . Pneumonia    6/16  . Uterine prolapse 11/19/2008  . Vertigo 10/07/2012   Review of Systems:    Review of Systems  Constitutional: Negative for chills and fever.  Respiratory: Negative for cough.   Cardiovascular: Negative for chest pain.  Gastrointestinal: Negative for abdominal pain, nausea and vomiting.  Musculoskeletal: Negative for myalgias.  Neurological: Positive for headaches.   Physical Exam:  Vitals:   02/03/19 1517  BP: 139/65  Pulse: 86  Temp: 98.2 F (36.8 C)  SpO2: 96%  Weight: (!) 325 lb 6.4 oz (147.6 kg)   Physical Exam  Constitutional: She appears well-developed and well-nourished. No distress.  HENT:  Head: Normocephalic and atraumatic.  Eyes: Conjunctivae are normal.  Cardiovascular: Normal rate, regular rhythm and normal heart sounds.  Respiratory: Effort normal and breath sounds normal. No respiratory distress. She has no wheezes.  GI: Soft. Bowel sounds are normal. She exhibits no distension. There is no abdominal tenderness.  Musculoskeletal:        General: No edema.    Neurological: She is alert.  Skin: She is not diaphoretic.  Psychiatric: She has a normal mood and affect. Her behavior is normal. Judgment and thought content normal.    Assessment & Plan:   See Encounters Tab for problem based charting.  Patient seen with Dr. Dareen Piano

## 2019-02-03 ENCOUNTER — Ambulatory Visit (INDEPENDENT_AMBULATORY_CARE_PROVIDER_SITE_OTHER): Payer: Medicare Other | Admitting: Internal Medicine

## 2019-02-03 ENCOUNTER — Ambulatory Visit (INDEPENDENT_AMBULATORY_CARE_PROVIDER_SITE_OTHER): Payer: Medicare Other | Admitting: Dietician

## 2019-02-03 ENCOUNTER — Encounter: Payer: Self-pay | Admitting: Internal Medicine

## 2019-02-03 VITALS — BP 139/65 | HR 86 | Temp 98.2°F | Wt 325.4 lb

## 2019-02-03 DIAGNOSIS — Z6841 Body Mass Index (BMI) 40.0 and over, adult: Secondary | ICD-10-CM | POA: Diagnosis not present

## 2019-02-03 DIAGNOSIS — G894 Chronic pain syndrome: Secondary | ICD-10-CM | POA: Diagnosis not present

## 2019-02-03 DIAGNOSIS — R51 Headache: Secondary | ICD-10-CM

## 2019-02-03 DIAGNOSIS — G4733 Obstructive sleep apnea (adult) (pediatric): Secondary | ICD-10-CM | POA: Diagnosis not present

## 2019-02-03 DIAGNOSIS — I10 Essential (primary) hypertension: Secondary | ICD-10-CM | POA: Diagnosis not present

## 2019-02-03 DIAGNOSIS — E785 Hyperlipidemia, unspecified: Secondary | ICD-10-CM

## 2019-02-03 DIAGNOSIS — Z713 Dietary counseling and surveillance: Secondary | ICD-10-CM | POA: Diagnosis not present

## 2019-02-03 DIAGNOSIS — I890 Lymphedema, not elsewhere classified: Secondary | ICD-10-CM | POA: Insufficient documentation

## 2019-02-03 DIAGNOSIS — Z9989 Dependence on other enabling machines and devices: Secondary | ICD-10-CM

## 2019-02-03 DIAGNOSIS — Z79899 Other long term (current) drug therapy: Secondary | ICD-10-CM

## 2019-02-03 MED ORDER — LOSARTAN POTASSIUM-HCTZ 100-25 MG PO TABS: 1.0000 | ORAL_TABLET | Freq: Every day | ORAL | 1 refills | Status: DC

## 2019-02-03 NOTE — Progress Notes (Signed)
Behavioral Therapy for Weight loss (visit #1 ) :  1. Estimated body mass index is 50.96 kg/m as calculated from the following:   Height as of 01/30/19: 5\' 7"  (1.702 m).   Weight as of an earlier encounter on 02/03/19: 325 lb 6.4 oz (147.6 kg).   Wt Readings from Last 5 Encounters:  02/03/19 (!) 325 lb 6.4 oz (147.6 kg)  01/30/19 (!) 321 lb 6.9 oz (145.8 kg)  01/26/19 (!) 324 lb (147 kg)  11/11/18 (!) 331 lb 1.6 oz (150.2 kg)  10/24/18 (!) 322 lb 8 oz (146.3 kg)   BP Readings from Last 3 Encounters:  02/03/19 139/65  01/30/19 125/78  01/26/19 (!) 157/72   2. Dietary (nutritional) assessment: intake shows appropriate eating times except sometimes skips breakfast. Where she eats and what and how much need to be addressed.  3. Intensive behavioral counseling and behavioral therapy to promote sustained  weight loss 1. Assessment of behavioral health risks and factors affecting choice of behavior goals: rates readiness to change a "5", confidence a "2" 2. Advise provided today: what she needs to do to meet goal of weight loss, enjoying food is important, importance of balanced and nutrient dense meals and snacks  3. Collaboratively set goals: keep food records, measure foods so youknow how much you are consuming 4. Assist: showed her how to use food record 5. Arrange follow up:2 [place], RD 02/03/2019 5:16 PM.

## 2019-02-03 NOTE — Patient Instructions (Addendum)
It was a pleasure to see you today [name]. Please make the following changes:  -Please continue taking all of your medication  -You have lost 5lbs since December. Please keep up with your lifestyle modifications. YOU ARE DOING GREAT AND WE WILL GET YOU TO YOUR GOAL! Let us work toward 7lb weight loss by next vist.  If you have any questions or concerns, please call our clinic at [phone] between 9am-5pm and after hours call [phone] and ask for the internal medicine resident on call. If you feel you are having a medical emergency please call 911.   Thank you, we look forward to help you remain healthy!  Lars Mage, MD Internal Medicine PGY2

## 2019-02-03 NOTE — Patient Instructions (Signed)
Please keep food record for about 7 days over the next 2 weeks.  Please measure foods so you know exactly how much you are eating and drinking (water)  Consider bring in empty container and labels.  Butch Penny [phone] .

## 2019-02-04 NOTE — Assessment & Plan Note (Signed)
The patient's blood pressure during this visit was 139/65. The patient is currently taking losartan-hctz 100-25mg  qd, amlodipine 10mg  qd . His last blood pressure visits are  BP Readings from Last 3 Encounters:  02/03/19 139/65  01/30/19 125/78  01/26/19 (!) 157/72   Assessment and Plan Continue current regimen.

## 2019-02-04 NOTE — Assessment & Plan Note (Signed)
Patient continues to work on her weight loss. I applauded her on losing 5lbs since last December and encouranged her to continue working on lifestyle modifications. She has been working with nutritionist Butch Penny Pyler).

## 2019-02-04 NOTE — Assessment & Plan Note (Signed)
Was seen by Vascular surgery on 01/30/19 and was thought to have lymphedema based on ultrasound findings. She was recommended to wear appropriate sized compression stockings.

## 2019-02-04 NOTE — Assessment & Plan Note (Addendum)
Patient refuses to take statin due to muscle aches. Recommended continuing zetia.

## 2019-02-12 ENCOUNTER — Other Ambulatory Visit: Payer: Self-pay | Admitting: Internal Medicine

## 2019-02-12 DIAGNOSIS — R42 Dizziness and giddiness: Secondary | ICD-10-CM

## 2019-02-12 DIAGNOSIS — G8929 Other chronic pain: Secondary | ICD-10-CM

## 2019-02-14 ENCOUNTER — Telehealth: Payer: Self-pay | Admitting: Dietician

## 2019-02-14 NOTE — Telephone Encounter (Signed)
Thank you for letting me know

## 2019-02-14 NOTE — Telephone Encounter (Signed)
Spoke with patient about appointment cancellations. Answered her questions and concerns about diet and nutrition for weight loss. She has been keeping food records and they are helping her become more aware of her choices. encouragement and support provided.  Amy Jarvis, RD 02/14/2019 1:54 PM.

## 2019-02-16 ENCOUNTER — Ambulatory Visit: Payer: Medicare Other | Admitting: Dietician

## 2019-02-20 NOTE — Progress Notes (Signed)
Internal Medicine Clinic Attending  Case discussed with Dr. Chundi at the time of the visit.  We reviewed the resident's history and exam and pertinent patient test results.  I agree with the assessment, diagnosis, and plan of care documented in the resident's note. 

## 2019-03-22 ENCOUNTER — Other Ambulatory Visit: Payer: Self-pay

## 2019-03-22 ENCOUNTER — Ambulatory Visit: Payer: Medicare Other | Admitting: Dietician

## 2019-03-22 NOTE — Progress Notes (Signed)
Telephone visit due to COVID-19. Behavioral Therapy for Weight loss (visit #2 ) :  1.Amy Jarvis reports last weight was 322#. She feels her weight is decreasing. When asked Why do you want to lose weight? She replied "My health is getting worse with this weight". be able to be more physical, help my foot problems and blood pressure.  Breakfast 2 cups raisin bran, 2% milk, bottle of water, measuring foods sometimes.  Activity- stretching 30 minutes a day, helping with the pain   2. Dietary (nutritional) assessment: she is eating breakfast now because she is getting up earlier. Thinks she snacks more often and her portions are too large.  3. Intensive behavioral counseling and behavioral therapy to promote sustained  weight loss 1. Assessment of behavioral health risks and factors affecting choice of behavior goals: rates readiness to change a "5", confidence"up a notch". Having to makes changes for covid-19 pandemic like caring for her grandchildren 2. Advise provided today: what she needs to do to meet goal of weight loss, lower blood pressure, suggested DASH diet principles for blood pressure and weight loss.Suggested reading labels for calories and portion sizes.  3. Collaboratively set goals: eat less or different snacks, eat more fruits and vegetables 4. Assist: discussed effects of increased fruit and vegetables on blood pressure 5. Arrange follow up:4 [place], RD 03/22/2019 4:03 PM.

## 2019-03-22 NOTE — Patient Instructions (Addendum)
DASH Eating Plan (the "5 vegetables and fruits a day" plan)  DASH stands for "Dietary Approaches to Stop Hypertension." The DASH eating plan is a healthy eating plan that has been shown to reduce high blood pressure (hypertension). It may also reduce your risk for type 2 diabetes, heart disease, and stroke. The DASH eating plan may also help with weight loss.  What are tips for following this plan?  General guidelines  Avoid eating more than 2,300 mg (milligrams) of salt (sodium) a day. If you have hypertension, you may need to reduce your sodium intake to 1,500 mg a day.   Limit alcohol intake to no more than 1 drink a day for nonpregnant women and 2 drinks a day for men. One drink equals 12 oz of beer, 5 oz of wine, or 1 oz of hard liquor.   Get at least 30 minutes of exercise that causes your heart to beat faster (aerobic exercise) most days of the week. Activities may include walking, swimming, or biking.  Reading food labels   Check food labels for the amount of sodium per serving. Choose foods with less than 5 percent of the Daily Value of sodium. Generally, foods with less than 300 mg of sodium per serving fit into this eating plan.  To find whole grains, look for the word "whole" as the first word in the ingredient list.  Shopping  Buy products labeled as "low-sodium" or "no salt added."  Buy fresh foods. Avoid canned foods and premade or frozen meals.  Cooking  Avoid adding salt when cooking. Use salt-free seasonings or herbs instead of table salt or sea salt. Check with your health care provider or pharmacist before using salt substitutes.  Do not fry foods. Cook foods using healthy methods such as baking, boiling, grilling, and broiling instead.  Cook with heart-healthy oils, such as olive, canola, soybean, or sunflower oil.  Meal planning  Eat a balanced diet that includes: ? 5 or more servings of fruits and vegetables each day. ?  At each meal, try to fill half  of your plate with fruits and vegetables. ? Up to 6-8 servings of whole grains each day. ? Less than 6 oz of lean meat, poultry, or fish each day. A 3-oz serving of meat is about the same size as a deck of cards. One egg equals 1 oz. ? 2 servings of low-fat dairy each day. ? A serving of nuts, seeds, or beans 5 times each week. ? Heart-healthy fats. Healthy fats called Omega-3 fatty acids are found in foods such as flaxseeds and coldwater fish, like sardines, salmon, and mackerel.   Limit how much you eat of the following: ? Canned or prepackaged foods. ? Food that is high in trans fat, such as fried foods. ? Food that is high in saturated fat, such as fatty meat. ? Sweets, desserts, sugary drinks, and other foods with added sugar. ? Full-fat dairy products.  Do not salt foods before eating.  Try to eat at least 2 vegetarian meals each week.  Eat more home-cooked food and less restaurant, buffet, and fast food.  When eating at a restaurant, ask that your food be prepared with less salt or no salt, if possible.  What foods are recommended? . Grains Whole-grain or whole-wheat bread. Whole-grain or whole-wheat pasta. Brown rice. [place]. Bulgur. Whole-grain and low-sodium cereals. Pita bread. Low-fat, low-sodium crackers. Whole-wheat flour tortillas. Vegetables Fresh or frozen vegetables (raw, steamed, roasted, or grilled). Low-sodium or reduced-sodium tomato and  vegetable juice. Low-sodium or reduced-sodium tomato sauce and tomato paste. Low-sodium or reduced-sodium canned vegetables. Fruits All fresh, dried, or frozen fruit. Canned fruit in natural juice (without added sugar). Meat and other protein foods Skinless chicken or [place]. Ground chicken or [place]. Pork with fat trimmed off. Fish and seafood. Egg whites. Dried beans, peas, or lentils. Unsalted nuts, nut butters, and seeds. Unsalted canned beans. Lean cuts of beef with fat trimmed off. Low-sodium, lean deli meat.  Dairy Low-fat (1%) or fat-free (skim) milk. Fat-free, low-fat, or reduced-fat cheeses. Nonfat, low-sodium ricotta or cottage cheese. Low-fat or nonfat yogurt. Low-fat, low-sodium cheese. Fats and oils Soft margarine without trans fats. Vegetable oil. Low-fat, reduced-fat, or light mayonnaise and salad dressings (reduced-sodium). Canola, safflower, olive, soybean, and sunflower oils. Avocado. Seasoning and other foods Herbs. Spices. Seasoning mixes without salt. Unsalted popcorn and pretzels. Fat-free sweets.   What foods are not recommended?  Grains Baked goods made with fat, such as croissants, muffins, or some breads. Dry pasta or rice meal packs. Vegetables Creamed or fried vegetables. Vegetables in a cheese sauce. Regular canned vegetables (not low-sodium or reduced-sodium). Regular canned tomato sauce and paste (not low-sodium or reduced-sodium). Regular tomato and vegetable juice (not low-sodium or reduced-sodium). Angie Fava. Olives. Fruits Canned fruit in a light or heavy syrup. Fried fruit. Fruit in cream or butter sauce. Meat and other protein foods Fatty cuts of meat. Ribs. Fried meat. Berniece Salines. Sausage. Bologna and other processed lunch meats. Salami. Fatback. Hotdogs. Bratwurst. Salted nuts and seeds. Canned beans with added salt. Canned or smoked fish. Whole eggs or egg yolks. Chicken or [place] with skin. Dairy Whole or 2% milk, cream, and half-and-half. Whole or full-fat cream cheese. Whole-fat or sweetened yogurt. Full-fat cheese. Nondairy creamers. Whipped toppings. Processed cheese and cheese spreads. Fats and oils Butter. Stick margarine. Lard. Shortening. Ghee. Bacon fat. Tropical oils, such as coconut, palm kernel, or palm oil. Seasoning and other foods Salted popcorn and pretzels. Onion salt, garlic salt, seasoned salt, table salt, and sea salt. Worcestershire sauce. Tartar sauce. Barbecue sauce. Teriyaki sauce. Soy sauce, including reduced-sodium. Steak sauce. Canned and  packaged gravies. Fish sauce. Oyster sauce. Cocktail sauce. Horseradish that you find on the shelf. Ketchup. Mustard. Meat flavorings and tenderizers. Bouillon cubes. Hot sauce and Tabasco sauce. Premade or packaged marinades. Premade or packaged taco seasonings. Relishes. Regular salad dressings.  Where to find more information:  National Heart, Lung, and [place]: https://wilson-eaton.com/  American Heart Association: www.heart.org  Summary  The DASH eating plan is a healthy eating plan that has been shown to reduce high blood pressure (hypertension). It may also reduce your risk for type 2 diabetes, heart disease, and stroke.  With the DASH eating plan, you should limit salt (sodium) intake to 2,300 mg a day.  When on the DASH eating plan, aim to eat more fresh fruits and vegetables, whole grains, lean proteins, low-fat dairy, and heart-healthy fats.   Call anytime with questions or concerns.   Butch Penny [phone]

## 2019-04-18 ENCOUNTER — Other Ambulatory Visit: Payer: Self-pay | Admitting: Internal Medicine

## 2019-04-18 DIAGNOSIS — K219 Gastro-esophageal reflux disease without esophagitis: Secondary | ICD-10-CM

## 2019-04-25 ENCOUNTER — Ambulatory Visit: Payer: Medicare Other | Admitting: Dietician

## 2019-04-25 ENCOUNTER — Telehealth: Payer: Self-pay | Admitting: Dietician

## 2019-04-25 NOTE — Telephone Encounter (Signed)
Says she got a bill and cannot afford to see me. Cancelled appointment for today

## 2019-05-03 ENCOUNTER — Other Ambulatory Visit: Payer: Self-pay | Admitting: Internal Medicine

## 2019-05-03 DIAGNOSIS — G8929 Other chronic pain: Secondary | ICD-10-CM

## 2019-05-03 DIAGNOSIS — R42 Dizziness and giddiness: Secondary | ICD-10-CM

## 2019-05-08 ENCOUNTER — Other Ambulatory Visit: Payer: Self-pay | Admitting: Internal Medicine

## 2019-05-26 ENCOUNTER — Telehealth: Payer: Self-pay

## 2019-05-26 NOTE — Telephone Encounter (Signed)
Please call pt back regarding stomach pain.

## 2019-05-26 NOTE — Telephone Encounter (Signed)
Received TC from patient, she is c/o of RLQ and LLQ pain for > 1 week.  States onset was last Thursday, was intermittent, sharp X 2 days and then subsided.  This week she started having RLQ/LLQ intermittent pain again, Pt states "I have a problem with constipation and I have diverticulitis".  States pain remains intermittent, taking tylenol with relief.  LBM was last night which she reports as normal, soft and medium amount. No distress noted on phone, pt laughing with nurse during conversation. No appt's available today in [facility], offered pt appt Monday in [facility], pt agreeable and appt made for 6/29 @ 2:45.  Instructed pt to push fluids and if pain worsens or she is unable to have BM or experiences N/V before appt, to present to UC/ER, she verbalized understanding. Will route to PCP for any additional advice. SChaplin, RN,BSN

## 2019-05-29 ENCOUNTER — Ambulatory Visit (INDEPENDENT_AMBULATORY_CARE_PROVIDER_SITE_OTHER): Payer: Medicare Other | Admitting: Internal Medicine

## 2019-05-29 ENCOUNTER — Other Ambulatory Visit: Payer: Self-pay | Admitting: Internal Medicine

## 2019-05-29 ENCOUNTER — Other Ambulatory Visit: Payer: Self-pay

## 2019-05-29 DIAGNOSIS — M25552 Pain in left hip: Secondary | ICD-10-CM

## 2019-05-29 DIAGNOSIS — M25559 Pain in unspecified hip: Secondary | ICD-10-CM | POA: Insufficient documentation

## 2019-05-29 MED ORDER — MELOXICAM 15 MG PO TABS: 7.5000 mg | ORAL_TABLET | Freq: Every day | ORAL | 0 refills | Status: DC

## 2019-05-29 MED ORDER — KETOROLAC TROMETHAMINE 30 MG/ML IJ SOLN
30.0000 mg | Freq: Once | INTRAMUSCULAR | Status: AC
  Administered 2019-05-29: 30 mg via INTRAMUSCULAR

## 2019-05-29 NOTE — Assessment & Plan Note (Signed)
Patient presents for what was originally described as abdominal pain but upon discussion is located more so in her groin, hip, and sacrum.  She call and described LLQ and RLQ on Friday and was scheduled for follow up today. Pain had been intermittent for about a week and has become constant in her L inguinal region since Saturday. RLQ is typically of her chronic constipation and is no longer present. LLQ pain is new and patient was concerned of diverticulitis. On interview and on exam, pain was revealed to be Left Inguinal, Deep and radiating around her left hip to her left sacroiliac joint. No tenderness to palpation of abdomen, no peritoneal signs, no fever. She has been having regular BMs with her chronic stool softeners.   Her pain is worsened with standing fully upright or significant movements at her left hip. Hip joint itself is not tender. Pain is improved from 8 to 5 or 6 out of 10 with scheduled Ibuprofen.  Presentation is consistent with musculoskeletal pain, possibly involving the Psoas muscle and SI joint. Will treat with antiinflammatories, rest, avoidance of exacerbating factors, and referral to PT. Will also give Toradol injection to help relieve current pain. - Hold Ibuprofen - Mobic 15mg  Daily for 2 weeks - Toradol injection today - Rest, Avoid Painful activities, Apply Heat - PT referral

## 2019-05-29 NOTE — Progress Notes (Signed)
   CC: Left SI pain, left inguinal pain, left hip pain  HPI:  Amy Jarvis is a 58 y.o. F with PMHx listed below presenting for Left SI pain, left inguinal pain, left hip pain. Please see the A&P for the status of the patient's chronic medical problems.  Past Medical History:  Diagnosis Date  . Allergic rhinitis 09/23/2015  . Carpal tunnel syndrome, bilateral   . Cervical polyp    possible history of cervical polyp  . Complication of anesthesia    low bp after hysterctomy  . Cystocele 01/13/2010  . DDD (degenerative disc disease), lumbar   . Degenerative disk disease 10/07/2012  . Degenerative lumbar spinal stenosis   . Diverticulosis 03/26/2010  . GERD 09/05/2009  . Hyperlipidemia LDL goal < 130 09/25/2008  . Hypertension 09/25/2008  . Microscopic hematuria 01/13/2010  . Morbid obesity 08/28/2010  . Obstructive sleep apnea on CPAP 06/20/2010   cpap   . Plantar fasciitis   . Pneumonia    6/16  . Uterine prolapse 11/19/2008  . Vertigo 10/07/2012   Review of Systems:  Performed and all others negative.  Physical Exam:  Vitals:   05/29/19 1440  BP: (!) 159/76  Pulse: 81  Temp: 98.3 F (36.8 C)  TempSrc: Oral  Weight: (!) 324 lb 9.6 oz (147.2 kg)  Height: 5\' 9"  (1.753 m)   Physical Exam Constitutional:      General: She is not in acute distress.    Appearance: Normal appearance. She is obese.  Cardiovascular:     Rate and Rhythm: Normal rate and regular rhythm.     Pulses: Normal pulses.     Heart sounds: Normal heart sounds.  Pulmonary:     Effort: Pulmonary effort is normal. No respiratory distress.     Breath sounds: Normal breath sounds.  Abdominal:     General: Bowel sounds are normal. There is no distension.     Palpations: Abdomen is soft. There is no mass.     Tenderness: There is no abdominal tenderness. There is no guarding or rebound.     Hernia: No hernia is present.  Musculoskeletal:        General: Tenderness present. No swelling or deformity.      Comments: Tenderness to deep palpation of Left Inguinal region and over left SI joint  Skin:    General: Skin is warm and dry.  Neurological:     General: No focal deficit present.     Mental Status: Mental status is at baseline.    Assessment & Plan:   See Encounters Tab for problem based charting.  Patient discussed with Dr. Rebeca Alert

## 2019-05-29 NOTE — Patient Instructions (Signed)
Thank you for allowing [name] to care for you  For your Groin, Hip, and Low back pain - Toradol injection today - Please take Mobic 15mg  Daily for 2 weeks - Apply heat to the affected area - Referral to Physical Therapy placed

## 2019-05-30 ENCOUNTER — Other Ambulatory Visit: Payer: Self-pay | Admitting: Internal Medicine

## 2019-05-30 DIAGNOSIS — M25552 Pain in left hip: Secondary | ICD-10-CM

## 2019-05-30 NOTE — Telephone Encounter (Signed)
Rx is for 14-15 day course. Updated to reflect intended dose and duration. Not intended for patient to take chronically.

## 2019-05-30 NOTE — Progress Notes (Signed)
Internal Medicine Clinic Attending  Case discussed with Dr. Melvin at the time of the visit.  We reviewed the resident's history and exam and pertinent patient test results.  I agree with the assessment, diagnosis, and plan of care documented in the resident's note.  Alexander Raines, M.D., Ph.D.  

## 2019-06-06 ENCOUNTER — Encounter: Payer: Self-pay | Admitting: *Deleted

## 2019-06-07 ENCOUNTER — Other Ambulatory Visit: Payer: Self-pay | Admitting: Internal Medicine

## 2019-06-07 DIAGNOSIS — L309 Dermatitis, unspecified: Secondary | ICD-10-CM

## 2019-06-07 DIAGNOSIS — J309 Allergic rhinitis, unspecified: Secondary | ICD-10-CM

## 2019-06-07 DIAGNOSIS — J019 Acute sinusitis, unspecified: Secondary | ICD-10-CM

## 2019-06-07 DIAGNOSIS — M722 Plantar fascial fibromatosis: Secondary | ICD-10-CM

## 2019-06-07 MED ORDER — FLUTICASONE PROPIONATE 50 MCG/ACT NA SUSP: NASAL | 2 refills | Status: DC

## 2019-06-07 NOTE — Telephone Encounter (Signed)
Please schedule an appt with me

## 2019-06-23 ENCOUNTER — Ambulatory Visit: Payer: Medicare Other | Admitting: Physical Therapy

## 2019-07-03 ENCOUNTER — Ambulatory Visit: Payer: Medicare Other

## 2019-07-10 NOTE — Progress Notes (Signed)
   CC: HTN follow up  HPI:  [name] is a 58 y.o. female with essential htn, osa on cpap, chronic pain syndrome, hld who presents for follow up of htn. Please see problem based charting for evaluation, assessment, and plan.  Past Medical History:  Diagnosis Date  . Allergic rhinitis 09/23/2015  . Carpal tunnel syndrome, bilateral   . Cervical polyp    possible history of cervical polyp  . Complication of anesthesia    low bp after hysterctomy  . Cystocele 01/13/2010  . DDD (degenerative disc disease), lumbar   . Degenerative disk disease 10/07/2012  . Degenerative lumbar spinal stenosis   . Diverticulosis 03/26/2010  . GERD 09/05/2009  . Hyperlipidemia LDL goal < 130 09/25/2008  . Hypertension 09/25/2008  . Microscopic hematuria 01/13/2010  . Morbid obesity 08/28/2010  . Obstructive sleep apnea on CPAP 06/20/2010   cpap   . Plantar fasciitis   . Pneumonia    6/16  . Uterine prolapse 11/19/2008  . Vertigo 10/07/2012   Review of Systems:    Review of Systems  Constitutional: Negative for chills and fever.  Respiratory: Negative for cough.   Cardiovascular: Negative for chest pain.  Gastrointestinal: Negative for abdominal pain, nausea and vomiting.  Neurological: Negative for dizziness and headaches.   Physical Exam:  Vitals:   07/11/19 1353 07/11/19 1354  BP:  126/67  Pulse:  80  Temp:  98.4 F (36.9 C)  TempSrc:  Oral  SpO2:  98%  Weight: (!) 328 lb 1.6 oz (148.8 kg)   Height: 5' 6.5" (1.689 m)    Physical Exam  Constitutional: Appears well-developed and well-nourished. No distress.  HENT:  Head: Normocephalic and atraumatic.  Eyes: Conjunctivae are normal.  Cardiovascular: Normal rate, regular rhythm and normal heart sounds.  Respiratory: Effort normal and breath sounds normal. No respiratory distress. No wheezes.  GI: Soft. Bowel sounds are normal. No distension. There is no tenderness.  Musculoskeletal: No edema. 5/5 muscle strength in bilateral  lower extremities Neurological: Is alert.  Skin: Not diaphoretic. No erythema.  Psychiatric: Normal mood and affect. Behavior is normal. Judgment and thought content normal.    Assessment & Plan:   See Encounters Tab for problem based charting.  Patient discussed with Dr. Evette Doffing

## 2019-07-11 ENCOUNTER — Encounter: Payer: Self-pay | Admitting: Internal Medicine

## 2019-07-11 ENCOUNTER — Other Ambulatory Visit: Payer: Self-pay

## 2019-07-11 ENCOUNTER — Ambulatory Visit (INDEPENDENT_AMBULATORY_CARE_PROVIDER_SITE_OTHER): Payer: Medicare Other | Admitting: Internal Medicine

## 2019-07-11 VITALS — BP 126/67 | HR 80 | Temp 98.4°F | Ht 66.5 in | Wt 328.1 lb

## 2019-07-11 DIAGNOSIS — K5900 Constipation, unspecified: Secondary | ICD-10-CM

## 2019-07-11 DIAGNOSIS — Z6841 Body Mass Index (BMI) 40.0 and over, adult: Secondary | ICD-10-CM

## 2019-07-11 DIAGNOSIS — Z9989 Dependence on other enabling machines and devices: Secondary | ICD-10-CM

## 2019-07-11 DIAGNOSIS — E785 Hyperlipidemia, unspecified: Secondary | ICD-10-CM

## 2019-07-11 DIAGNOSIS — M25552 Pain in left hip: Secondary | ICD-10-CM

## 2019-07-11 DIAGNOSIS — Z79899 Other long term (current) drug therapy: Secondary | ICD-10-CM

## 2019-07-11 DIAGNOSIS — G894 Chronic pain syndrome: Secondary | ICD-10-CM

## 2019-07-11 DIAGNOSIS — Z0000 Encounter for general adult medical examination without abnormal findings: Secondary | ICD-10-CM

## 2019-07-11 DIAGNOSIS — K219 Gastro-esophageal reflux disease without esophagitis: Secondary | ICD-10-CM

## 2019-07-11 DIAGNOSIS — G4733 Obstructive sleep apnea (adult) (pediatric): Secondary | ICD-10-CM | POA: Diagnosis not present

## 2019-07-11 DIAGNOSIS — I10 Essential (primary) hypertension: Secondary | ICD-10-CM

## 2019-07-11 NOTE — Patient Instructions (Signed)
It was a pleasure to see you today [name]. Please make the following changes:  -please continue taking your current medication  -drink plenty of water and exercise -please follow up in 3 months   If you have any questions or concerns, please call our clinic at [phone] between 9am-5pm and after hours call [phone] and ask for the internal medicine resident on call. If you feel you are having a medical emergency please call 911.   Thank you, we look forward to help you remain healthy!  Lars Mage, MD Internal Medicine PGY3   Preventing Complications From Unhealthy Weight Loss Behaviors, Adult Reaching and maintaining a healthy weight is important for your overall health. It is natural to want to lose weight quickly, using whatever methods seem fastest. However, losing weight in a healthy way is not a quick process. Instead, aim for slow, steady weight loss. What lifestyle changes can be made? You can make certain lifestyle changes to help you lose weight in a healthy way. These include eating nutritious foods and exercising regularly. What to avoid:  Following a diet that restricts entire types of food.  Skipping meals to save calories. It is especially important to eat breakfast.  Not eating anything for long periods of time (fasting).  Restricting your calories to far less than the amount that you need to lose or maintain a healthy weight.  Compulsively getting an extreme amount of exercise.  Taking laxative pills to make you have more frequent bowel movements.  Taking medicines to make your body lose excess fluids (diuretics).  Eating an excessive amount of food (bingeing), then making yourself vomit (purging). Healthy behaviors:   Eat a variety of healthy foods, including: ? Fruits and vegetables. ? Whole grains. ? Lean proteins. ? Low-fat dairy products.  Drink water instead of sugary drinks.  Plan healthy, low-calorie meals. Work with a Administrator, [employer] (dietitian) to make a healthy meal plan that works for you and to make an exercise program. ? Include different types of exercise in your exercise program, such as strengthening, aerobic, and flexibility exercises. ? To maintain your weight, get at least 150 minutes of moderate-intensity exercise every week. Moderate-intensity exercise could be brisk walking or biking. ? To lose a healthy amount of weight, get 60 minutes of moderate-intensity exercise each day.  Find ways to reduce stress, such as regular exercise or meditation.  Find a hobby or other activity that you enjoy to distract you from eating when you feel stressed or bored. Why are these changes important? Making these changes will improve your overall health. Maintaining a healthy weight also lowers your risk of certain conditions, including:  Heart disease.  High cholesterol.  High blood pressure.  Type 2 diabetes.  Stroke.  Osteoarthritis.  Osteoporosis.  Some types of cancer.  Breathing and sleeping disorders. What can happen if changes are not made? Using disordered eating or other unhealthy eating behaviors to try to lose weight can cause:  Fatigue.  Imbalances in electrolytes and chemicals that your body needs to work properly.  Organ damage or failure, especially of the kidneys.  Dehydration.  Imbalances in body fluids.  Low heart rate and blood pressure.  Thin bones that break easily.  Social isolation or relationship problems with your friends and family.  Emotional distress, including depression and anxiety.  A greater risk of an eating disorder. If you develop an eating disorder, you could develop serious health problems and complications that affect yourorgans and bodily processes. These include:  Dry skin and hair.  Hair loss.  Fainting.  Difficulty getting pregnant.  Changes in your heart muscle and the way your heart works.  Severe dehydration.  Damage to the  digestive tract and digestive problems.  Long-term (chronic) gastrointestinal problems.  High blood pressure.  High cholesterol.  Heart disease.  Type 2 diabetes. Where to find support For more support, talk with:  Your health care provider or dietitian. Ask about support groups.  A mental health care provider.  Family and friends. Where to find more information Learn more about how to prevent complications from unhealthy weight loss behaviors from:  Centers for Disease Control and Prevention: https://www.cervantes-rivera.net/  [place]: PureMess.co.nz.shtml  National Eating Disorders Association: www.nationaleatingdisorders.org Contact a health care provider if:  You often feel very tired.  You notice changes in your skin or your hair.  You faint because of dehydration or too much exercise.  You struggle to change your unhealthy weight loss behaviors on your own.  Unhealthy weight loss behaviors are affecting your daily life.  You have signs or symptoms of an eating disorder.  You have major weight changes in a short period of time.  You feel guilty or ashamed about eating or exercising.  You have trouble with your relationships because of your weight loss habits. Summary  Aim for slow, steady weight loss by choosing healthy foods, getting enough calories every day, and exercising regularly.  If you cannot make these changes by yourself, or if you think that you may have an eating disorder, contact your health care provider. This information is not intended to replace advice given to you by your health care provider. Make sure you discuss any questions you have with your health care provider. Document Released: 12/01/2015 Document Revised: 10/29/2017 Document Reviewed: 12/01/2015 Elsevier Patient Education  2020 [employer].

## 2019-07-12 ENCOUNTER — Other Ambulatory Visit: Payer: Self-pay

## 2019-07-12 ENCOUNTER — Other Ambulatory Visit: Payer: Self-pay | Admitting: Internal Medicine

## 2019-07-12 DIAGNOSIS — M25552 Pain in left hip: Secondary | ICD-10-CM

## 2019-07-12 DIAGNOSIS — J309 Allergic rhinitis, unspecified: Secondary | ICD-10-CM

## 2019-07-12 DIAGNOSIS — M722 Plantar fascial fibromatosis: Secondary | ICD-10-CM

## 2019-07-12 DIAGNOSIS — K219 Gastro-esophageal reflux disease without esophagitis: Secondary | ICD-10-CM

## 2019-07-12 MED ORDER — ESOMEPRAZOLE MAGNESIUM 40 MG PO CPDR: DELAYED_RELEASE_CAPSULE | ORAL | 0 refills | Status: DC

## 2019-07-12 NOTE — Assessment & Plan Note (Signed)
Patient has been using CPAP nightly and states that she has not had any difficulty with the machine.

## 2019-07-12 NOTE — Telephone Encounter (Signed)
esomeprazole (NEXIUM) 40 MG capsule, refill request @  [id number] - [place], [place] [phone] (Phone) [phone] (Fax)

## 2019-07-12 NOTE — Assessment & Plan Note (Signed)
The patient's blood pressure during this visit was 126/67. The patient is currently being prescribed losartan-hctz 100-25mg  qd, amlodipine 10mg  qd. His last blood pressure visits are   BP Readings from Last 3 Encounters:  07/11/19 126/67  05/29/19 (!) 159/76  02/03/19 139/65   The does not report palpitations, dizziness, chest pain, sob.  Assessment and Plan Patient's blood pressure is well controlled and she should continue current regimen.

## 2019-07-12 NOTE — Progress Notes (Signed)
Internal Medicine Clinic Attending  Case discussed with Dr. Chundi at the time of the visit.  We reviewed the resident's history and exam and pertinent patient test results.  I agree with the assessment, diagnosis, and plan of care documented in the resident's note. 

## 2019-07-12 NOTE — Telephone Encounter (Signed)
Refill refuse. This medication was for short term relief and she was not intended to be on it long term. She discussed her hip pain with Dr. Maricela Bo yesterday, if she is having ongoing pain I will defer to her PCP for further chronic management (refills, follow up, etc) given her concurrent PPI and increase risk of gastritis with NSAIDs.  Thanks  Pearson Grippe

## 2019-07-12 NOTE — Assessment & Plan Note (Signed)
Patient states that her llq abdominal/left hip pain has improved since last visit. She states that she has some accompanied abdominal bloating which she started to increase her Nexium dosage to twice daily. She has also been having constipation and noticed that her stool is pellets occasionally.  Assessment and Plan  -Continue daily stool softener -Encourage more fiber in diet -Encouraged patient to drink plenty of water at least 2 L -Advised patient to increase her Nexium dose due to possible effects.  Continued Nexium 40 mg daily. -Told patient to return to clinic if her worsens

## 2019-07-12 NOTE — Assessment & Plan Note (Signed)
Screening mamogram to be repeated in 1 year  Will give flu shot when available

## 2019-07-12 NOTE — Telephone Encounter (Signed)
nexium rx sent to pharmacy earlier today.  CMA contacted pt and made her aware.  Call also made to pharmacy to confirm they have received it.  No further action needed, phone call complete.Regenia Skeeter, Darlene Cassady8/12/202011:20 AM

## 2019-07-12 NOTE — Assessment & Plan Note (Signed)
Lipid panel with tcholes 251, triglycerides 113, HDL 47, LDL 181.  ASCVD risk score is 6.7%.  Patient is statin intolerant. According to [facility] the patient is in borderline risk category of 5-7.5% and has ASCVD risk enhancers of ckd, persistently elevated ldl>160 for which there is a class IIb recommendation of a risk discussion to be recommended regarding a moderate intensity statin therapy.  We have done a lipid challenge for the patient which she was not able to tolerate. Therefore, we will continue on zetia.   -continue zetia 10mg  qd

## 2019-07-12 NOTE — Addendum Note (Signed)
Addended by: Lalla Brothers T on: 07/12/2019 11:27 AM   Modules accepted: Level of Service

## 2019-07-12 NOTE — Assessment & Plan Note (Signed)
Patient gained 4lbs since last visit 1 month ago. She has been trying to follow Ms. Butch Penny Pyler's advice regarding following healthy diet, but she states that she likes to eat ice-cream during the summer.   Spoke to patient at length regarding the importance of cutting down on her weight and the health risks involved with not doing so. We spoke about her possibly needing bariatric surgery. Patient expressed understanding.   Assessment and plan Encouraged patient to follow healthy lifestyle modifications. Will follow up with patient regarding her weight loss in one month. Will discuss options for weight loss pills and bariatric surgery at that time.

## 2019-07-14 ENCOUNTER — Other Ambulatory Visit: Payer: Self-pay | Admitting: Internal Medicine

## 2019-07-14 DIAGNOSIS — J309 Allergic rhinitis, unspecified: Secondary | ICD-10-CM

## 2019-07-15 ENCOUNTER — Other Ambulatory Visit: Payer: Self-pay | Admitting: Internal Medicine

## 2019-07-29 ENCOUNTER — Other Ambulatory Visit: Payer: Self-pay | Admitting: Internal Medicine

## 2019-08-03 NOTE — Addendum Note (Signed)
Addended by: Hulan Fray on: 08/03/2019 06:22 PM   Modules accepted: Orders

## 2019-08-24 ENCOUNTER — Telehealth: Payer: Self-pay | Admitting: *Deleted

## 2019-08-24 NOTE — Telephone Encounter (Signed)
Spoke with Tabitha at [facility] to cancel 3 refills. Now has only 1 refill on Rx sent 07/31/2019. Hubbard Hartshorn, RN, BSN

## 2019-08-24 NOTE — Telephone Encounter (Signed)
-----   Message from Velora Heckler, RN sent at 08/03/2019 10:17 AM EDT -----  ----- Message ----- From: Lars Mage, MD Sent: 07/31/2019   8:45 PM EDT To: Velora Heckler, RN  Hi Lauren,   For the above patient I accidentally approved gabapentin refill with 3 refills. Can you please change this to 1 refill only please. Thank you!

## 2019-08-29 DIAGNOSIS — G4733 Obstructive sleep apnea (adult) (pediatric): Secondary | ICD-10-CM | POA: Diagnosis not present

## 2019-09-05 ENCOUNTER — Other Ambulatory Visit: Payer: Self-pay

## 2019-09-05 NOTE — Patient Outreach (Signed)
[facility]) Care Management  09/05/2019  Amy Jarvis 03-Nov-1961 [id number]   Medication Adherence call to Amy Jarvis Compliant Voice message left with a call back number. Amy Jarvis is showing past due on Losartan/Hctz 100/25 mg under [place].   [place] Management Direct Dial [phone]  Fax [phone] [name]

## 2019-09-08 ENCOUNTER — Other Ambulatory Visit: Payer: Self-pay

## 2019-09-08 DIAGNOSIS — M25561 Pain in right knee: Secondary | ICD-10-CM | POA: Diagnosis not present

## 2019-09-08 DIAGNOSIS — M722 Plantar fascial fibromatosis: Secondary | ICD-10-CM

## 2019-09-08 NOTE — Telephone Encounter (Signed)
ibuprofen (ADVIL,MOTRIN) 800 MG tablet   Refill request @  [facility] [id number] - [place], [place] [phone] (Phone) [phone] (Fax)

## 2019-09-26 ENCOUNTER — Other Ambulatory Visit: Payer: Self-pay | Admitting: Internal Medicine

## 2019-09-26 DIAGNOSIS — M722 Plantar fascial fibromatosis: Secondary | ICD-10-CM

## 2019-09-26 DIAGNOSIS — K219 Gastro-esophageal reflux disease without esophagitis: Secondary | ICD-10-CM

## 2019-09-26 MED ORDER — IBUPROFEN 800 MG PO TABS: 800.0000 mg | ORAL_TABLET | Freq: Every day | ORAL | 0 refills | Status: DC

## 2019-09-26 NOTE — Telephone Encounter (Signed)
esomeprazole (NEXIUM) 40 MG capsule   ibuprofen (ADVIL,MOTRIN) 800 MG tablet   Refill request @  [facility] [id number] - [place], [place] [phone] (Phone) [phone] (Fax)

## 2019-10-08 ENCOUNTER — Other Ambulatory Visit: Payer: Self-pay | Admitting: Pharmacist

## 2019-10-08 DIAGNOSIS — I10 Essential (primary) hypertension: Secondary | ICD-10-CM

## 2019-10-09 MED ORDER — AMLODIPINE BESYLATE 10 MG PO TABS: ORAL_TABLET | ORAL | 0 refills | Status: DC

## 2019-10-09 MED ORDER — LOSARTAN POTASSIUM-HCTZ 100-25 MG PO TABS: 1.0000 | ORAL_TABLET | Freq: Every day | ORAL | 1 refills | Status: DC

## 2019-10-16 ENCOUNTER — Other Ambulatory Visit: Payer: Self-pay | Admitting: Internal Medicine

## 2019-10-16 DIAGNOSIS — I10 Essential (primary) hypertension: Secondary | ICD-10-CM

## 2019-10-17 ENCOUNTER — Encounter: Payer: Medicare Other | Admitting: Internal Medicine

## 2019-10-17 NOTE — Progress Notes (Deleted)
   CC: ***  HPI:  [name] is a 58 y.o. female with essential htn, osa on cpap, normocytic anemia, chronic pain syndrome who presents for follow up of htn. Please see problem based charting for evaluation, assessment, and plan.   htn  The patient's blood pressure during this visit was ***. The patient is currently taking losartan-hctz 100-25mg  qd, amlodipine10mg  qd. His/her *** last blood pressure visits are  BP Readings from Last 3 Encounters:  07/11/19 126/67  05/29/19 (!) 159/76  02/03/19 139/65    The patient does/does not *** report palpitations, dizziness, chest pain, sob ***.  Assessment and Plan ***   Hm  Influenza vaccination   Past Medical History:  Diagnosis Date  . Allergic rhinitis 09/23/2015  . Carpal tunnel syndrome, bilateral   . Cervical polyp    possible history of cervical polyp  . Complication of anesthesia    low bp after hysterctomy  . Cystocele 01/13/2010  . DDD (degenerative disc disease), lumbar   . Degenerative disk disease 10/07/2012  . Degenerative lumbar spinal stenosis   . Diverticulosis 03/26/2010  . GERD 09/05/2009  . Hyperlipidemia LDL goal < 130 09/25/2008  . Hypertension 09/25/2008  . Microscopic hematuria 01/13/2010  . Morbid obesity 08/28/2010  . Obstructive sleep apnea on CPAP 06/20/2010   cpap   . Plantar fasciitis   . Pneumonia    6/16  . Uterine prolapse 11/19/2008  . Vertigo 10/07/2012   Review of Systems:  ***  Physical Exam:  There were no vitals filed for this visit. ***  Assessment & Plan:   See Encounters Tab for problem based charting.  Patient {GC/GE:[id number]::"discussed with","seen with"} Dr. {NAMES:[id number]::"Butcher","Guilloud","Hoffman","Mullen","Narendra","Raines","Vincent"}

## 2019-10-28 NOTE — Progress Notes (Signed)
   CC: Hypertension follow up   HPI:  Amy Jarvis is a 58 y.o. female with essential htn, osa on cpap, normocytic anemia, chronic pain syndrome who presents for follow up of htn. Please see problem based charting for evaluation, assessment, and plan.   Past Medical History:  Diagnosis Date  . Allergic rhinitis 09/23/2015  . Carpal tunnel syndrome, bilateral   . Cervical polyp    possible history of cervical polyp  . Complication of anesthesia    low bp after hysterctomy  . Cystocele 01/13/2010  . DDD (degenerative disc disease), lumbar   . Degenerative disk disease 10/07/2012  . Degenerative lumbar spinal stenosis   . Diverticulosis 03/26/2010  . GERD 09/05/2009  . Hyperlipidemia LDL goal < 130 09/25/2008  . Hypertension 09/25/2008  . Microscopic hematuria 01/13/2010  . Morbid obesity 08/28/2010  . Obstructive sleep apnea on CPAP 06/20/2010   cpap   . Plantar fasciitis   . Pneumonia    6/16  . Uterine prolapse 11/19/2008  . Vertigo 10/07/2012   Review of Systems:    Review of Systems  Constitutional: Negative for chills and fever.  Cardiovascular: Negative for chest pain and palpitations.  Gastrointestinal: Negative for abdominal pain, nausea and vomiting.  Genitourinary:       Nocturia Denies polyuria and polydipsia  Musculoskeletal: Positive for joint pain.  Neurological: Negative for dizziness and headaches.   Physical Exam:  Vitals:   10/31/19 1513 10/31/19 1519  BP: (!) 153/70 134/60  Pulse: 94 86  Temp: 98.1 F (36.7 C)   TempSrc: Oral   SpO2: 100%   Weight: (!) 339 lb (153.8 kg)   Height: 5' 6.5" (1.689 m)    Physical Exam  Constitutional: Appears well-developed and well-nourished. No distress.  HENT:  Head: Normocephalic and atraumatic.  Eyes: Conjunctivae are normal.  Cardiovascular: Normal rate, regular rhythm and normal heart sounds.  Respiratory: Effort normal and breath sounds normal. No respiratory distress. No wheezes.  GI: Soft.  Bowel sounds are normal. No distension. There is no tenderness.  Musculoskeletal: No edema.  Neurological: Is alert.  Skin: Not diaphoretic. No erythema.  Psychiatric: Normal mood and affect. Behavior is normal. Judgment and thought content normal.    Assessment & Plan:   See Encounters Tab for problem based charting.  Patient discussed with Dr. Philipp Ovens

## 2019-10-31 ENCOUNTER — Encounter: Payer: Self-pay | Admitting: Internal Medicine

## 2019-10-31 ENCOUNTER — Other Ambulatory Visit: Payer: Self-pay

## 2019-10-31 ENCOUNTER — Ambulatory Visit (INDEPENDENT_AMBULATORY_CARE_PROVIDER_SITE_OTHER): Payer: Medicare Other | Admitting: Internal Medicine

## 2019-10-31 VITALS — BP 134/60 | HR 86 | Temp 98.1°F | Ht 66.5 in | Wt 339.0 lb

## 2019-10-31 DIAGNOSIS — M722 Plantar fascial fibromatosis: Secondary | ICD-10-CM

## 2019-10-31 DIAGNOSIS — I10 Essential (primary) hypertension: Secondary | ICD-10-CM

## 2019-10-31 DIAGNOSIS — L309 Dermatitis, unspecified: Secondary | ICD-10-CM

## 2019-10-31 DIAGNOSIS — R7303 Prediabetes: Secondary | ICD-10-CM

## 2019-10-31 DIAGNOSIS — Z23 Encounter for immunization: Secondary | ICD-10-CM | POA: Diagnosis not present

## 2019-10-31 DIAGNOSIS — G894 Chronic pain syndrome: Secondary | ICD-10-CM | POA: Diagnosis not present

## 2019-10-31 DIAGNOSIS — Z9989 Dependence on other enabling machines and devices: Secondary | ICD-10-CM

## 2019-10-31 DIAGNOSIS — G4733 Obstructive sleep apnea (adult) (pediatric): Secondary | ICD-10-CM | POA: Diagnosis not present

## 2019-10-31 DIAGNOSIS — D649 Anemia, unspecified: Secondary | ICD-10-CM | POA: Diagnosis not present

## 2019-10-31 DIAGNOSIS — Z6841 Body Mass Index (BMI) 40.0 and over, adult: Secondary | ICD-10-CM

## 2019-10-31 DIAGNOSIS — R351 Nocturia: Secondary | ICD-10-CM

## 2019-10-31 DIAGNOSIS — Z0000 Encounter for general adult medical examination without abnormal findings: Secondary | ICD-10-CM

## 2019-10-31 DIAGNOSIS — Z79899 Other long term (current) drug therapy: Secondary | ICD-10-CM

## 2019-10-31 LAB — POCT GLYCOSYLATED HEMOGLOBIN (HGB A1C): Hemoglobin A1C: 5.9 % — AB (ref 4.0–5.6)

## 2019-10-31 LAB — GLUCOSE, CAPILLARY: Glucose-Capillary: 84 mg/dL (ref 70–99)

## 2019-10-31 MED ORDER — POTASSIUM CHLORIDE CRYS ER 20 MEQ PO TBCR: EXTENDED_RELEASE_TABLET | ORAL | 0 refills | Status: DC

## 2019-10-31 MED ORDER — HYDROCORTISONE 2.5 % EX OINT: TOPICAL_OINTMENT | CUTANEOUS | 0 refills | Status: DC

## 2019-10-31 MED ORDER — IBUPROFEN 800 MG PO TABS: 800.0000 mg | ORAL_TABLET | Freq: Every day | ORAL | 0 refills | Status: DC

## 2019-10-31 MED ORDER — DICLOFENAC SODIUM 1 % EX GEL: 2.0000 g | Freq: Four times a day (QID) | CUTANEOUS | 0 refills | Status: DC

## 2019-10-31 NOTE — Patient Instructions (Signed)
It was a pleasure to see you today Amy Jarvis. Please make the following changes:  -please continue to use all the medications listed on this form  -I will inquire about whether victoza will be covered for you as a weight loss medication  -please continue to exercise   If you have any questions or concerns, please call our clinic at [phone] between 9am-5pm and after hours call [phone] and ask for the internal medicine resident on call. If you feel you are having a medical emergency please call 911.   Thank you, we look forward to help you remain healthy!  Lars Mage, MD Internal Medicine PGY3

## 2019-11-01 LAB — BMP8+ANION GAP
Anion Gap: 10 mmol/L (ref 10.0–18.0)
BUN/Creatinine Ratio: 24 — ABNORMAL HIGH (ref 9–23)
BUN: 15 mg/dL (ref 6–24)
CO2: 27 mmol/L (ref 20–29)
Calcium: 10 mg/dL (ref 8.7–10.2)
Chloride: 97 mmol/L (ref 96–106)
Creatinine, Ser: 0.62 mg/dL (ref 0.57–1.00)
GFR calc Af Amer: 114 mL/min/{1.73_m2} (ref >59)
GFR calc non Af Amer: 99 mL/min/{1.73_m2} (ref >59)
Glucose: 80 mg/dL (ref 65–99)
Potassium: 3.3 mmol/L — ABNORMAL LOW (ref 3.5–5.2)
Sodium: 134 mmol/L (ref 134–144)

## 2019-11-02 DIAGNOSIS — R7303 Prediabetes: Secondary | ICD-10-CM | POA: Insufficient documentation

## 2019-11-02 MED ORDER — LIRAGLUTIDE 18 MG/3ML ~~LOC~~ SOPN: PEN_INJECTOR | SUBCUTANEOUS | 1 refills | Status: DC

## 2019-11-02 NOTE — Assessment & Plan Note (Signed)
The patient has gained 11lbs in the past 4 months. She has a bmi of 42 which places her in the morbid range.   Will check HbA1c and bmp to further evaluate for metabolic syndrome.

## 2019-11-02 NOTE — Assessment & Plan Note (Signed)
The patient's blood pressure during this visit was 153/70 and repeat 134/60. The patient is currently taking losartan-hctz 100-25mg  qd, amlodipine10mg  qd. Her last blood pressure visits are   BP Readings from Last 3 Encounters:  10/31/19 134/60  07/11/19 126/67  05/29/19 (!) 159/76   Assessment and Plan Blood pressure is well controlled. Continue current regimen.

## 2019-11-02 NOTE — Progress Notes (Signed)
Internal Medicine Clinic Attending  Case discussed with Dr. Chundi at the time of the visit.  We reviewed the resident's history and exam and pertinent patient test results.  I agree with the assessment, diagnosis, and plan of care documented in the resident's note. 

## 2019-11-02 NOTE — Assessment & Plan Note (Signed)
Influenza vaccination was given during this encounter

## 2019-11-02 NOTE — Assessment & Plan Note (Signed)
Patient has been adherent with her cpap nightly.

## 2019-11-02 NOTE — Assessment & Plan Note (Signed)
The patient's a1c is 5.9 placing her in prediabetic range. Will prescribe victoza with a 0.6mg  increase weekly till reach 1.8mg . I called patient and explained instructions. She was agreeable.

## 2019-11-03 MED ORDER — SAXENDA 18 MG/3ML ~~LOC~~ SOPN: 1.8000 mg | PEN_INJECTOR | Freq: Every day | SUBCUTANEOUS | 1 refills | Status: DC

## 2019-11-03 NOTE — Addendum Note (Signed)
Addended by: Forde Dandy on: 11/03/2019 04:56 PM   Modules accepted: Orders

## 2019-11-03 NOTE — Addendum Note (Signed)
Addended by: Forde Dandy on: 11/03/2019 03:14 PM   Modules accepted: Orders

## 2019-11-07 ENCOUNTER — Telehealth: Payer: Self-pay | Admitting: Internal Medicine

## 2019-11-07 NOTE — Telephone Encounter (Signed)
Pt medicine needs prior authorization on liraglutide (VICTOZA) 18 MG/3ML SOPN; pt contact [phone]

## 2019-11-08 ENCOUNTER — Telehealth: Payer: Self-pay | Admitting: *Deleted

## 2019-11-08 NOTE — Telephone Encounter (Addendum)
Information was sent through CoverMyMeds for [place] for Saxenda.  Call to patient to verify that she has not used a weight loss medication before.    Awaiting determination within 72 hours.  Sander Nephew, RN 11/08/2019 10:55 AM. Fax from [employer] denied.  Patient's insurance does not cover medications for weight loss or anorexia.  Message to be sent to Dr. Maricela Bo.  Sander Nephew, RN 11/08/2019 3:56 PM.

## 2019-11-09 ENCOUNTER — Encounter: Payer: Self-pay | Admitting: Internal Medicine

## 2019-11-10 NOTE — Telephone Encounter (Signed)
Patient's insurance does not cover any meds for weight loss.Message was sent to Dr. Maricela Bo.

## 2019-12-10 ENCOUNTER — Other Ambulatory Visit: Payer: Self-pay | Admitting: Internal Medicine

## 2019-12-10 DIAGNOSIS — I10 Essential (primary) hypertension: Secondary | ICD-10-CM

## 2019-12-16 ENCOUNTER — Encounter: Payer: Self-pay | Admitting: Internal Medicine

## 2019-12-18 ENCOUNTER — Encounter: Payer: Self-pay | Admitting: Internal Medicine

## 2019-12-18 DIAGNOSIS — K219 Gastro-esophageal reflux disease without esophagitis: Secondary | ICD-10-CM

## 2019-12-20 MED ORDER — ESOMEPRAZOLE MAGNESIUM 40 MG PO CPDR: DELAYED_RELEASE_CAPSULE | ORAL | 0 refills | Status: DC

## 2019-12-20 MED ORDER — "PEN NEEDLES 5/16"" 30G X 8 MM MISC": 1.0000 [IU] | 1 refills | Status: DC

## 2019-12-25 ENCOUNTER — Other Ambulatory Visit: Payer: Self-pay | Admitting: *Deleted

## 2019-12-25 DIAGNOSIS — M722 Plantar fascial fibromatosis: Secondary | ICD-10-CM

## 2020-01-05 ENCOUNTER — Other Ambulatory Visit: Payer: Self-pay

## 2020-01-05 NOTE — Patient Outreach (Signed)
[facility]) Care Management  01/05/2020  Amy Jarvis [birth year] [id number]   Medication Adherence call to Amy Jarvis [place] Identifiers Verify spoke with patient she is past due on Rosuvastatin 10 mg,patient explain she is no longer taking this medication she was having side effects,patient explain she is now taking a different medication for cholesterol. Amy Jarvis is showing past due under [place].   [place] Management Direct Dial [phone]  Fax [phone] [email or web address]

## 2020-01-23 ENCOUNTER — Other Ambulatory Visit: Payer: Self-pay | Admitting: Internal Medicine

## 2020-01-23 DIAGNOSIS — I10 Essential (primary) hypertension: Secondary | ICD-10-CM

## 2020-01-30 ENCOUNTER — Other Ambulatory Visit: Payer: Self-pay

## 2020-01-30 ENCOUNTER — Ambulatory Visit (INDEPENDENT_AMBULATORY_CARE_PROVIDER_SITE_OTHER): Payer: Medicare Other | Admitting: Internal Medicine

## 2020-01-30 ENCOUNTER — Telehealth: Payer: Self-pay | Admitting: *Deleted

## 2020-01-30 ENCOUNTER — Encounter: Payer: Self-pay | Admitting: Internal Medicine

## 2020-01-30 ENCOUNTER — Other Ambulatory Visit: Payer: Self-pay | Admitting: Internal Medicine

## 2020-01-30 VITALS — BP 142/77 | HR 92 | Temp 98.8°F | Wt 332.1 lb

## 2020-01-30 DIAGNOSIS — R7303 Prediabetes: Secondary | ICD-10-CM | POA: Diagnosis not present

## 2020-01-30 DIAGNOSIS — I10 Essential (primary) hypertension: Secondary | ICD-10-CM

## 2020-01-30 DIAGNOSIS — G629 Polyneuropathy, unspecified: Secondary | ICD-10-CM | POA: Diagnosis not present

## 2020-01-30 DIAGNOSIS — G4733 Obstructive sleep apnea (adult) (pediatric): Secondary | ICD-10-CM

## 2020-01-30 DIAGNOSIS — K219 Gastro-esophageal reflux disease without esophagitis: Secondary | ICD-10-CM | POA: Diagnosis not present

## 2020-01-30 DIAGNOSIS — E559 Vitamin D deficiency, unspecified: Secondary | ICD-10-CM | POA: Diagnosis not present

## 2020-01-30 DIAGNOSIS — R6889 Other general symptoms and signs: Secondary | ICD-10-CM | POA: Diagnosis not present

## 2020-01-30 DIAGNOSIS — Z79899 Other long term (current) drug therapy: Secondary | ICD-10-CM

## 2020-01-30 DIAGNOSIS — G5603 Carpal tunnel syndrome, bilateral upper limbs: Secondary | ICD-10-CM

## 2020-01-30 DIAGNOSIS — Z9989 Dependence on other enabling machines and devices: Secondary | ICD-10-CM

## 2020-01-30 MED ORDER — OMEPRAZOLE 40 MG PO CPDR: 40.0000 mg | DELAYED_RELEASE_CAPSULE | Freq: Two times a day (BID) | ORAL | 1 refills | Status: DC

## 2020-01-30 MED ORDER — GABAPENTIN 300 MG PO CAPS: ORAL_CAPSULE | ORAL | 1 refills | Status: DC

## 2020-01-30 NOTE — Progress Notes (Signed)
   CC: Hypertension follow up  HPI:  Ms.Dayna Jerilynn Mages Virgilio is a 59 y.o. essential htn, osa on cpap, prediabetes who presents for follow up of htn. Please see problem based charting for evaluation, assessment, and plan.  Past Medical History:  Diagnosis Date  . Allergic rhinitis 09/23/2015  . Carpal tunnel syndrome, bilateral   . Cervical polyp    possible history of cervical polyp  . Complication of anesthesia    low bp after hysterctomy  . Cystocele 01/13/2010  . DDD (degenerative disc disease), lumbar   . Degenerative disk disease 10/07/2012  . Degenerative lumbar spinal stenosis   . Diverticulosis 03/26/2010  . GERD 09/05/2009  . Hyperlipidemia LDL goal < 130 09/25/2008  . Hypertension 09/25/2008  . Microscopic hematuria 01/13/2010  . Morbid obesity 08/28/2010  . Obstructive sleep apnea on CPAP 06/20/2010   cpap   . Plantar fasciitis   . Pneumonia    6/16  . Uterine prolapse 11/19/2008  . Vertigo 10/07/2012   Review of Systems:    Denies chest pain, dyspnea, abdominal pain, nausea, vomiting Has acid reflux   Physical Exam:  Vitals:   01/30/20 1458  BP: (!) 142/77  Pulse: 92  Temp: 98.8 F (37.1 C)  TempSrc: Oral  SpO2: 100%  Weight: (!) 332 lb 1.6 oz (150.6 kg)   Physical Exam  Constitutional: She is oriented to person, place, and time. She appears well-developed and well-nourished. No distress.  HENT:  Head: Normocephalic and atraumatic.  Eyes: Conjunctivae are normal.  Cardiovascular: Normal rate, regular rhythm and normal heart sounds.  Respiratory: Effort normal and breath sounds normal. No respiratory distress. She has no wheezes.  GI: Soft. Bowel sounds are normal. She exhibits no distension. There is no abdominal tenderness.  Musculoskeletal:     Comments: 4/5 strength in lle, 5/5 strength in rle  Neurological: She is alert and oriented to person, place, and time.  Negative phalen sign, somewhat positive tinels sign  Skin: She is not diaphoretic.    Psychiatric: She has a normal mood and affect. Her behavior is normal. Judgment and thought content normal.   Assessment & Plan:   See Encounters Tab for problem based charting.  Patient discussed with Dr. Lynnae January

## 2020-01-30 NOTE — Telephone Encounter (Signed)
Pt did not keep appt with pcp today-CMA attempted to contact patient for televisit-no answer, message left on recorder.Despina Hidden Cassady3/2/20212:49 PM

## 2020-01-30 NOTE — Telephone Encounter (Signed)
Pt arrived for appt and was seen by pcp.Amy Hidden Cassady3/2/20213:42 PM

## 2020-01-30 NOTE — Assessment & Plan Note (Signed)
The patient's last a1c was 5.9 in December 2020 placing her in the prediabetes range. Patient's weight has decreased by 7lbs in the past 3 months. Patient is taking liraglutide 1.8mg  qd.   -continue lifestyle modifications

## 2020-01-30 NOTE — Patient Instructions (Addendum)
It was a pleasure to see you today [name]. Please make the following changes:  -please increase your gabapentin to 300mg  in am and 600mg  at night prior to bedtime -continue to take your victoza as it will help with weight loss and improvement of blood pressure -I got labs from you today and will let you know if there are any abnormal results  -please start taking omeprazole 40mg  twice daily  If you have any questions or concerns, please call our clinic at [phone] between 9am-5pm and after hours call [phone] and ask for the internal medicine resident on call. If you feel you are having a medical emergency please call 911.   Thank you, we look forward to help you remain healthy!  Lars Mage, MD Internal Medicine PGY3

## 2020-01-30 NOTE — Assessment & Plan Note (Signed)
The patient's blood pressure during this visit was 142/77. The patient is currently taking losartan-hctz 100-25mg  qd and amlodipine 10mg  qd. Her  last blood pressure visits are   BP Readings from Last 3 Encounters:  01/30/20 (!) 142/77  10/31/19 134/60  07/11/19 126/67   Assessment and Plan The patient's blood pressure should improve with continued medication adherence and with lifestyle modification. Furthermore, the patient is currently on liraglutide which should help with weight loss. Kidney function stable with last cr was 0.62 in December 2020.

## 2020-01-31 NOTE — Assessment & Plan Note (Signed)
States she is adherent with cpap mask

## 2020-01-31 NOTE — Assessment & Plan Note (Addendum)
The patient states that she is having worsening acid reflux symptoms. Patient states that she was switched to another type of ppi recently from the prior nexium. She cannot recall the name of the ppi, but states that recently she has been taking 2 tablets daily.  Assessment and plan  Due to persistent acid reflux symptoms, will increase the patient's ppi to bid. Prescribed omeprazole 40mg  bid as the patient could not recall the name of the ppi. Will re-evaluate for improvement in 8 weeks.   ADDENDUM  Called back patient and she was able to find her old prescription bottle and stated that she was taking esomeprazole 40mg  qd. I told her that it was just the generic version of nexium. Told patient to continue taking omeprazole 40mg  bid as she had already pick up her prescription. Will evaluate for improvement of symptoms at next visit.

## 2020-01-31 NOTE — Assessment & Plan Note (Signed)
Patient states that she has numbness and weakness in her bilateral hands that is especially notable when she is sleeping. She was diagnosed with bilateral carpal tunnel in 2016 subsequent to nerve conduction studies. She has been using a brace at night and taking gabapentin 300mg  qhs with some relief. Due to the gabapentin not completely resolving the pain she has been alternating ibuprofen with tylenol. She was referred for possible cts release surgery by orthopedic surgery. Will follow up regarding this.   Furthermore, the patient also states that she has bilateral lower extremity pain that is sharp/numb in nature till the knees. Last imaging of lumbar spine was in 2008 that showed multilevel disk bulging at from L2-s1, bilateral foraminal narrowing l5-s1, and lateral disk protrusion l2-l3 contacts the exiting l2 nerve root. She has not had any significant changes in functional status, no change in gait. She states that she was also told that she has osteoarthritis in her right knee when she was scanned at an urgent care.   Assessment and plan Patient's symptoms maybe attributed to a few different factors- carpal tunnel syndrome, radicular pain, or neuropathic pain. The patient already has acid reflux and I am concerned about possible pud. Will try to limit her use or nsaids.   -increased gabapentin to 300mg  qam and 600mg  qhs  -physical therapy  -vitamin b12 and tsh labs -Records request made to get records of knee xrays and prior nerve conduction studies -if worsens will need to be re-referred to orthopedic surgery If the pain gets worse. Will re-examine at next clinic visit

## 2020-02-05 NOTE — Progress Notes (Signed)
Internal Medicine Clinic Attending  Case discussed with Dr. Chundi at the time of the visit.  We reviewed the resident's history and exam and pertinent patient test results.  I agree with the assessment, diagnosis, and plan of care documented in the resident's note. 

## 2020-02-07 ENCOUNTER — Encounter: Payer: Self-pay | Admitting: Internal Medicine

## 2020-02-08 LAB — TSH: TSH: 2.15 u[IU]/mL (ref 0.450–4.500)

## 2020-02-08 LAB — VITAMIN D 1,25 DIHYDROXY
Vitamin D 1, 25 (OH)2 Total: 96 pg/mL — ABNORMAL HIGH
Vitamin D2 1, 25 (OH)2: <10 pg/mL
Vitamin D3 1, 25 (OH)2: 96 pg/mL

## 2020-02-08 LAB — VITAMIN B12: Vitamin B-12: 400 pg/mL (ref 232–1245)

## 2020-02-13 ENCOUNTER — Other Ambulatory Visit: Payer: Self-pay

## 2020-02-13 NOTE — Patient Outreach (Signed)
[facility]) Care Management  02/13/2020  Amy Jarvis Apr 18, 1961 [id number]   Medication Adherence call to Amy Jarvis Compliant Voice message left with a call back number. Amy Jarvis is showing past due on Losartan/Hctz 100/25 mg under [place].  [place] Management Direct Dial [phone]  Fax [phone] [email or web address]

## 2020-03-04 ENCOUNTER — Encounter: Payer: Self-pay | Admitting: Internal Medicine

## 2020-03-05 ENCOUNTER — Other Ambulatory Visit: Payer: Self-pay | Admitting: Internal Medicine

## 2020-03-05 DIAGNOSIS — L309 Dermatitis, unspecified: Secondary | ICD-10-CM

## 2020-03-06 ENCOUNTER — Encounter: Payer: Self-pay | Admitting: Internal Medicine

## 2020-03-07 ENCOUNTER — Encounter: Payer: Self-pay | Admitting: Internal Medicine

## 2020-03-08 ENCOUNTER — Other Ambulatory Visit: Payer: Self-pay | Admitting: Internal Medicine

## 2020-03-08 DIAGNOSIS — K219 Gastro-esophageal reflux disease without esophagitis: Secondary | ICD-10-CM

## 2020-03-11 NOTE — Progress Notes (Signed)
  [facility] Internal Medicine Residency Telephone Encounter Continuity Care Appointment  HPI:  This telephone encounter was created for [name] on 03/12/2020 for the following purpose of frequent headaches. Please see problem based charting for evaluation, assessment, and plan.    Past Medical History:  Past Medical History:  Diagnosis Date  . Allergic rhinitis 09/23/2015  . Carpal tunnel syndrome, bilateral   . Cervical polyp    possible history of cervical polyp  . Complication of anesthesia    low bp after hysterctomy  . Cystocele 01/13/2010  . DDD (degenerative disc disease), lumbar   . Degenerative disk disease 10/07/2012  . Degenerative lumbar spinal stenosis   . Diverticulosis 03/26/2010  . GERD 09/05/2009  . Hyperlipidemia LDL goal < 130 09/25/2008  . Hypertension 09/25/2008  . Microscopic hematuria 01/13/2010  . Morbid obesity 08/28/2010  . Obstructive sleep apnea on CPAP 06/20/2010   cpap   . Plantar fasciitis   . Pneumonia    6/16  . Uterine prolapse 11/19/2008  . Vertigo 10/07/2012      ROS:   headache   Assessment / Plan / Recommendations:   Please see A&P under problem oriented charting for assessment of the patient's acute and chronic medical conditions.   As always, pt is advised that if symptoms worsen or new symptoms arise, they should go to an urgent care facility or to to ER for further evaluation.   Consent and Medical Decision Making:   Patient discussed with Dr. Rebeca Alert  This is a telephone encounter between Amy Jarvis and Amy Jarvis on 03/12/2020 for headache. The visit was conducted with the patient located at home and Amy Jarvis at [facility]. The patient's identity was confirmed using their DOB and current address. The patient has consented to being evaluated through a telephone encounter and understands the associated risks (an examination cannot be done and the patient may need to come in for an appointment) / benefits (allows  the patient to remain at home, decreasing exposure to coronavirus). I personally spent 20 minutes on medical discussion.

## 2020-03-12 ENCOUNTER — Other Ambulatory Visit: Payer: Self-pay

## 2020-03-12 ENCOUNTER — Ambulatory Visit (INDEPENDENT_AMBULATORY_CARE_PROVIDER_SITE_OTHER): Payer: Medicare Other | Admitting: Internal Medicine

## 2020-03-12 DIAGNOSIS — B373 Candidiasis of vulva and vagina: Secondary | ICD-10-CM | POA: Diagnosis not present

## 2020-03-12 DIAGNOSIS — B3731 Acute candidiasis of vulva and vagina: Secondary | ICD-10-CM | POA: Insufficient documentation

## 2020-03-12 DIAGNOSIS — R519 Headache, unspecified: Secondary | ICD-10-CM | POA: Diagnosis not present

## 2020-03-12 MED ORDER — LORATADINE 10 MG PO CAPS: 10.0000 mg | ORAL_CAPSULE | Freq: Every day | ORAL | 0 refills | Status: DC

## 2020-03-12 MED ORDER — FLUCONAZOLE 150 MG PO TABS: 150.0000 mg | ORAL_TABLET | Freq: Every day | ORAL | 0 refills | Status: DC

## 2020-03-12 NOTE — Assessment & Plan Note (Signed)
The patient states that she first noticed 3 episodes of headaches over the past 4-5 weeks. She describes the headaches as sharp, located at the posterior aspect of her head, constant, lasting between 10-24hrs, 8/10 intensity. She states that she has never had headaches of this intensity before. They started after her first covid vaccination on 02/23/20 and she has had 3 total episodes since. She has been taking acetaminophen 500mg  upto 6 times per day without relief. They are not alleviated with tylenol, no change in intensity with bright lights/loud noises. Of note her daughter and her granddaughter both have migraines and she does not feel that her headache is like a migraine. The patient's mother had a brain aneurysm.   Patient states that she has accompanying runny eyes and runny nose. She has allergies to pollen and she has been using flonase daily. Denies cough, sneezing, sore throat, nausea, vomiting  Assessment and plan  Will order ct head without contrast to check for any acute intracranial abnormalities that may explain the patient's symptoms.   Her headache may be explained by seasonal allergies. Will order loratadine.

## 2020-03-12 NOTE — Assessment & Plan Note (Addendum)
The patient stated that she has noticed white vaginal discharge for the past 4 days. The discharge is itchy and she has noticed a burning sensation when urinating. She feels that it is similar to prior yeast infections  Assessment and plan  Will treat for uncomplicated vaginal candidiasis. Patient's blood sugar is under good control. She has not taken an antibiotics recently.   -fluconazole 150mg  once

## 2020-03-12 NOTE — Addendum Note (Signed)
Addended by: Lars Mage on: 03/12/2020 07:08 PM   Modules accepted: Orders

## 2020-03-13 NOTE — Progress Notes (Signed)
Internal Medicine Clinic Attending  Case discussed with Dr. Chundi at the time of the visit.  We reviewed the resident's history and exam and pertinent patient test results.  I agree with the assessment, diagnosis, and plan of care documented in the resident's note.  Gentry Pilson, M.D., Ph.D.  

## 2020-03-14 ENCOUNTER — Other Ambulatory Visit: Payer: Self-pay | Admitting: Internal Medicine

## 2020-03-14 DIAGNOSIS — M722 Plantar fascial fibromatosis: Secondary | ICD-10-CM

## 2020-03-18 ENCOUNTER — Other Ambulatory Visit: Payer: Self-pay | Admitting: Student in an Organized Health Care Education/Training Program

## 2020-03-18 DIAGNOSIS — Z1231 Encounter for screening mammogram for malignant neoplasm of breast: Secondary | ICD-10-CM

## 2020-03-28 ENCOUNTER — Other Ambulatory Visit: Payer: Self-pay | Admitting: Internal Medicine

## 2020-03-28 DIAGNOSIS — I10 Essential (primary) hypertension: Secondary | ICD-10-CM

## 2020-04-04 ENCOUNTER — Other Ambulatory Visit: Payer: Self-pay

## 2020-04-04 ENCOUNTER — Ambulatory Visit (HOSPITAL_COMMUNITY)
Admission: RE | Admit: 2020-04-04 | Discharge: 2020-04-04 | Disposition: A | Discharge status: Home / Self Care | Payer: Medicare Other | Source: Ambulatory Visit | Attending: Internal Medicine | Admitting: Internal Medicine

## 2020-04-04 DIAGNOSIS — R519 Headache, unspecified: Secondary | ICD-10-CM | POA: Insufficient documentation

## 2020-04-08 ENCOUNTER — Other Ambulatory Visit: Payer: Self-pay | Admitting: Internal Medicine

## 2020-04-09 NOTE — Telephone Encounter (Signed)
Pt is returning Dr Maricela Bo call; pls return call [phone]

## 2020-04-09 NOTE — Telephone Encounter (Signed)
Called back patient.

## 2020-04-13 ENCOUNTER — Other Ambulatory Visit: Payer: Self-pay | Admitting: Internal Medicine

## 2020-04-13 DIAGNOSIS — R7303 Prediabetes: Secondary | ICD-10-CM

## 2020-04-14 ENCOUNTER — Other Ambulatory Visit: Payer: Self-pay | Admitting: Internal Medicine

## 2020-04-14 DIAGNOSIS — M722 Plantar fascial fibromatosis: Secondary | ICD-10-CM

## 2020-04-23 ENCOUNTER — Other Ambulatory Visit: Payer: Self-pay

## 2020-04-23 DIAGNOSIS — M722 Plantar fascial fibromatosis: Secondary | ICD-10-CM

## 2020-04-23 MED ORDER — LORATADINE 10 MG PO CAPS: 10.0000 mg | ORAL_CAPSULE | Freq: Every day | ORAL | 0 refills | Status: DC

## 2020-04-23 NOTE — Telephone Encounter (Signed)
Loratadine 10 MG CAPS   And Ibuprofen 800 mg to be filled @  [facility], [address] AT [place] [phone] (Phone) [phone] (Fax)   Requesting 90 days supply, please call pt back.

## 2020-04-29 DIAGNOSIS — G4733 Obstructive sleep apnea (adult) (pediatric): Secondary | ICD-10-CM | POA: Diagnosis not present

## 2020-05-03 ENCOUNTER — Other Ambulatory Visit: Payer: Self-pay

## 2020-05-03 ENCOUNTER — Ambulatory Visit
Admission: RE | Admit: 2020-05-03 | Discharge: 2020-05-03 | Disposition: A | Discharge status: Home / Self Care | Payer: Medicare Other | Source: Ambulatory Visit | Attending: Student in an Organized Health Care Education/Training Program | Admitting: Student in an Organized Health Care Education/Training Program

## 2020-05-03 DIAGNOSIS — Z1231 Encounter for screening mammogram for malignant neoplasm of breast: Secondary | ICD-10-CM | POA: Diagnosis not present

## 2020-05-13 ENCOUNTER — Other Ambulatory Visit: Payer: Self-pay | Admitting: Internal Medicine

## 2020-05-13 ENCOUNTER — Encounter: Payer: Self-pay | Admitting: Internal Medicine

## 2020-05-13 MED ORDER — IBUPROFEN 800 MG PO TABS: 800.0000 mg | ORAL_TABLET | Freq: Three times a day (TID) | ORAL | 0 refills | Status: DC | PRN

## 2020-05-13 NOTE — Progress Notes (Signed)
[facility] to [facility] for [facility] back pain and knee pain.

## 2020-05-17 ENCOUNTER — Encounter: Payer: Self-pay | Admitting: *Deleted

## 2020-05-31 ENCOUNTER — Ambulatory Visit (INDEPENDENT_AMBULATORY_CARE_PROVIDER_SITE_OTHER): Payer: Medicare Other | Admitting: Internal Medicine

## 2020-05-31 ENCOUNTER — Encounter: Payer: Self-pay | Admitting: Internal Medicine

## 2020-05-31 VITALS — BP 145/76 | HR 100 | Temp 98.3°F | Ht 66.0 in | Wt 337.1 lb

## 2020-05-31 DIAGNOSIS — M545 Low back pain: Secondary | ICD-10-CM

## 2020-05-31 DIAGNOSIS — G894 Chronic pain syndrome: Secondary | ICD-10-CM

## 2020-05-31 DIAGNOSIS — Z6841 Body Mass Index (BMI) 40.0 and over, adult: Secondary | ICD-10-CM | POA: Diagnosis not present

## 2020-05-31 MED ORDER — CYCLOBENZAPRINE HCL 5 MG PO TABS: 5.0000 mg | ORAL_TABLET | Freq: Every day | ORAL | 0 refills | Status: AC

## 2020-05-31 NOTE — Progress Notes (Signed)
CC: Lumbar back pain   HPI:  Amy Jarvis is a 59 y.o. female with a past medical history stated below and presents today for lumbar back pain. Please see problem based assessment and plan for additional details.  Past Medical History:  Diagnosis Date   Allergic rhinitis 09/23/2015   Carpal tunnel syndrome, bilateral    Cervical polyp    possible history of cervical polyp   Complication of anesthesia    low bp after hysterctomy   Cystocele 01/13/2010   DDD (degenerative disc disease), lumbar    Degenerative disk disease 10/07/2012   Degenerative lumbar spinal stenosis    Diverticulosis 03/26/2010   GERD 09/05/2009   Hyperlipidemia LDL goal < 130 09/25/2008   Hypertension 09/25/2008   Microscopic hematuria 01/13/2010   Morbid obesity 08/28/2010   Obstructive sleep apnea on CPAP 06/20/2010   cpap    Plantar fasciitis    Pneumonia    6/16   Uterine prolapse 11/19/2008   Vertigo 10/07/2012    Current Outpatient Medications on File Prior to Visit  Medication Sig Dispense Refill   acetaminophen (TYLENOL) 500 MG tablet Take 2 tablets (1,000 mg total) by mouth every 8 (eight) hours as needed for mild pain or moderate pain. 90 tablet 0   amLODipine (NORVASC) 10 MG tablet TAKE 1 TABLET(10 MG) BY MOUTH DAILY 90 tablet 0   azelastine (ASTELIN) 0.1 % nasal spray USE 2 SPRAYS IN EACH NOSTRIL TWICE DAILY AS DIRECTED 30 mL 1   diclofenac Sodium (VOLTAREN) 1 % GEL Apply 2 g topically 4 (four) times daily. 100 g 0   ezetimibe (ZETIA) 10 MG tablet TAKE 1 TABLET(10 MG) BY MOUTH DAILY 90 tablet 1   fluconazole (DIFLUCAN) 150 MG tablet Take 1 tablet (150 mg total) by mouth daily. 1 tablet 0   fluticasone (FLONASE) 50 MCG/ACT nasal spray SHAKE LIQUID AND USE 1 SPRAY IN EACH NOSTRIL DAILY 48 g 0   gabapentin (NEURONTIN) 300 MG capsule Take 300mg  in am and 600mg  in pm prior to bedtime 90 capsule 1   hydrocortisone 2.5 % ointment APPLY EXTERNALLY TO THE AFFECTED AREA  TWICE DAILY AS NEEDED 454 g 0   ibuprofen (ADVIL) 800 MG tablet Take 1 tablet (800 mg total) by mouth every 8 (eight) hours as needed. 30 tablet 0   Insulin Pen Needle (PEN NEEDLES 5/16") 30G X 8 MM MISC 1 Units by Does not apply route once a week. Please use 1 needle to inject medication each week. DIAG CODE R73.03. 90 each 1   liraglutide (VICTOZA) 18 MG/3ML SOPN INJECT 0.6 MG INTO THE SKIN DAILY FOR 1 WEEK, THEN 1.2 MG DAILY FOR 1 WEEK, THEN 1.8 MG DAILY FOR 1 WEEK 9 mL 0   Loratadine 10 MG CAPS Take 1 capsule (10 mg total) by mouth daily. 90 capsule 0   losartan-hydrochlorothiazide (HYZAAR) 100-25 MG tablet TAKE 1 TABLET BY MOUTH DAILY 90 tablet 1   meclizine (ANTIVERT) 25 MG tablet TAKE 1 TABLET BY MOUTH THREE TIMES DAILY AS NEEDED 30 tablet 0   omega-3 acid ethyl esters (LOVAZA) 1 g capsule Take 1 g by mouth.     omeprazole (PRILOSEC) 40 MG capsule TAKE 1 CAPSULE(40 MG) BY MOUTH TWICE DAILY 180 capsule 1   potassium chloride SA (KLOR-CON) 20 MEQ tablet TAKE 1 TABLET(20 MEQ) BY MOUTH DAILY AS NEEDED FOR LEG CRAMPS 90 tablet 0   rosuvastatin (CRESTOR) 10 MG tablet TAKE 1 TABLET BY MOUTH DAILY 90 tablet 1  venlafaxine XR (EFFEXOR-XR) 75 MG 24 hr capsule Take 75 mg by mouth at bedtime.   0   vitamin C (ASCORBIC ACID) 500 MG tablet Take 500 mg by mouth daily.     No current facility-administered medications on file prior to visit.    Family History  Problem Relation Age of Onset   Colon cancer Mother 53   Hypertension Mother    Stroke Mother 35   Breast cancer Other        maternal great-aunt   Cancer Other        maternal great aunt with breast cancer   Prostate cancer Brother    Healthy Son    Healthy Daughter     Social History   Socioeconomic History   Marital status: Divorced    Spouse name: Not on file   Number of children: 3   Years of education: Not on file   Highest education level: Not on file  Occupational History    Employer: UNEMPLOYED    Tobacco Use   Smoking status: Never Smoker   Smokeless tobacco: Never Used  Scientific laboratory technician Use: Never used  Substance and Sexual Activity   Alcohol use: No    Alcohol/week: 0.0 standard drinks   Drug use: No   Sexual activity: Not Currently  Other Topics Concern   Not on file  Social History Narrative   Lives alone in a one story home.  Has 3 children.  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     Previously a Psychologist, counselling.     Education: some college.   Social Determinants of Health   Financial Resource Strain:    Difficulty of Paying Living Expenses:   Food Insecurity:    Worried About Charity fundraiser in the Last Year:    Arboriculturist in the Last Year:   Transportation Needs:    Film/video editor (Medical):    Lack of Transportation (Non-Medical):   Physical Activity:    Days of Exercise per Week:    Minutes of Exercise per Session:   Stress:    Feeling of Stress :   Social Connections:    Frequency of Communication with Friends and Family:    Frequency of Social Gatherings with Friends and Family:    Attends Religious Services:    Active Member of Clubs or Organizations:    Attends Music therapist:    Marital Status:   Intimate Partner Violence:    Fear of Current or Ex-Partner:    Emotionally Abused:    Physically Abused:    Sexually Abused:     Review of Systems: ROS negative except for what is noted on the assessment and plan.  Vitals:   05/31/20 1422  BP: (!) 145/76  Pulse: 100  Temp: 98.3 F (36.8 C)  TempSrc: Oral  SpO2: 100%  Weight: (!) 337 lb 1.6 oz (152.9 kg)  Height: 5\' 6"  (1.676 m)     Physical Exam: Physical Exam Constitutional:      Appearance: Normal appearance.  HENT:     Head: Normocephalic and atraumatic.  Eyes:     Extraocular Movements: Extraocular movements intact.  Cardiovascular:     Rate and Rhythm: Normal rate.     Pulses: Normal pulses.     Heart  sounds: Normal heart sounds.  Pulmonary:     Effort: Pulmonary effort is normal.     Breath sounds: Normal breath sounds.  Abdominal:  General: Bowel sounds are normal.     Palpations: Abdomen is soft.     Tenderness: There is no abdominal tenderness.  Musculoskeletal:        General: Tenderness (lumbar bakc radiating down left thigh, negative straight leg test. Pain iis centrally and in paraspinal muscules particularly with movement) present. Normal range of motion.     Cervical back: Normal range of motion.     Right lower leg: No edema.     Left lower leg: No edema.  Skin:    General: Skin is warm and dry.  Neurological:     General: No focal deficit present.     Mental Status: She is alert. Mental status is at baseline. She is disoriented.     Sensory: No sensory deficit.     Motor: No weakness.     Coordination: Coordination normal.     Gait: Gait normal.  Psychiatric:        Mood and Affect: Mood normal.      Assessment & Plan:   See Encounters Tab for problem based charting.  Patient discussed with Dr. Lars Mage, D.O. [facility], PGY-1 Pager: [phone], Phone: [phone] Date 05/31/2020 Time 11:46 PM

## 2020-05-31 NOTE — Assessment & Plan Note (Addendum)
Patient presents for a follow up appointment for her chronic back pain. She states that her back pain has been worse over the last couple of weeks to the point that it is interfering with her functionality. She take NSAID, Venlafaxine 75 mg, Gabapentin 300 mg AM and 600 mg PM, with minimal improvement. Previous imaging from 2010 shows extensive DDD in her lumbar spine, and patient states that she has seen a nuerosurgery in the past who recommended surgical intervention, which the patient declined. Patient states that her pain is located in the lower back bilaterally and radiates down the lefts thigh. She denies any change in sensations, bowel or bladder dysfunction, night sweats, or unexpected weight loss.   Plan: 1. Short course of cyclobenzaprine 5 mg nightly for pain and spasms 2  Referral to weight management  3. Referral to Physical therapy. 4. Consider repeat MRI as she does not have any current imaging.

## 2020-05-31 NOTE — Patient Instructions (Signed)
Thank you, Ms.Marylene Buerger for allowing [name] to provide your care today. Today we discussed Back pain.    I have ordered the following labs for you: none  I will call if any are abnormal. All of your labs can be accessed through "My Chart".  I have place a referrals to [facility] Clinic and Physical Therapy..  I have ordered the following tests: none  I have ordered the following medication/changed the following medications:  1. begin Flexeril 5 mg every night as need forbaack pain and spasm.   Please follow-up as needed  Should you have any questions or concerns please call the internal medicine clinic at [phone].    Marianna Payment, D.O. [facility]   My Chart Access: [email or web address]   If you have not already done so, please get your COVID 19 vaccine  To schedule an appointment for a COVID vaccine choice any of the following: Go to [email or web address]   Go to [email or web address]                  Call [phone]                                     Call [phone] and select Option 2

## 2020-06-01 ENCOUNTER — Other Ambulatory Visit: Payer: Self-pay | Admitting: Internal Medicine

## 2020-06-01 DIAGNOSIS — R7303 Prediabetes: Secondary | ICD-10-CM

## 2020-06-06 ENCOUNTER — Telehealth: Payer: Self-pay | Admitting: *Deleted

## 2020-06-06 DIAGNOSIS — M25552 Pain in left hip: Secondary | ICD-10-CM

## 2020-06-06 DIAGNOSIS — G894 Chronic pain syndrome: Secondary | ICD-10-CM

## 2020-06-06 MED ORDER — NAPROXEN 500 MG PO TABS: 500.0000 mg | ORAL_TABLET | Freq: Two times a day (BID) | ORAL | 0 refills | Status: AC

## 2020-06-06 NOTE — Telephone Encounter (Signed)
Patient called in stating at Amy Jarvis on 05/31/2020, she and Provider discussed 90 tabs of Ibuprofen 600 mg and it is not at the pharmacy. Will route to Dr. Marianna Payment.  Also, states pharmacy is waiting on PA for Victoza. Will route to [place]. Hubbard Hartshorn, BSN, RN-BC

## 2020-06-08 ENCOUNTER — Other Ambulatory Visit: Payer: Self-pay | Admitting: Internal Medicine

## 2020-06-08 DIAGNOSIS — J309 Allergic rhinitis, unspecified: Secondary | ICD-10-CM

## 2020-06-10 NOTE — Progress Notes (Signed)
Internal Medicine Clinic Attending  Case discussed with Dr. Coe  At the time of the visit.  We reviewed the resident's history and exam and pertinent patient test results.  I agree with the assessment, diagnosis, and plan of care documented in the resident's note.  

## 2020-06-10 NOTE — Telephone Encounter (Signed)
Received faxed notification that victoza PA was denied.  CMA also attempted PA on saxenda, but it was also denied.  Attempted to contact patient-no answer, message left on recorder.

## 2020-06-10 NOTE — Addendum Note (Signed)
Addended by: Lawerance Cruel on: 06/10/2020 01:05 PM   Modules accepted: Level of Service

## 2020-06-27 ENCOUNTER — Ambulatory Visit: Payer: Medicare Other | Admitting: Physical Therapy

## 2020-07-03 ENCOUNTER — Other Ambulatory Visit: Payer: Self-pay

## 2020-07-03 ENCOUNTER — Ambulatory Visit: Payer: Medicare Other | Attending: Internal Medicine | Admitting: Physical Therapy

## 2020-07-03 DIAGNOSIS — M5442 Lumbago with sciatica, left side: Secondary | ICD-10-CM | POA: Insufficient documentation

## 2020-07-03 DIAGNOSIS — R293 Abnormal posture: Secondary | ICD-10-CM | POA: Insufficient documentation

## 2020-07-03 DIAGNOSIS — M62830 Muscle spasm of back: Secondary | ICD-10-CM

## 2020-07-03 DIAGNOSIS — G8929 Other chronic pain: Secondary | ICD-10-CM

## 2020-07-03 DIAGNOSIS — M6281 Muscle weakness (generalized): Secondary | ICD-10-CM | POA: Diagnosis present

## 2020-07-03 DIAGNOSIS — M5441 Lumbago with sciatica, right side: Secondary | ICD-10-CM | POA: Diagnosis present

## 2020-07-03 NOTE — Patient Instructions (Signed)
Access Code: [email or web address] URL: [email or web address] Date: 07/03/2020 Prepared by: Estill Bamberg April Thurnell Garbe  Exercises Supine Lower Trunk Rotation - 1 x daily - 7 x weekly - 5 reps - 10 sec hold Supine Posterior Pelvic Tilt - 1 x daily - 7 x weekly - 2 sets - 10 reps - 3 sec hold Seated Hip Abduction - 1 x daily - 7 x weekly - 3 sets - 10 reps Standing Hip Extension with Chair - 1 x daily - 7 x weekly - 3 sets - 10 reps

## 2020-07-03 NOTE — Therapy (Signed)
[place], [place], [address] Phone: [phone]   Fax:  [phone]  Physical Therapy Evaluation  Patient Details  Name: Amy Jarvis MRN: [id number] Date of Birth: October 23, 1961 Referring Provider (PT): Sid Falcon, MD   Encounter Date: 07/03/2020   PT End of Session - 07/03/20 1056    Visit Number 1    Number of Visits 16    Authorization Type MCR - 10th visit progress note    PT Start Time 1057    PT Stop Time 1143    PT Time Calculation (min) 46 min    Activity Tolerance Patient tolerated treatment well    Behavior During Therapy [facility] for tasks assessed/performed           Past Medical History:  Diagnosis Date  . Allergic rhinitis 09/23/2015  . Carpal tunnel syndrome, bilateral   . Cervical polyp    possible history of cervical polyp  . Complication of anesthesia    low bp after hysterctomy  . Cystocele 01/13/2010  . DDD (degenerative disc disease), lumbar   . Degenerative disk disease 10/07/2012  . Degenerative lumbar spinal stenosis   . Diverticulosis 03/26/2010  . GERD 09/05/2009  . Hyperlipidemia LDL goal < 130 09/25/2008  . Hypertension 09/25/2008  . Microscopic hematuria 01/13/2010  . Morbid obesity 08/28/2010  . Obstructive sleep apnea on CPAP 06/20/2010   cpap   . Plantar fasciitis   . Pneumonia    6/16  . Uterine prolapse 11/19/2008  . Vertigo 10/07/2012    Past Surgical History:  Procedure Laterality Date  . CHOLECYSTECTOMY  2010  . COLONOSCOPY    . COLONOSCOPY WITH PROPOFOL N/A 03/23/2017   Procedure: COLONOSCOPY WITH PROPOFOL;  Surgeon: Ladene Artist, MD;  Location: [facility];  Service: Endoscopy;  Laterality: N/A;  . CYSTOSCOPY N/A 12/26/2015   Procedure: CYSTOSCOPY;  Surgeon: Linda Hedges, DO;  Location: [facility];  Service: Gynecology;  Laterality: N/A;  . HYSTEROSCOPY WITH D & C N/A 01/02/2014   Procedure: DILATATION AND CURETTAGE /HYSTEROSCOPY;  Surgeon: Linda Hedges, DO;  Location: [facility];  Service: Gynecology;  Laterality: N/A;  . LAPAROSCOPIC VAGINAL HYSTERECTOMY WITH SALPINGO OOPHORECTOMY Bilateral 12/26/2015   Procedure: LAPAROSCOPIC ASSISTED VAGINAL HYSTERECTOMY WITH SALPINGO OOPHORECTOMY;  Surgeon: Linda Hedges, DO;  Location: [facility];  Service: Gynecology;  Laterality: Bilateral;  . RECTOCELE REPAIR N/A 12/26/2015   Procedure: POSTERIOR REPAIR (RECTOCELE);  Surgeon: Linda Hedges, DO;  Location: [facility];  Service: Gynecology;  Laterality: N/A;  . ROTATOR CUFF REPAIR     left  . TUBAL LIGATION  1992    There were no vitals filed for this visit.    Subjective Assessment - 07/03/20 1059    Subjective Pt reports she was diagnosed with lumbar stenosis (2008). Pt reports that lately her back pain has worsened. She states in the store she has to push the cart and needs to bend over. Pt states that as she bends over more it starts to get worse. Pt reports increased pain and numbness in R leg and L side sciatica.    Limitations Walking;House hold activities;Standing;Lifting;Sitting    How long can you sit comfortably? ~20 minutes    How long can you stand comfortably? <10 minutes    How long can you walk comfortably? <10 minutes    Patient Stated Goals Reduce pain and get her back so she can stand up straight.    Currently in Pain? Yes    Pain  Score 8     Pain Location Back    Pain Orientation Lower;Right;Left    Pain Radiating Towards Sciatica mostly into back L thigh    Pain Onset More than a month ago    Pain Frequency Constant    Aggravating Factors  Activity    Pain Relieving Factors Pain medication and rest    Effect of Pain on Daily Activities Difficulty shopping and performing ADLs              [facility] PT Assessment - 07/03/20 0001      Assessment   Medical Diagnosis G89.4 (ICD-10-CM) - Chronic pain syndrome    Referring Provider (PT) Sid Falcon, MD    Onset Date/Surgical Date --   Exacerbated within the last few weeks; but  pain is chronic   Prior Therapy Yes, in 2008 for her lumbar stenosis      Balance Screen   Has the patient fallen in the past 6 months No      [place] residence    Living Arrangements Alone    Available Help at Discharge Family    Type of [place] Access Level entry      Prior Function   Level of Independence Independent      Observation/Other Assessments   Focus on Therapeutic Outcomes (FOTO)  75%      Posture/Postural Control   Posture/Postural Control Postural limitations    Postural Limitations Rounded Shoulders;Forward head;Increased thoracic kyphosis;Anterior pelvic tilt      AROM   Lumbar Flexion 90%    Lumbar Extension 10%    Lumbar - Right Side Bend 50%    Lumbar - Left Side Bend 40%    Lumbar - Right Rotation 100%    Lumbar - Left Rotation 80%      Strength   Right Hip Flexion 3+/5    Right Hip Extension 3+/5    Right Hip External Rotation  3+/5    Right Hip Internal Rotation 3+/5    Right Hip ABduction 3+/5    Left Hip Flexion 3+/5    Left Hip Extension 3+/5    Left Hip External Rotation 3+/5    Left Hip Internal Rotation 3+/5    Left Hip ABduction 3+/5      Flexibility   Hamstrings ~60 deg bilat    Quadratus Lumborum Tight      FABER test   findings Negative    Comment --   Feels more in back     Straight Leg Raise   Findings Positive    Comment Feels bilaterally; feels hamstring & in back                      Objective measurements completed on examination: See above findings.               PT Education - 07/03/20 1149    Education Details Discussed core stabilization to improve back pain. Discussed exam findings. Pt to be educated on FOTO score next visit.    Person(s) Educated Patient    Methods Explanation;Demonstration;Tactile cues;Verbal cues;Handout    Comprehension Verbalized understanding;Returned demonstration;Tactile cues required;Verbal cues required             PT Short Term Goals - 07/03/20 1158      PT SHORT TERM GOAL #1   Title Pt will be independent with initial HEP    Baseline New HEP provided  Time 4    Period Weeks    Status New    Target Date 07/31/20      PT SHORT TERM GOAL #2   Title Pt will have improved bilat LE strength to at least 4/5    Baseline Grossly 3+/5 bilaterally    Time 4    Period Weeks    Status New    Target Date 07/31/20      PT SHORT TERM GOAL #3   Title Pt will be able to tolerate standing at least 10 minutes with pain <4/10    Baseline Able to stand 5 minutes    Time 4    Period Weeks    Status New    Target Date 07/31/20      PT SHORT TERM GOAL #4   Title Pt will be able to demonstrate proper body mechanics to lift at least 10 lbs for groceries    Baseline Unable without pain    Time 4    Period Weeks    Status New    Target Date 07/31/20             PT Long Term Goals - 07/03/20 1200      PT LONG TERM GOAL #1   Title Pt will be independent with advanced HEP    Time 8    Period Weeks    Status New    Target Date 08/28/20      PT LONG TERM GOAL #2   Title Pt will be able to walk at least 20 minutes with pain </= 4/10    Baseline Tolerates ~10 min    Time 8    Period Weeks    Status New    Target Date 08/28/20      PT LONG TERM GOAL #3   Title Pt will be able to demonstrate no trunk flexion during standing tasks for at least 10 minutes    Baseline Pt stands with ~20 deg of trunk flexion    Time 8    Period Weeks    Status New    Target Date 08/28/20      PT LONG TERM GOAL #4   Title Pt will demonstrate improved bilat LE strength by squatting with at least 25 lbs    Baseline Unable    Time 8    Period Weeks    Status New    Target Date 08/28/20      PT LONG TERM GOAL #5   Title Pt will have improved FOTO score to at least 57%    Baseline FOTO 75%    Time 8    Period Weeks    Status New    Target Date 08/28/20                  Plan - 07/03/20 1150      Clinical Impression Statement Pt is a 59 y.o. F presenting to clinic due to exacerbation of her low back pain. Pt demonstrates bilat LE weakness, hip/low back tightness, decreased lumbar ROM, pain, and poor posture affecting pt's ability to perform ADLs and community tasks. Pt would benefit from therapy to address these issues and optimize her level of function.    Personal Factors and Comorbidities Fitness;Past/Current Experience;Comorbidity 1;Comorbidity 2;Time since onset of injury/illness/exacerbation    Comorbidities Morbid obesity, OA, chronic knee pain (R worse than L)    Examination-Activity Limitations Bed Mobility;Bend;Caring for Others;Carry;Dressing;Hygiene/Grooming;Lift;Locomotion Level;Sit;Squat;Stairs;Stand;Transfers    Examination-Participation Restrictions Cleaning;Community Activity;Laundry;Meal Prep;Shop  Stability/Clinical Decision Making Evolving/Moderate complexity    Clinical Decision Making Moderate    Rehab Potential Good    PT Frequency 2x / week    PT Duration 8 weeks    PT Treatment/Interventions ADLs/Self Care Home Management;Biofeedback;Cryotherapy;Electrical Stimulation;Iontophoresis 4mg /ml Dexamethasone;Moist Heat;Traction;Ultrasound;Gait training;Stair training;Functional mobility training;Therapeutic activities;Therapeutic exercise;Balance training;Neuromuscular re-education;Patient/family education;Manual techniques;Passive range of motion;Dry needling;Taping;Vasopneumatic Device;Spinal Manipulations;Joint Manipulations    PT Next Visit Plan Assess response to HEP. Progress stretching and provide manual for low back tightness as able. Continue core stabilization and LE strengthening. Educate pt on FOTO score.    PT Home Exercise Plan Access Code: [email or web address]    Consulted and Agree with Plan of Care Patient           Patient will benefit from skilled therapeutic intervention in order to improve the following deficits and impairments:  Decreased range of motion,  Difficulty walking, Increased fascial restricitons, Increased muscle spasms, Obesity, Decreased endurance, Pain, Hypomobility, Impaired flexibility, Improper body mechanics, Decreased mobility, Decreased strength, Postural dysfunction  Visit Diagnosis: Abnormal posture - Plan: PT plan of care cert/re-cert  Muscle weakness (generalized) - Plan: PT plan of care cert/re-cert  Chronic bilateral low back pain with bilateral sciatica - Plan: PT plan of care cert/re-cert  Muscle spasm of back - Plan: PT plan of care cert/re-cert     Problem List Patient Active Problem List   Diagnosis Date Noted  . New onset headache 03/12/2020  . Vaginal candidiasis 03/12/2020  . Prediabetes 11/02/2019  . Hip pain 05/29/2019  . Lymphedema 02/03/2019  . Normocytic anemia 11/12/2018  . Achilles tendonitis, bilateral 01/31/2018  . 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 Family history of colon cancer   . Benign neoplasm of transverse colon   . Plantar fasciitis, bilateral 09/23/2016  . S/P hysterectomy with oophorectomy 12/26/2015  . Carpal tunnel syndrome 11/11/2015  . Eczema 11/11/2015  . Allergic rhinitis 09/23/2015  . Sinusitis 11/21/2014  . Lower leg edema, chronic  10/19/2012  . Routine health maintenance 10/07/2012  . Vertigo 10/07/2012  . Chronic pain syndrome 10/07/2012  . Acute sinusitis 01/20/2011  . Morbid obesity 08/28/2010  . Obstructive sleep apnea on CPAP 06/20/2010  . Diverticulosis 03/26/2010  . Microscopic hematuria 01/13/2010  . GERD 09/05/2009  . Hyperlipidemia with target LDL less than 130 09/25/2008  . Essential hypertension 09/25/2008    [facility] April Ma L Iseah Plouff PT, DPT 07/03/2020, 12:11 PM  [facility] [address] [place], [place], [address] Phone: [phone]   Fax:  [phone]  Name: Amy Jarvis MRN: [id number] Date of Birth: 1961-05-22

## 2020-07-10 ENCOUNTER — Other Ambulatory Visit: Payer: Self-pay

## 2020-07-10 ENCOUNTER — Ambulatory Visit: Payer: Medicare Other | Admitting: Physical Therapy

## 2020-07-10 ENCOUNTER — Encounter: Payer: Self-pay | Admitting: Physical Therapy

## 2020-07-10 DIAGNOSIS — M6281 Muscle weakness (generalized): Secondary | ICD-10-CM

## 2020-07-10 DIAGNOSIS — R293 Abnormal posture: Secondary | ICD-10-CM

## 2020-07-10 DIAGNOSIS — M62830 Muscle spasm of back: Secondary | ICD-10-CM

## 2020-07-10 DIAGNOSIS — M5442 Lumbago with sciatica, left side: Secondary | ICD-10-CM

## 2020-07-10 NOTE — Therapy (Signed)
[place], [place], [address] Phone: [phone]   Fax:  [phone]  Physical Therapy Treatment  Patient Details  Name: Amy Jarvis MRN: [id number] Date of Birth: January 17, 1961 Referring Provider (PT): Sid Falcon, MD   Encounter Date: 07/10/2020   PT End of Session - 07/10/20 1541    Visit Number 2    Number of Visits 16    Authorization Type MCR - 10th visit progress note    PT Start Time 1507   pt arrived late   PT Stop Time 1542    PT Time Calculation (min) 35 min    Activity Tolerance Patient tolerated treatment well    Behavior During Therapy [facility] for tasks assessed/performed           Past Medical History:  Diagnosis Date  . Allergic rhinitis 09/23/2015  . Carpal tunnel syndrome, bilateral   . Cervical polyp    possible history of cervical polyp  . Complication of anesthesia    low bp after hysterctomy  . Cystocele 01/13/2010  . DDD (degenerative disc disease), lumbar   . Degenerative disk disease 10/07/2012  . Degenerative lumbar spinal stenosis   . Diverticulosis 03/26/2010  . GERD 09/05/2009  . Hyperlipidemia LDL goal < 130 09/25/2008  . Hypertension 09/25/2008  . Microscopic hematuria 01/13/2010  . Morbid obesity 08/28/2010  . Obstructive sleep apnea on CPAP 06/20/2010   cpap   . Plantar fasciitis   . Pneumonia    6/16  . Uterine prolapse 11/19/2008  . Vertigo 10/07/2012    Past Surgical History:  Procedure Laterality Date  . CHOLECYSTECTOMY  2010  . COLONOSCOPY    . COLONOSCOPY WITH PROPOFOL N/A 03/23/2017   Procedure: COLONOSCOPY WITH PROPOFOL;  Surgeon: Ladene Artist, MD;  Location: [facility];  Service: Endoscopy;  Laterality: N/A;  . CYSTOSCOPY N/A 12/26/2015   Procedure: CYSTOSCOPY;  Surgeon: Linda Hedges, DO;  Location: [facility];  Service: Gynecology;  Laterality: N/A;  . HYSTEROSCOPY WITH D & C N/A 01/02/2014   Procedure: DILATATION AND CURETTAGE /HYSTEROSCOPY;   Surgeon: Linda Hedges, DO;  Location: [facility];  Service: Gynecology;  Laterality: N/A;  . LAPAROSCOPIC VAGINAL HYSTERECTOMY WITH SALPINGO OOPHORECTOMY Bilateral 12/26/2015   Procedure: LAPAROSCOPIC ASSISTED VAGINAL HYSTERECTOMY WITH SALPINGO OOPHORECTOMY;  Surgeon: Linda Hedges, DO;  Location: [facility];  Service: Gynecology;  Laterality: Bilateral;  . RECTOCELE REPAIR N/A 12/26/2015   Procedure: POSTERIOR REPAIR (RECTOCELE);  Surgeon: Linda Hedges, DO;  Location: [facility];  Service: Gynecology;  Laterality: N/A;  . ROTATOR CUFF REPAIR     left  . TUBAL LIGATION  1992    There were no vitals filed for this visit.   Subjective Assessment - 07/10/20 1512    Subjective have to stretch to decrease stiffness. no sharp pains.    Currently in Pain? Yes    Pain Score 7     Pain Location Back    Pain Orientation Left;Right;Lower    Pain Descriptors / Indicators --   stiff   Pain Relieving Factors stretching                             [facility] Adult PT Treatment/Exercise - 07/10/20 0001      Exercises   Exercises Knee/Hip      Knee/Hip Exercises: Stretches   Passive Hamstring Stretch Limitations seated EOB    Other Knee/Hip Stretches pelvic tilts in seated, LTR x4 each  Other Knee/Hip Stretches physioball roll out for lower lumbar stretch- center & diagonal      Knee/Hip Exercises: Seated   Other Seated Knee/Hip Exercises seated iso ab press into physioball      Knee/Hip Exercises: Supine   Other Supine Knee/Hip Exercises post pelvic tilt progressed to marching    Other Supine Knee/Hip Exercises hooklying clam without resist- cues for core engagement, dead but ext                  PT Education - 07/10/20 1543    Education Details FOTO report    Person(s) Educated Patient    Methods Explanation;Demonstration;Tactile cues;Verbal cues;Handout    Comprehension Verbalized understanding;Returned demonstration;Verbal cues required;Tactile cues required;Need further  instruction            PT Short Term Goals - 07/03/20 1158      PT SHORT TERM GOAL #1   Title Pt will be independent with initial HEP    Baseline New HEP provided    Time 4    Period Weeks    Status New    Target Date 07/31/20      PT SHORT TERM GOAL #2   Title Pt will have improved bilat LE strength to at least 4/5    Baseline Grossly 3+/5 bilaterally    Time 4    Period Weeks    Status New    Target Date 07/31/20      PT SHORT TERM GOAL #3   Title Pt will be able to tolerate standing at least 10 minutes with pain <4/10    Baseline Able to stand 5 minutes    Time 4    Period Weeks    Status New    Target Date 07/31/20      PT SHORT TERM GOAL #4   Title Pt will be able to demonstrate proper body mechanics to lift at least 10 lbs for groceries    Baseline Unable without pain    Time 4    Period Weeks    Status New    Target Date 07/31/20             PT Long Term Goals - 07/03/20 1200      PT LONG TERM GOAL #1   Title Pt will be independent with advanced HEP    Time 8    Period Weeks    Status New    Target Date 08/28/20      PT LONG TERM GOAL #2   Title Pt will be able to walk at least 20 minutes with pain </= 4/10    Baseline Tolerates ~10 min    Time 8    Period Weeks    Status New    Target Date 08/28/20      PT LONG TERM GOAL #3   Title Pt will be able to demonstrate no trunk flexion during standing tasks for at least 10 minutes    Baseline Pt stands with ~20 deg of trunk flexion    Time 8    Period Weeks    Status New    Target Date 08/28/20      PT LONG TERM GOAL #4   Title Pt will demonstrate improved bilat LE strength by squatting with at least 25 lbs    Baseline Unable    Time 8    Period Weeks    Status New    Target Date 08/28/20      PT LONG TERM GOAL #  5   Title Pt will have improved FOTO score to at least 57%    Baseline FOTO 75%    Time 8    Period Weeks    Status New    Target Date 08/28/20                  Plan - 07/10/20 1536    Clinical Impression Statement progressed exercises to encourage core control which pt tolerated well. cues required to avoid holding breath. discussed breathing & relaxation techniques to decrease spasm, esp when laying down.    PT Treatment/Interventions ADLs/Self Care Home Management;Biofeedback;Cryotherapy;Electrical Stimulation;Iontophoresis 4mg /ml Dexamethasone;Moist Heat;Traction;Ultrasound;Gait training;Stair training;Functional mobility training;Therapeutic activities;Therapeutic exercise;Balance training;Neuromuscular re-education;Patient/family education;Manual techniques;Passive range of motion;Dry needling;Taping;Vasopneumatic Device;Spinal Manipulations;Joint Manipulations    PT Next Visit Plan cont to progress core, manual PRN    PT Home Exercise Plan Access Code: [email or web address]    Consulted and Agree with Plan of Care Patient           Patient will benefit from skilled therapeutic intervention in order to improve the following deficits and impairments:  Decreased range of motion, Difficulty walking, Increased fascial restricitons, Increased muscle spasms, Obesity, Decreased endurance, Pain, Hypomobility, Impaired flexibility, Improper body mechanics, Decreased mobility, Decreased strength, Postural dysfunction  Visit Diagnosis: Muscle weakness (generalized)  Chronic bilateral low back pain with bilateral sciatica  Abnormal posture  Muscle spasm of back     Problem List Patient Active Problem List   Diagnosis Date Noted  . New onset headache 03/12/2020  . Vaginal candidiasis 03/12/2020  . Prediabetes 11/02/2019  . Hip pain 05/29/2019  . Lymphedema 02/03/2019  . Normocytic anemia 11/12/2018  . Achilles tendonitis, bilateral 01/31/2018  . 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 Family history of colon cancer   . Benign neoplasm of transverse  colon   . Plantar fasciitis, bilateral 09/23/2016  . S/P hysterectomy with oophorectomy 12/26/2015  . Carpal tunnel syndrome 11/11/2015  . Eczema 11/11/2015  . Allergic rhinitis 09/23/2015  . Sinusitis 11/21/2014  . Lower leg edema, chronic  10/19/2012  . Routine health maintenance 10/07/2012  . Vertigo 10/07/2012  . Chronic pain syndrome 10/07/2012  . Acute sinusitis 01/20/2011  . Morbid obesity 08/28/2010  . Obstructive sleep apnea on CPAP 06/20/2010  . Diverticulosis 03/26/2010  . Microscopic hematuria 01/13/2010  . GERD 09/05/2009  . Hyperlipidemia with target LDL less than 130 09/25/2008  . Essential hypertension 09/25/2008    Naylene Foell C. Anchor Dwan PT, DPT 07/10/20 3:43 PM   [facility] [address] [place], [place], [address] Phone: [phone]   Fax:  [phone]  Name: RYIAN LYNDE MRN: [id number] Date of Birth: 07/10/1961

## 2020-07-16 ENCOUNTER — Other Ambulatory Visit: Payer: Self-pay

## 2020-07-16 ENCOUNTER — Other Ambulatory Visit: Payer: Medicare Other

## 2020-07-16 DIAGNOSIS — Z20822 Contact with and (suspected) exposure to covid-19: Secondary | ICD-10-CM

## 2020-07-17 LAB — NOVEL CORONAVIRUS, NAA: SARS-CoV-2, NAA: NOT DETECTED

## 2020-07-17 LAB — SARS-COV-2, NAA 2 DAY TAT

## 2020-07-18 ENCOUNTER — Ambulatory Visit: Payer: Medicare Other | Admitting: Physical Therapy

## 2020-07-19 ENCOUNTER — Other Ambulatory Visit: Payer: Self-pay | Admitting: *Deleted

## 2020-07-19 DIAGNOSIS — R7303 Prediabetes: Secondary | ICD-10-CM

## 2020-07-19 MED ORDER — VICTOZA 18 MG/3ML ~~LOC~~ SOPN: PEN_INJECTOR | SUBCUTANEOUS | 0 refills | Status: DC

## 2020-07-22 ENCOUNTER — Other Ambulatory Visit: Payer: Self-pay

## 2020-07-22 ENCOUNTER — Ambulatory Visit: Payer: Medicare Other | Admitting: Physical Therapy

## 2020-07-22 ENCOUNTER — Telehealth: Payer: Self-pay | Admitting: Physical Therapy

## 2020-07-22 DIAGNOSIS — R293 Abnormal posture: Secondary | ICD-10-CM | POA: Diagnosis not present

## 2020-07-22 DIAGNOSIS — M6281 Muscle weakness (generalized): Secondary | ICD-10-CM

## 2020-07-22 DIAGNOSIS — M5441 Lumbago with sciatica, right side: Secondary | ICD-10-CM

## 2020-07-22 DIAGNOSIS — M62830 Muscle spasm of back: Secondary | ICD-10-CM

## 2020-07-22 NOTE — Telephone Encounter (Signed)
PT called pt in regards to her 11:30 am appointment  Pt states she is on the way. PT informed pt that she is already 20 minutes late to her appointment. Pt states she thought it was at 11:45 am. PT offered pt 5pm appointment later today. Pt agreeable to this.   Tashica Provencio April Gordy Levan, PT, DPT

## 2020-07-22 NOTE — Therapy (Signed)
[place], [place], [address] Phone: [phone]   Fax:  [phone]  Physical Therapy Treatment  Patient Details  Name: Amy Jarvis MRN: [id number] Date of Birth: 1961/11/26 Referring Provider (PT): Sid Falcon, MD   Encounter Date: 07/22/2020   PT End of Session - 07/22/20 1658    Visit Number 3    Number of Visits 16    Authorization Type MCR - 10th visit progress note    PT Start Time 1700    PT Stop Time 5329    PT Time Calculation (min) 47 min    Activity Tolerance Patient tolerated treatment well    Behavior During Therapy [facility] for tasks assessed/performed           Past Medical History:  Diagnosis Date   Allergic rhinitis 09/23/2015   Carpal tunnel syndrome, bilateral    Cervical polyp    possible history of cervical polyp   Complication of anesthesia    low bp after hysterctomy   Cystocele 01/13/2010   DDD (degenerative disc disease), lumbar    Degenerative disk disease 10/07/2012   Degenerative lumbar spinal stenosis    Diverticulosis 03/26/2010   GERD 09/05/2009   Hyperlipidemia LDL goal < 130 09/25/2008   Hypertension 09/25/2008   Microscopic hematuria 01/13/2010   Morbid obesity 08/28/2010   Obstructive sleep apnea on CPAP 06/20/2010   cpap    Plantar fasciitis    Pneumonia    6/16   Uterine prolapse 11/19/2008   Vertigo 10/07/2012    Past Surgical History:  Procedure Laterality Date   CHOLECYSTECTOMY  2010   COLONOSCOPY     COLONOSCOPY WITH PROPOFOL N/A 03/23/2017   Procedure: COLONOSCOPY WITH PROPOFOL;  Surgeon: Ladene Artist, MD;  Location: [facility];  Service: Endoscopy;  Laterality: N/A;   CYSTOSCOPY N/A 12/26/2015   Procedure: CYSTOSCOPY;  Surgeon: Linda Hedges, DO;  Location: [facility];  Service: Gynecology;  Laterality: N/A;   HYSTEROSCOPY WITH D & C N/A 01/02/2014   Procedure: DILATATION AND CURETTAGE /HYSTEROSCOPY;  Surgeon: Linda Hedges, DO;  Location: [facility];  Service: Gynecology;  Laterality: N/A;   LAPAROSCOPIC VAGINAL HYSTERECTOMY WITH SALPINGO OOPHORECTOMY Bilateral 12/26/2015   Procedure: LAPAROSCOPIC ASSISTED VAGINAL HYSTERECTOMY WITH SALPINGO OOPHORECTOMY;  Surgeon: Linda Hedges, DO;  Location: [facility];  Service: Gynecology;  Laterality: Bilateral;   RECTOCELE REPAIR N/A 12/26/2015   Procedure: POSTERIOR REPAIR (RECTOCELE);  Surgeon: Linda Hedges, DO;  Location: [facility];  Service: Gynecology;  Laterality: N/A;   ROTATOR CUFF REPAIR     left   TUBAL LIGATION  1992    There were no vitals filed for this visit.   Subjective Assessment - 07/22/20 1702    Subjective Pt reports that exercises have been doing fairly good at home.    Limitations Walking;House hold activities;Standing;Lifting;Sitting    How long can you sit comfortably? ~20 minutes    How long can you stand comfortably? <10 minutes    Patient Stated Goals Reduce pain and get her back so she can stand up straight.    Currently in Pain? Yes    Pain Score 8     Pain Location Back                             [facility] Adult PT Treatment/Exercise - 07/22/20 0001      Knee/Hip Exercises: Stretches   Passive Hamstring Stretch Right;Left;30 seconds  Other Knee/Hip Stretches LTR 3x 20 sec bilat    Other Knee/Hip Stretches piriformis stretch x30 sec each, single leg to chest bilat x 30 sec      Knee/Hip Exercises: Aerobic   Nustep L4 x 5 min UE & LE      Knee/Hip Exercises: Standing   Heel Raises --    Hip Abduction Stengthening;Both;10 reps    Abduction Limitations red tband    Hip Extension Stengthening;Both;10 reps    Extension Limitations red tband      Knee/Hip Exercises: Seated   Other Seated Knee/Hip Exercises Seated PPT with ab set x10      Knee/Hip Exercises: Supine   Bridges Strengthening;Both;5 reps    Other Supine Knee/Hip Exercises post pelvic tilt x10 progressed to marching 2x 10 bilat    Other Supine Knee/Hip  Exercises hooklying clam with green tband 2x10 cues for core engagement, pball under ankles + crunch 2x10      Manual Therapy   Manual Therapy Soft tissue mobilization    Soft tissue mobilization L glute med/minimus, QL, tennis ball release                    PT Short Term Goals - 07/03/20 1158      PT SHORT TERM GOAL #1   Title Pt will be independent with initial HEP    Baseline New HEP provided    Time 4    Period Weeks    Status New    Target Date 07/31/20      PT SHORT TERM GOAL #2   Title Pt will have improved bilat LE strength to at least 4/5    Baseline Grossly 3+/5 bilaterally    Time 4    Period Weeks    Status New    Target Date 07/31/20      PT SHORT TERM GOAL #3   Title Pt will be able to tolerate standing at least 10 minutes with pain <4/10    Baseline Able to stand 5 minutes    Time 4    Period Weeks    Status New    Target Date 07/31/20      PT SHORT TERM GOAL #4   Title Pt will be able to demonstrate proper body mechanics to lift at least 10 lbs for groceries    Baseline Unable without pain    Time 4    Period Weeks    Status New    Target Date 07/31/20             PT Long Term Goals - 07/03/20 1200      PT LONG TERM GOAL #1   Title Pt will be independent with advanced HEP    Time 8    Period Weeks    Status New    Target Date 08/28/20      PT LONG TERM GOAL #2   Title Pt will be able to walk at least 20 minutes with pain </= 4/10    Baseline Tolerates ~10 min    Time 8    Period Weeks    Status New    Target Date 08/28/20      PT LONG TERM GOAL #3   Title Pt will be able to demonstrate no trunk flexion during standing tasks for at least 10 minutes    Baseline Pt stands with ~20 deg of trunk flexion    Time 8    Period Weeks    Status New  Target Date 08/28/20      PT LONG TERM GOAL #4   Title Pt will demonstrate improved bilat LE strength by squatting with at least 25 lbs    Baseline Unable    Time 8    Period  Weeks    Status New    Target Date 08/28/20      PT LONG TERM GOAL #5   Title Pt will have improved FOTO score to at least 57%    Baseline FOTO 75%    Time 8    Period Weeks    Status New    Target Date 08/28/20                 Plan - 07/22/20 1750    Clinical Impression Statement Provided stretches due to pt with glute and posterolateral hip tightness. Continued to progress core and bilat LE strengthening. Some mild irritation of her sciatica noted -- eased with self trigger point release using tennis ball.    Personal Factors and Comorbidities Fitness;Past/Current Experience;Comorbidity 1;Comorbidity 2;Time since onset of injury/illness/exacerbation    Comorbidities Morbid obesity, OA, chronic knee pain (R worse than L)    Examination-Activity Limitations Bed Mobility;Bend;Caring for Others;Carry;Dressing;Hygiene/Grooming;Lift;Locomotion Level;Sit;Squat;Stairs;Stand;Transfers    Examination-Participation Restrictions Cleaning;Community Activity;Laundry;Meal Prep;Shop    PT Treatment/Interventions ADLs/Self Care Home Management;Biofeedback;Cryotherapy;Electrical Stimulation;Iontophoresis 4mg /ml Dexamethasone;Moist Heat;Traction;Ultrasound;Gait training;Stair training;Functional mobility training;Therapeutic activities;Therapeutic exercise;Balance training;Neuromuscular re-education;Patient/family education;Manual techniques;Passive range of motion;Dry needling;Taping;Vasopneumatic Device;Spinal Manipulations;Joint Manipulations    PT Next Visit Plan cont to progress core & hip strengthening, manual PRN    PT Home Exercise Plan Access Code: [email or web address]    Consulted and Agree with Plan of Care Patient           Patient will benefit from skilled therapeutic intervention in order to improve the following deficits and impairments:  Decreased range of motion, Difficulty walking, Increased fascial restricitons, Increased muscle spasms, Obesity, Decreased endurance, Pain, Hypomobility,  Impaired flexibility, Improper body mechanics, Decreased mobility, Decreased strength, Postural dysfunction  Visit Diagnosis: Muscle weakness (generalized)  Chronic bilateral low back pain with bilateral sciatica  Abnormal posture  Muscle spasm of back     Problem List Patient Active Problem List   Diagnosis Date Noted   New onset headache 03/12/2020   Vaginal candidiasis 03/12/2020   Prediabetes 11/02/2019   Hip pain 05/29/2019   Lymphedema 02/03/2019   Normocytic anemia 11/12/2018   Achilles tendonitis, bilateral 01/31/2018   Asymmetric SNHL (sensorineural hearing loss) 08/05/2017   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   Tinnitus aurium, right 08/05/2017   Family history of colon cancer    Benign neoplasm of transverse colon    Plantar fasciitis, bilateral 09/23/2016   S/P hysterectomy with oophorectomy 12/26/2015   Carpal tunnel syndrome 11/11/2015   Eczema 11/11/2015   Allergic rhinitis 09/23/2015   Sinusitis 11/21/2014   Lower leg edema, chronic  10/19/2012   Routine health maintenance 10/07/2012   Vertigo 10/07/2012   Chronic pain syndrome 10/07/2012   Acute sinusitis 01/20/2011   Morbid obesity 08/28/2010   Obstructive sleep apnea on CPAP 06/20/2010   Diverticulosis 03/26/2010   Microscopic hematuria 01/13/2010   GERD 09/05/2009   Hyperlipidemia with target LDL less than 130 09/25/2008   Essential hypertension 09/25/2008    Geni Skorupski April Ma L Revan Gendron PT, DPT 07/22/2020, 6:00 PM  [facility] [address] [place], [place], [address] Phone: [phone]   Fax:  [phone]  Name: AMBREA HEGLER MRN: [id number] Date of Birth: [birth year]

## 2020-07-24 ENCOUNTER — Ambulatory Visit: Payer: Medicare Other | Admitting: Physical Therapy

## 2020-07-29 ENCOUNTER — Encounter: Payer: Self-pay | Admitting: Physical Therapy

## 2020-07-29 ENCOUNTER — Other Ambulatory Visit: Payer: Self-pay

## 2020-07-29 ENCOUNTER — Ambulatory Visit: Payer: Medicare Other | Admitting: Physical Therapy

## 2020-07-29 DIAGNOSIS — R293 Abnormal posture: Secondary | ICD-10-CM

## 2020-07-29 DIAGNOSIS — G8929 Other chronic pain: Secondary | ICD-10-CM

## 2020-07-29 DIAGNOSIS — M62830 Muscle spasm of back: Secondary | ICD-10-CM

## 2020-07-29 DIAGNOSIS — M6281 Muscle weakness (generalized): Secondary | ICD-10-CM

## 2020-07-29 NOTE — Therapy (Signed)
[place], [place], [address] Phone: [phone]   Fax:  [phone]  Physical Therapy Treatment  Patient Details  Name: Amy Jarvis MRN: [id number] Date of Birth: 01-15-1961 Referring Provider (PT): Sid Falcon, MD   Encounter Date: 07/29/2020   PT End of Session - 07/29/20 1137    Visit Number 4    Number of Visits 16    Authorization Type MCR - 10th visit progress note    PT Start Time 4970    PT Stop Time 1223    PT Time Calculation (min) 46 min    Activity Tolerance Patient tolerated treatment well    Behavior During Therapy [facility] for tasks assessed/performed           Past Medical History:  Diagnosis Date  . Allergic rhinitis 09/23/2015  . Carpal tunnel syndrome, bilateral   . Cervical polyp    possible history of cervical polyp  . Complication of anesthesia    low bp after hysterctomy  . Cystocele 01/13/2010  . DDD (degenerative disc disease), lumbar   . Degenerative disk disease 10/07/2012  . Degenerative lumbar spinal stenosis   . Diverticulosis 03/26/2010  . GERD 09/05/2009  . Hyperlipidemia LDL goal < 130 09/25/2008  . Hypertension 09/25/2008  . Microscopic hematuria 01/13/2010  . Morbid obesity 08/28/2010  . Obstructive sleep apnea on CPAP 06/20/2010   cpap   . Plantar fasciitis   . Pneumonia    6/16  . Uterine prolapse 11/19/2008  . Vertigo 10/07/2012    Past Surgical History:  Procedure Laterality Date  . CHOLECYSTECTOMY  2010  . COLONOSCOPY    . COLONOSCOPY WITH PROPOFOL N/A 03/23/2017   Procedure: COLONOSCOPY WITH PROPOFOL;  Surgeon: Ladene Artist, MD;  Location: [facility];  Service: Endoscopy;  Laterality: N/A;  . CYSTOSCOPY N/A 12/26/2015   Procedure: CYSTOSCOPY;  Surgeon: Linda Hedges, DO;  Location: [facility];  Service: Gynecology;  Laterality: N/A;  . HYSTEROSCOPY WITH D & C N/A 01/02/2014   Procedure: DILATATION AND CURETTAGE /HYSTEROSCOPY;  Surgeon: Linda Hedges, DO;  Location: [facility];  Service: Gynecology;  Laterality: N/A;  . LAPAROSCOPIC VAGINAL HYSTERECTOMY WITH SALPINGO OOPHORECTOMY Bilateral 12/26/2015   Procedure: LAPAROSCOPIC ASSISTED VAGINAL HYSTERECTOMY WITH SALPINGO OOPHORECTOMY;  Surgeon: Linda Hedges, DO;  Location: [facility];  Service: Gynecology;  Laterality: Bilateral;  . RECTOCELE REPAIR N/A 12/26/2015   Procedure: POSTERIOR REPAIR (RECTOCELE);  Surgeon: Linda Hedges, DO;  Location: [facility];  Service: Gynecology;  Laterality: N/A;  . ROTATOR CUFF REPAIR     left  . TUBAL LIGATION  1992    There were no vitals filed for this visit.   Subjective Assessment - 07/29/20 1139    Subjective Pt states she's been doing good. No new complaints.    Limitations Walking;House hold activities;Standing;Lifting;Sitting    How long can you sit comfortably? ~20 minutes    How long can you stand comfortably? <10 minutes    Patient Stated Goals Reduce pain and get her back so she can stand up straight.    Currently in Pain? Yes    Pain Score 7     Pain Location Back    Pain Orientation Lower                             [facility] Adult PT Treatment/Exercise - 07/29/20 0001      Knee/Hip Exercises: Stretches   Hip Flexor Stretch  Both;30 seconds   in prone, towel under knee   Other Knee/Hip Stretches quad stretch x 30 sec bilat      Knee/Hip Exercises: Aerobic   Nustep L5 x 6 min UE & LE      Knee/Hip Exercises: Sidelying   Hip ABduction Strengthening;Both;2 sets;10 reps      Knee/Hip Exercises: Prone   Hip Extension Strengthening;Both;2 sets;10 reps      Manual Therapy   Manual Therapy Joint mobilization    Manual therapy comments anterior hip glide grade III bilat, lateral & posterior grade II to III mobs                    PT Short Term Goals - 07/29/20 1224      PT SHORT TERM GOAL #1   Title Pt will be independent with initial HEP    Baseline New HEP provided    Time 4    Period Weeks    Status  Achieved    Target Date 07/31/20      PT SHORT TERM GOAL #2   Title Pt will have improved bilat LE strength to at least 4/5    Baseline Grossly 3+/5 bilaterally    Time 4    Period Weeks    Status On-going    Target Date 07/31/20      PT SHORT TERM GOAL #3   Title Pt will be able to tolerate standing at least 10 minutes with pain <4/10    Baseline Able to stand 5 minutes    Time 4    Period Weeks    Status On-going    Target Date 07/31/20      PT SHORT TERM GOAL #4   Title Pt will be able to demonstrate proper body mechanics to lift at least 10 lbs for groceries    Baseline Unable without pain    Time 4    Period Weeks    Status On-going    Target Date 07/31/20             PT Long Term Goals - 07/03/20 1200      PT LONG TERM GOAL #1   Title Pt will be independent with advanced HEP    Time 8    Period Weeks    Status New    Target Date 08/28/20      PT LONG TERM GOAL #2   Title Pt will be able to walk at least 20 minutes with pain </= 4/10    Baseline Tolerates ~10 min    Time 8    Period Weeks    Status New    Target Date 08/28/20      PT LONG TERM GOAL #3   Title Pt will be able to demonstrate no trunk flexion during standing tasks for at least 10 minutes    Baseline Pt stands with ~20 deg of trunk flexion    Time 8    Period Weeks    Status New    Target Date 08/28/20      PT LONG TERM GOAL #4   Title Pt will demonstrate improved bilat LE strength by squatting with at least 25 lbs    Baseline Unable    Time 8    Period Weeks    Status New    Target Date 08/28/20      PT LONG TERM GOAL #5   Title Pt will have improved FOTO score to at least 57%  Baseline FOTO 75%    Time 8    Period Weeks    Status New    Target Date 08/28/20                 Plan - 07/29/20 1209    Clinical Impression Statement Treatment focused on continued hip strengthening. Progressed pt to core stabilization exercises in standing. Stretches and manual therapy  provided for improved hip mobility (pt with decreased L vs R hip mobility). Pt continues to progress well with therapy and tolerated exercises with no issues.    Personal Factors and Comorbidities Fitness;Past/Current Experience;Comorbidity 1;Comorbidity 2;Time since onset of injury/illness/exacerbation    Comorbidities Morbid obesity, OA, chronic knee pain (R worse than L)    Examination-Activity Limitations Bed Mobility;Bend;Caring for Others;Carry;Dressing;Hygiene/Grooming;Lift;Locomotion Level;Sit;Squat;Stairs;Stand;Transfers    Examination-Participation Restrictions Cleaning;Community Activity;Laundry;Meal Prep;Shop    PT Treatment/Interventions ADLs/Self Care Home Management;Biofeedback;Cryotherapy;Electrical Stimulation;Iontophoresis 4mg /ml Dexamethasone;Moist Heat;Traction;Ultrasound;Gait training;Stair training;Functional mobility training;Therapeutic activities;Therapeutic exercise;Balance training;Neuromuscular re-education;Patient/family education;Manual techniques;Passive range of motion;Dry needling;Taping;Vasopneumatic Device;Spinal Manipulations;Joint Manipulations    PT Next Visit Plan cont to progress core & hip strengthening, manual PRN. Continue hip mobs as needed.    PT Home Exercise Plan Access Code: [email or web address]    Consulted and Agree with Plan of Care Patient           Patient will benefit from skilled therapeutic intervention in order to improve the following deficits and impairments:  Decreased range of motion, Difficulty walking, Increased fascial restricitons, Increased muscle spasms, Obesity, Decreased endurance, Pain, Hypomobility, Impaired flexibility, Improper body mechanics, Decreased mobility, Decreased strength, Postural dysfunction  Visit Diagnosis: Muscle weakness (generalized)  Chronic bilateral low back pain with bilateral sciatica  Abnormal posture  Muscle spasm of back     Problem List Patient Active Problem List   Diagnosis Date Noted  . New onset  headache 03/12/2020  . Vaginal candidiasis 03/12/2020  . Prediabetes 11/02/2019  . Hip pain 05/29/2019  . Lymphedema 02/03/2019  . Normocytic anemia 11/12/2018  . Achilles tendonitis, bilateral 01/31/2018  . 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 Family history of colon cancer   . Benign neoplasm of transverse colon   . Plantar fasciitis, bilateral 09/23/2016  . S/P hysterectomy with oophorectomy 12/26/2015  . Carpal tunnel syndrome 11/11/2015  . Eczema 11/11/2015  . Allergic rhinitis 09/23/2015  . Sinusitis 11/21/2014  . Lower leg edema, chronic  10/19/2012  . Routine health maintenance 10/07/2012  . Vertigo 10/07/2012  . Chronic pain syndrome 10/07/2012  . Acute sinusitis 01/20/2011  . Morbid obesity 08/28/2010  . Obstructive sleep apnea on CPAP 06/20/2010  . Diverticulosis 03/26/2010  . Microscopic hematuria 01/13/2010  . GERD 09/05/2009  . Hyperlipidemia with target LDL less than 130 09/25/2008  . Essential hypertension 09/25/2008    [facility] April Ma L Glynnis Gavel PT, DPT 07/29/2020, 12:26 PM  [facility] [address] [place], [place], [address] Phone: [phone]   Fax:  [phone]  Name: Amy Jarvis MRN: [id number] Date of Birth: 1961/04/27

## 2020-07-31 ENCOUNTER — Ambulatory Visit: Payer: Medicare Other | Admitting: Physical Therapy

## 2020-08-01 ENCOUNTER — Encounter: Payer: Self-pay | Admitting: Internal Medicine

## 2020-08-06 ENCOUNTER — Other Ambulatory Visit: Payer: Self-pay

## 2020-08-06 ENCOUNTER — Encounter: Payer: Self-pay | Admitting: Physical Therapy

## 2020-08-06 ENCOUNTER — Ambulatory Visit: Payer: Medicare Other | Attending: Internal Medicine | Admitting: Physical Therapy

## 2020-08-06 DIAGNOSIS — M62830 Muscle spasm of back: Secondary | ICD-10-CM | POA: Insufficient documentation

## 2020-08-06 DIAGNOSIS — M5441 Lumbago with sciatica, right side: Secondary | ICD-10-CM | POA: Diagnosis present

## 2020-08-06 DIAGNOSIS — R293 Abnormal posture: Secondary | ICD-10-CM | POA: Insufficient documentation

## 2020-08-06 DIAGNOSIS — M6281 Muscle weakness (generalized): Secondary | ICD-10-CM | POA: Insufficient documentation

## 2020-08-06 DIAGNOSIS — G8929 Other chronic pain: Secondary | ICD-10-CM | POA: Diagnosis present

## 2020-08-06 DIAGNOSIS — M5442 Lumbago with sciatica, left side: Secondary | ICD-10-CM | POA: Insufficient documentation

## 2020-08-06 NOTE — Therapy (Signed)
[place], [place], [address] Phone: [phone]   Fax:  [phone]  Physical Therapy Treatment  Patient Details  Name: Amy Jarvis MRN: [id number] Date of Birth: [birth year] Referring Provider (PT): Sid Falcon, MD   Encounter Date: 08/06/2020   PT End of Session - 08/06/20 1130    Visit Number 5    Number of Visits 16    Authorization Type MCR - 10th visit progress note    PT Start Time 1132    PT Stop Time 1220    PT Time Calculation (min) 48 min    Activity Tolerance Patient tolerated treatment well    Behavior During Therapy [facility] for tasks assessed/performed           Past Medical History:  Diagnosis Date  . Allergic rhinitis 09/23/2015  . Carpal tunnel syndrome, bilateral   . Cervical polyp    possible history of cervical polyp  . Complication of anesthesia    low bp after hysterctomy  . Cystocele 01/13/2010  . DDD (degenerative disc disease), lumbar   . Degenerative disk disease 10/07/2012  . Degenerative lumbar spinal stenosis   . Diverticulosis 03/26/2010  . GERD 09/05/2009  . Hyperlipidemia LDL goal < 130 09/25/2008  . Hypertension 09/25/2008  . Microscopic hematuria 01/13/2010  . Morbid obesity 08/28/2010  . Obstructive sleep apnea on CPAP 06/20/2010   cpap   . Plantar fasciitis   . Pneumonia    6/16  . Uterine prolapse 11/19/2008  . Vertigo 10/07/2012    Past Surgical History:  Procedure Laterality Date  . CHOLECYSTECTOMY  2010  . COLONOSCOPY    . COLONOSCOPY WITH PROPOFOL N/A 03/23/2017   Procedure: COLONOSCOPY WITH PROPOFOL;  Surgeon: Ladene Artist, MD;  Location: [facility];  Service: Endoscopy;  Laterality: N/A;  . CYSTOSCOPY N/A 12/26/2015   Procedure: CYSTOSCOPY;  Surgeon: Linda Hedges, DO;  Location: [facility];  Service: Gynecology;  Laterality: N/A;  . HYSTEROSCOPY WITH D & C N/A 01/02/2014   Procedure: DILATATION AND CURETTAGE /HYSTEROSCOPY;  Surgeon: Linda Hedges, DO;  Location: [facility];  Service: Gynecology;  Laterality: N/A;  . LAPAROSCOPIC VAGINAL HYSTERECTOMY WITH SALPINGO OOPHORECTOMY Bilateral 12/26/2015   Procedure: LAPAROSCOPIC ASSISTED VAGINAL HYSTERECTOMY WITH SALPINGO OOPHORECTOMY;  Surgeon: Linda Hedges, DO;  Location: [facility];  Service: Gynecology;  Laterality: Bilateral;  . RECTOCELE REPAIR N/A 12/26/2015   Procedure: POSTERIOR REPAIR (RECTOCELE);  Surgeon: Linda Hedges, DO;  Location: [facility];  Service: Gynecology;  Laterality: N/A;  . ROTATOR CUFF REPAIR     left  . TUBAL LIGATION  1992    There were no vitals filed for this visit.   Subjective Assessment - 08/06/20 1134    Subjective Pt reports no new complaints. Pt notes that the exercises have been doing good -- pt states that she's been doing them daily. Pt reports she feels good after the exercises.    Limitations Walking;House hold activities;Standing;Lifting;Sitting    How long can you sit comfortably? ~20 minutes    How long can you stand comfortably? <10 minutes    How long can you walk comfortably? <10 minutes    Patient Stated Goals Reduce pain and get her back so she can stand up straight.    Currently in Pain? Yes    Pain Score 7     Pain Location Back  [place] Adult PT Treatment/Exercise - 08/06/20 0001      Knee/Hip Exercises: Stretches   Hip Flexor Stretch Both;30 seconds    Other Knee/Hip Stretches quad stretch x 30 sec bilat    Other Knee/Hip Stretches L stretch 20 sec, L stretch with side flexion bilat x 20 sec      Knee/Hip Exercises: Aerobic   Nustep L5 x 6 min UE & LE      Knee/Hip Exercises: Standing   Heel Raises 20 reps    Hip Abduction Stengthening;Both;10 reps    Abduction Limitations green tband    Hip Extension Stengthening;Both;10 reps    Extension Limitations green tband      Knee/Hip Exercises: Prone   Hip Extension Strengthening;Both;2 sets;10 reps    Other Prone Exercises Press-up x10       Manual Therapy   Manual Therapy Joint mobilization    Manual therapy comments anterior hip glide grade III bilat, lateral & posterior grade II to III mobs    Soft tissue mobilization self massage with foam roll; STW along QL and glutes                    PT Short Term Goals - 07/29/20 1224      PT SHORT TERM GOAL #1   Title Pt will be independent with initial HEP    Baseline New HEP provided    Time 4    Period Weeks    Status Achieved    Target Date 07/31/20      PT SHORT TERM GOAL #2   Title Pt will have improved bilat LE strength to at least 4/5    Baseline Grossly 3+/5 bilaterally    Time 4    Period Weeks    Status On-going    Target Date 07/31/20      PT SHORT TERM GOAL #3   Title Pt will be able to tolerate standing at least 10 minutes with pain <4/10    Baseline Able to stand 5 minutes    Time 4    Period Weeks    Status On-going    Target Date 07/31/20      PT SHORT TERM GOAL #4   Title Pt will be able to demonstrate proper body mechanics to lift at least 10 lbs for groceries    Baseline Unable without pain    Time 4    Period Weeks    Status On-going    Target Date 07/31/20             PT Long Term Goals - 07/03/20 1200      PT LONG TERM GOAL #1   Title Pt will be independent with advanced HEP    Time 8    Period Weeks    Status New    Target Date 08/28/20      PT LONG TERM GOAL #2   Title Pt will be able to walk at least 20 minutes with pain </= 4/10    Baseline Tolerates ~10 min    Time 8    Period Weeks    Status New    Target Date 08/28/20      PT LONG TERM GOAL #3   Title Pt will be able to demonstrate no trunk flexion during standing tasks for at least 10 minutes    Baseline Pt stands with ~20 deg of trunk flexion    Time 8    Period Weeks    Status New  Target Date 08/28/20      PT LONG TERM GOAL #4   Title Pt will demonstrate improved bilat LE strength by squatting with at least 25 lbs    Baseline Unable     Time 8    Period Weeks    Status New    Target Date 08/28/20      PT LONG TERM GOAL #5   Title Pt will have improved FOTO score to at least 57%    Baseline FOTO 75%    Time 8    Period Weeks    Status New    Target Date 08/28/20                 Plan - 08/06/20 1233    Clinical Impression Statement Treatment continued on progressing pt's exercises into standing positions. Stretches and manual therapy provided for pt's hip mobility. Pt tolerated treatment well with no issues.    Personal Factors and Comorbidities Fitness;Past/Current Experience;Comorbidity 1;Comorbidity 2;Time since onset of injury/illness/exacerbation    Comorbidities Morbid obesity, OA, chronic knee pain (R worse than L)    Examination-Activity Limitations Bed Mobility;Bend;Caring for Others;Carry;Dressing;Hygiene/Grooming;Lift;Locomotion Level;Sit;Squat;Stairs;Stand;Transfers    Examination-Participation Restrictions Cleaning;Community Activity;Laundry;Meal Prep;Shop    PT Treatment/Interventions ADLs/Self Care Home Management;Biofeedback;Cryotherapy;Electrical Stimulation;Iontophoresis 4mg /ml Dexamethasone;Moist Heat;Traction;Ultrasound;Gait training;Stair training;Functional mobility training;Therapeutic activities;Therapeutic exercise;Balance training;Neuromuscular re-education;Patient/family education;Manual techniques;Passive range of motion;Dry needling;Taping;Vasopneumatic Device;Spinal Manipulations;Joint Manipulations    PT Next Visit Plan cont to progress core & hip strengthening, manual PRN. Continue hip mobs as needed. Progress pt to lifting    PT Home Exercise Plan Access Code: [email or web address]    Consulted and Agree with Plan of Care Patient           Patient will benefit from skilled therapeutic intervention in order to improve the following deficits and impairments:  Decreased range of motion, Difficulty walking, Increased fascial restricitons, Increased muscle spasms, Obesity, Decreased endurance, Pain,  Hypomobility, Impaired flexibility, Improper body mechanics, Decreased mobility, Decreased strength, Postural dysfunction  Visit Diagnosis: Muscle weakness (generalized)  Chronic bilateral low back pain with bilateral sciatica  Abnormal posture  Muscle spasm of back     Problem List Patient Active Problem List   Diagnosis Date Noted  . New onset headache 03/12/2020  . Vaginal candidiasis 03/12/2020  . Prediabetes 11/02/2019  . Hip pain 05/29/2019  . Lymphedema 02/03/2019  . Normocytic anemia 11/12/2018  . Achilles tendonitis, bilateral 01/31/2018  . 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 Family history of colon cancer   . Benign neoplasm of transverse colon   . Plantar fasciitis, bilateral 09/23/2016  . S/P hysterectomy with oophorectomy 12/26/2015  . Carpal tunnel syndrome 11/11/2015  . Eczema 11/11/2015  . Allergic rhinitis 09/23/2015  . Sinusitis 11/21/2014  . Lower leg edema, chronic  10/19/2012  . Routine health maintenance 10/07/2012  . Vertigo 10/07/2012  . Chronic pain syndrome 10/07/2012  . Acute sinusitis 01/20/2011  . Morbid obesity 08/28/2010  . Obstructive sleep apnea on CPAP 06/20/2010  . Diverticulosis 03/26/2010  . Microscopic hematuria 01/13/2010  . GERD 09/05/2009  . Hyperlipidemia with target LDL less than 130 09/25/2008  . Essential hypertension 09/25/2008    [facility] April Ma L Karen Kinnard PT, DPT 08/06/2020, 12:35 PM  [facility] [address] [place], [place], [address] Phone: [phone]   Fax:  [phone]  Name: Amy Jarvis MRN: [id number] Date of Birth: March 15, 1961

## 2020-08-07 ENCOUNTER — Encounter: Payer: Self-pay | Admitting: Internal Medicine

## 2020-08-07 ENCOUNTER — Other Ambulatory Visit: Payer: Self-pay | Admitting: Internal Medicine

## 2020-08-07 ENCOUNTER — Other Ambulatory Visit: Payer: Self-pay | Admitting: *Deleted

## 2020-08-07 DIAGNOSIS — R42 Dizziness and giddiness: Secondary | ICD-10-CM

## 2020-08-07 MED ORDER — LORATADINE 10 MG PO CAPS: 10.0000 mg | ORAL_CAPSULE | Freq: Every day | ORAL | 0 refills | Status: DC

## 2020-08-07 MED ORDER — MECLIZINE HCL 25 MG PO TABS: 25.0000 mg | ORAL_TABLET | Freq: Three times a day (TID) | ORAL | 0 refills | Status: DC | PRN

## 2020-08-07 NOTE — Telephone Encounter (Signed)
Requesting refill on Meclizine and Loratadine.

## 2020-08-08 ENCOUNTER — Ambulatory Visit: Payer: Medicare Other | Admitting: Physical Therapy

## 2020-08-12 ENCOUNTER — Ambulatory Visit: Payer: Medicare Other | Admitting: Physical Therapy

## 2020-08-12 ENCOUNTER — Other Ambulatory Visit: Payer: Self-pay

## 2020-08-12 ENCOUNTER — Encounter: Payer: Self-pay | Admitting: Physical Therapy

## 2020-08-12 DIAGNOSIS — M6281 Muscle weakness (generalized): Secondary | ICD-10-CM

## 2020-08-12 DIAGNOSIS — R293 Abnormal posture: Secondary | ICD-10-CM

## 2020-08-12 DIAGNOSIS — M62830 Muscle spasm of back: Secondary | ICD-10-CM

## 2020-08-12 DIAGNOSIS — M5442 Lumbago with sciatica, left side: Secondary | ICD-10-CM

## 2020-08-12 NOTE — Therapy (Addendum)
[place], [place], [address] Phone: [phone]   Fax:  [phone]  Physical Therapy Treatment and Discharge  Patient Details  Name: Amy Jarvis MRN: [id number] Date of Birth: 22-Feb-1961 Referring Provider (PT): Sid Falcon, MD   Encounter Date: 08/12/2020   PT End of Session - 08/12/20 1218    Visit Number 6    Number of Visits 16    Authorization Type MCR - 10th visit progress note    PT Start Time 1140    PT Stop Time 1225    PT Time Calculation (min) 45 min    Activity Tolerance Patient tolerated treatment well    Behavior During Therapy [facility] for tasks assessed/performed           Past Medical History:  Diagnosis Date  . Allergic rhinitis 09/23/2015  . Carpal tunnel syndrome, bilateral   . Cervical polyp    possible history of cervical polyp  . Complication of anesthesia    low bp after hysterctomy  . Cystocele 01/13/2010  . DDD (degenerative disc disease), lumbar   . Degenerative disk disease 10/07/2012  . Degenerative lumbar spinal stenosis   . Diverticulosis 03/26/2010  . GERD 09/05/2009  . Hyperlipidemia LDL goal < 130 09/25/2008  . Hypertension 09/25/2008  . Microscopic hematuria 01/13/2010  . Morbid obesity 08/28/2010  . Obstructive sleep apnea on CPAP 06/20/2010   cpap   . Plantar fasciitis   . Pneumonia    6/16  . Uterine prolapse 11/19/2008  . Vertigo 10/07/2012    Past Surgical History:  Procedure Laterality Date  . CHOLECYSTECTOMY  2010  . COLONOSCOPY    . COLONOSCOPY WITH PROPOFOL N/A 03/23/2017   Procedure: COLONOSCOPY WITH PROPOFOL;  Surgeon: Ladene Artist, MD;  Location: [facility];  Service: Endoscopy;  Laterality: N/A;  . CYSTOSCOPY N/A 12/26/2015   Procedure: CYSTOSCOPY;  Surgeon: Linda Hedges, DO;  Location: [facility];  Service: Gynecology;  Laterality: N/A;  . HYSTEROSCOPY WITH D & C N/A 01/02/2014   Procedure: DILATATION AND CURETTAGE /HYSTEROSCOPY;   Surgeon: Linda Hedges, DO;  Location: [facility];  Service: Gynecology;  Laterality: N/A;  . LAPAROSCOPIC VAGINAL HYSTERECTOMY WITH SALPINGO OOPHORECTOMY Bilateral 12/26/2015   Procedure: LAPAROSCOPIC ASSISTED VAGINAL HYSTERECTOMY WITH SALPINGO OOPHORECTOMY;  Surgeon: Linda Hedges, DO;  Location: [facility];  Service: Gynecology;  Laterality: Bilateral;  . RECTOCELE REPAIR N/A 12/26/2015   Procedure: POSTERIOR REPAIR (RECTOCELE);  Surgeon: Linda Hedges, DO;  Location: [facility];  Service: Gynecology;  Laterality: N/A;  . ROTATOR CUFF REPAIR     left  . TUBAL LIGATION  1992    There were no vitals filed for this visit.   Subjective Assessment - 08/12/20 1147    Subjective Pt states the exercises have been going okay on days that she can do it. Pt notes that her vertigo has been giving her issues keeping her from exercises    Limitations Walking;House hold activities;Standing;Lifting;Sitting    How long can you sit comfortably? ~20 minutes    How long can you stand comfortably? <10 minutes    How long can you walk comfortably? <10 minutes    Patient Stated Goals Reduce pain and get her back so she can stand up straight.    Currently in Pain? Yes    Pain Score 8  [facility] Treatment/Exercise - 08/12/20 0001      Knee/Hip Exercises: Stretches   Hip Flexor Stretch Both;30 seconds    Piriformis Stretch Right;30 seconds    Other Knee/Hip Stretches quad stretch x 30 sec bilat    Other Knee/Hip Stretches figure 4 stretch x 30 sec      Knee/Hip Exercises: Aerobic   Stationary Bike L1 x 5 min      Knee/Hip Exercises: Supine   Other Supine Knee/Hip Exercises post pelvic tilt x10 progressed to marching 2x 10 bilat    Other Supine Knee/Hip Exercises pball under ankle & roll forward 2x10, roll sideways x10      Manual Therapy   Manual Therapy Joint mobilization    Manual therapy comments Manual stretch of QL bilat    Joint Mobilization anterior hip  glide grade III bilat    Soft tissue mobilization R glute med/min, piriformis, QL                    PT Short Term Goals - 07/29/20 1224      PT SHORT TERM GOAL #1   Title Pt will be independent with initial HEP    Baseline New HEP provided    Time 4    Period Weeks    Status Achieved    Target Date 07/31/20      PT SHORT TERM GOAL #2   Title Pt will have improved bilat LE strength to at least 4/5    Baseline Grossly 3+/5 bilaterally    Time 4    Period Weeks    Status On-going    Target Date 07/31/20      PT SHORT TERM GOAL #3   Title Pt will be able to tolerate standing at least 10 minutes with pain <4/10    Baseline Able to stand 5 minutes    Time 4    Period Weeks    Status On-going    Target Date 07/31/20      PT SHORT TERM GOAL #4   Title Pt will be able to demonstrate proper body mechanics to lift at least 10 lbs for groceries    Baseline Unable without pain    Time 4    Period Weeks    Status On-going    Target Date 07/31/20             PT Long Term Goals - 07/03/20 1200      PT LONG TERM GOAL #1   Title Pt will be independent with advanced HEP    Time 8    Period Weeks    Status New    Target Date 08/28/20      PT LONG TERM GOAL #2   Title Pt will be able to walk at least 20 minutes with pain </= 4/10    Baseline Tolerates ~10 min    Time 8    Period Weeks    Status New    Target Date 08/28/20      PT LONG TERM GOAL #3   Title Pt will be able to demonstrate no trunk flexion during standing tasks for at least 10 minutes    Baseline Pt stands with ~20 deg of trunk flexion    Time 8    Period Weeks    Status New    Target Date 08/28/20      PT LONG TERM GOAL #4   Title Pt will demonstrate improved bilat LE strength by squatting with at  least 25 lbs    Baseline Unable    Time 8    Period Weeks    Status New    Target Date 08/28/20      PT LONG TERM GOAL #5   Title Pt will have improved FOTO score to at least 57%    Baseline  FOTO 75%    Time 8    Period Weeks    Status New    Target Date 08/28/20                 Plan - 08/12/20 1234    Clinical Impression Statement Pt reports increased stiffness after prolonged sitting during the weekend due to exacerbation of her vertigo. Increased stretching and manual therapy provided. Progressed pt's core stabilization exercises in supine. Pt reports she does feel she has been tolerating increased standing; however, pt's FOTO score appears unchanged from initial evaluation.    Personal Factors and Comorbidities Fitness;Past/Current Experience;Comorbidity 1;Comorbidity 2;Time since onset of injury/illness/exacerbation    Comorbidities Morbid obesity, OA, chronic knee pain (R worse than L)    Examination-Activity Limitations Bed Mobility;Bend;Caring for Others;Carry;Dressing;Hygiene/Grooming;Lift;Locomotion Level;Sit;Squat;Stairs;Stand;Transfers    Examination-Participation Restrictions Cleaning;Community Activity;Laundry;Meal Prep;Shop    PT Treatment/Interventions ADLs/Self Care Home Management;Biofeedback;Cryotherapy;Electrical Stimulation;Iontophoresis 32m/ml Dexamethasone;Moist Heat;Traction;Ultrasound;Gait training;Stair training;Functional mobility training;Therapeutic activities;Therapeutic exercise;Balance training;Neuromuscular re-education;Patient/family education;Manual techniques;Passive range of motion;Dry needling;Taping;Vasopneumatic Device;Spinal Manipulations;Joint Manipulations    PT Next Visit Plan cont to progress core & hip strengthening, manual PRN. Continue hip mobs as needed. Progress pt to lifting    PT Home Exercise Plan Access Code: [email or web address]   Consulted and Agree with Plan of Care Patient           PHYSICAL THERAPY DISCHARGE SUMMARY  Visits from Start of Care: 6  Current functional level related to goals / functional outcomes: See above   Remaining deficits: See above   Education / Equipment: See above  Plan:                                                     Patient goals were partially met. Patient is being discharged due to not returning since the last visit.  ?????        Patient will benefit from skilled therapeutic intervention in order to improve the following deficits and impairments:  Decreased range of motion, Difficulty walking, Increased fascial restricitons, Increased muscle spasms, Obesity, Decreased endurance, Pain, Hypomobility, Impaired flexibility, Improper body mechanics, Decreased mobility, Decreased strength, Postural dysfunction  Visit Diagnosis: Muscle weakness (generalized)  Chronic bilateral low back pain with bilateral sciatica  Abnormal posture  Muscle spasm of back     Problem List Patient Active Problem List   Diagnosis Date Noted  . New onset headache 03/12/2020  . Vaginal candidiasis 03/12/2020  . Prediabetes 11/02/2019  . Hip pain 05/29/2019  . Lymphedema 02/03/2019  . Normocytic anemia 11/12/2018  . Achilles tendonitis, bilateral 01/31/2018  . 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 Family history of colon cancer   . Benign neoplasm of transverse colon   . Plantar fasciitis, bilateral 09/23/2016  . S/P hysterectomy with oophorectomy 12/26/2015  . Carpal tunnel syndrome 11/11/2015  . Eczema 11/11/2015  . Allergic rhinitis 09/23/2015  . Sinusitis 11/21/2014  . Lower leg edema, chronic  10/19/2012  .  Routine health maintenance 10/07/2012  . Vertigo 10/07/2012  . Chronic pain syndrome 10/07/2012  . Acute sinusitis 01/20/2011  . Morbid obesity 08/28/2010  . Obstructive sleep apnea on CPAP 06/20/2010  . Diverticulosis 03/26/2010  . Microscopic hematuria 01/13/2010  . GERD 09/05/2009  . Hyperlipidemia with target LDL less than 130 09/25/2008  . Essential hypertension 09/25/2008    [facility] April Ma L Jacobo Moncrief PT, DPT 08/12/2020, 12:39 PM  [facility] [address] [place], [place], [address] Phone: [phone]   Fax:  [phone]  Name: MICHAEL VENTRESCA MRN: [id number] Date of Birth: [birth year]

## 2020-08-14 ENCOUNTER — Ambulatory Visit: Payer: Medicare Other | Admitting: Physical Therapy

## 2020-08-19 ENCOUNTER — Ambulatory Visit: Payer: Medicare Other | Admitting: Physical Therapy

## 2020-08-21 ENCOUNTER — Ambulatory Visit: Payer: Medicare Other | Admitting: Physical Therapy

## 2020-08-28 NOTE — Addendum Note (Signed)
Addended by: Hulan Fray on: 08/28/2020 07:26 PM   Modules accepted: Orders

## 2020-09-05 ENCOUNTER — Other Ambulatory Visit: Payer: Self-pay

## 2020-09-05 ENCOUNTER — Encounter: Payer: Self-pay | Admitting: Physical Therapy

## 2020-09-05 DIAGNOSIS — R7303 Prediabetes: Secondary | ICD-10-CM

## 2020-09-05 MED ORDER — VICTOZA 18 MG/3ML ~~LOC~~ SOPN: 1.8000 mg | PEN_INJECTOR | Freq: Every day | SUBCUTANEOUS | 3 refills | Status: DC

## 2020-09-27 ENCOUNTER — Encounter: Payer: Self-pay | Admitting: Internal Medicine

## 2020-09-27 ENCOUNTER — Other Ambulatory Visit: Payer: Self-pay | Admitting: Internal Medicine

## 2020-09-27 DIAGNOSIS — I10 Essential (primary) hypertension: Secondary | ICD-10-CM

## 2020-09-27 MED ORDER — AMLODIPINE BESYLATE 10 MG PO TABS: 10.0000 mg | ORAL_TABLET | Freq: Every day | ORAL | 3 refills | Status: DC

## 2020-09-27 NOTE — Telephone Encounter (Signed)
Pt calling back for Refill Request  amLODipine (NORVASC) 10 MG tablet  [id number] - [place], [place] - [address]

## 2020-10-28 DIAGNOSIS — G4733 Obstructive sleep apnea (adult) (pediatric): Secondary | ICD-10-CM | POA: Diagnosis not present

## 2020-11-03 ENCOUNTER — Encounter: Payer: Self-pay | Admitting: Internal Medicine

## 2020-11-03 ENCOUNTER — Other Ambulatory Visit: Payer: Self-pay | Admitting: Internal Medicine

## 2020-11-04 ENCOUNTER — Other Ambulatory Visit: Payer: Self-pay | Admitting: Internal Medicine

## 2020-11-04 DIAGNOSIS — L309 Dermatitis, unspecified: Secondary | ICD-10-CM

## 2020-11-04 DIAGNOSIS — J019 Acute sinusitis, unspecified: Secondary | ICD-10-CM

## 2020-11-04 MED ORDER — LORATADINE 10 MG PO CAPS: 10.0000 mg | ORAL_CAPSULE | Freq: Every day | ORAL | 0 refills | Status: DC

## 2020-11-04 MED ORDER — AZELASTINE HCL 0.1 % NA SOLN: NASAL | 1 refills | Status: DC

## 2020-11-04 MED ORDER — HYDROCORTISONE 2.5 % EX OINT: TOPICAL_OINTMENT | CUTANEOUS | 0 refills | Status: DC

## 2020-11-12 DIAGNOSIS — H524 Presbyopia: Secondary | ICD-10-CM | POA: Diagnosis not present

## 2020-11-12 DIAGNOSIS — H2513 Age-related nuclear cataract, bilateral: Secondary | ICD-10-CM | POA: Diagnosis not present

## 2020-11-12 DIAGNOSIS — H40013 Open angle with borderline findings, low risk, bilateral: Secondary | ICD-10-CM | POA: Diagnosis not present

## 2020-11-12 DIAGNOSIS — H25013 Cortical age-related cataract, bilateral: Secondary | ICD-10-CM | POA: Diagnosis not present

## 2020-12-26 ENCOUNTER — Encounter: Payer: Self-pay | Admitting: Internal Medicine

## 2020-12-27 MED ORDER — IBUPROFEN 800 MG PO TABS: 800.0000 mg | ORAL_TABLET | Freq: Three times a day (TID) | ORAL | 0 refills | Status: DC | PRN

## 2020-12-31 ENCOUNTER — Encounter: Payer: Self-pay | Admitting: Internal Medicine

## 2020-12-31 ENCOUNTER — Other Ambulatory Visit: Payer: Self-pay

## 2020-12-31 DIAGNOSIS — I10 Essential (primary) hypertension: Secondary | ICD-10-CM

## 2021-01-01 ENCOUNTER — Other Ambulatory Visit: Payer: Self-pay | Admitting: Internal Medicine

## 2021-01-01 ENCOUNTER — Other Ambulatory Visit: Payer: Self-pay | Admitting: Student

## 2021-01-01 DIAGNOSIS — I10 Essential (primary) hypertension: Secondary | ICD-10-CM

## 2021-01-01 DIAGNOSIS — J019 Acute sinusitis, unspecified: Secondary | ICD-10-CM

## 2021-01-01 MED ORDER — LOSARTAN POTASSIUM-HCTZ 100-25 MG PO TABS: 1.0000 | ORAL_TABLET | Freq: Every day | ORAL | 0 refills | Status: DC

## 2021-01-02 MED ORDER — POTASSIUM CHLORIDE CRYS ER 20 MEQ PO TBCR: EXTENDED_RELEASE_TABLET | ORAL | 0 refills | Status: DC

## 2021-01-08 ENCOUNTER — Other Ambulatory Visit: Payer: Self-pay | Admitting: Internal Medicine

## 2021-01-08 ENCOUNTER — Telehealth: Payer: Self-pay | Admitting: *Deleted

## 2021-01-08 ENCOUNTER — Encounter: Payer: Self-pay | Admitting: Internal Medicine

## 2021-01-08 ENCOUNTER — Other Ambulatory Visit: Payer: Self-pay

## 2021-01-08 MED ORDER — OMEPRAZOLE 40 MG PO CPDR: DELAYED_RELEASE_CAPSULE | ORAL | 1 refills | Status: DC

## 2021-01-08 NOTE — Telephone Encounter (Signed)
Fax from [facility] requesting 90 day supply of losartan-HCTZ 100-25 mg tabs. Please resend Rx if appropriate. Hubbard Hartshorn, BSN, RN-BC

## 2021-01-08 NOTE — Telephone Encounter (Signed)
Fax sent back to [facility] with denial of 90 day request as patient needs reevaluation of hypertension. Hubbard Hartshorn, BSN, RN-BC

## 2021-01-08 NOTE — Telephone Encounter (Signed)
Hello, I refilled it on 01/01/2021 for 30 days only. She needs an OV for re-evaluation of her HTN.

## 2021-01-08 NOTE — Telephone Encounter (Signed)
pt is requesting her omeprazole (PRILOSEC) 40 MG capsule sent to  [facility] [place], [address] AT [place] Phone:  [phone]  Fax:  [phone]      ( pt stated that she has been out for a week now )

## 2021-02-04 ENCOUNTER — Other Ambulatory Visit: Payer: Self-pay | Admitting: *Deleted

## 2021-02-04 DIAGNOSIS — R7303 Prediabetes: Secondary | ICD-10-CM

## 2021-02-04 MED ORDER — VICTOZA 18 MG/3ML ~~LOC~~ SOPN: 1.8000 mg | PEN_INJECTOR | Freq: Every day | SUBCUTANEOUS | 3 refills | Status: DC

## 2021-02-06 DIAGNOSIS — M25561 Pain in right knee: Secondary | ICD-10-CM | POA: Diagnosis not present

## 2021-02-06 DIAGNOSIS — M1711 Unilateral primary osteoarthritis, right knee: Secondary | ICD-10-CM | POA: Diagnosis not present

## 2021-02-19 DIAGNOSIS — G4733 Obstructive sleep apnea (adult) (pediatric): Secondary | ICD-10-CM | POA: Diagnosis not present

## 2021-03-06 ENCOUNTER — Telehealth: Payer: Self-pay | Admitting: *Deleted

## 2021-03-06 DIAGNOSIS — M722 Plantar fascial fibromatosis: Secondary | ICD-10-CM

## 2021-03-06 NOTE — Telephone Encounter (Signed)
Fax from [facility] -stated pt would like a rx for Diclofenac gel; insurance will pay for it if it is filled as a rx  Thanks

## 2021-03-07 MED ORDER — DICLOFENAC SODIUM 1 % EX GEL: 2.0000 g | Freq: Four times a day (QID) | CUTANEOUS | 0 refills | Status: DC

## 2021-03-07 NOTE — Telephone Encounter (Signed)
Will send, thank you

## 2021-03-10 ENCOUNTER — Encounter: Payer: Self-pay | Admitting: *Deleted

## 2021-03-10 NOTE — Progress Notes (Unsigned)

## 2021-03-11 ENCOUNTER — Other Ambulatory Visit: Payer: Self-pay | Admitting: *Deleted

## 2021-03-11 ENCOUNTER — Telehealth: Payer: Self-pay | Admitting: Internal Medicine

## 2021-03-11 ENCOUNTER — Other Ambulatory Visit: Payer: Self-pay | Admitting: Student

## 2021-03-11 DIAGNOSIS — I10 Essential (primary) hypertension: Secondary | ICD-10-CM

## 2021-03-11 MED ORDER — LOSARTAN POTASSIUM-HCTZ 100-25 MG PO TABS: 1.0000 | ORAL_TABLET | Freq: Every day | ORAL | 0 refills | Status: DC

## 2021-03-11 NOTE — Telephone Encounter (Signed)
Things That May Be Affecting Your Health:  Alcohol  Hearing loss  Pain    Depression  Home Safety  Sexual Health   Diabetes Y Lack of physical activity  Stress   Difficulty with daily activities  Loneliness  Tiredness   Drug use Y Medicines  Tobacco use   Falls  Motor Vehicle Safety Y Weight   Food choices  Oral Health  Other    YOUR PERSONALIZED HEALTH PLAN : 1. Schedule your next subsequent Medicare Wellness visit in one year 2. Attend all of your regular appointments to address your medical issues 3. Complete the preventative screenings and services   Annual Wellness Visit   Medicare Covered Preventative Screenings and [place] Men and Women Who How Often Need? Date of Last Service Action  Abdominal Aortic Aneurysm Adults with AAA risk factors Once  @HMIMMADMINDATE ([id number])@    Alcohol Misuse and Counseling All Adults Screening once a year if no alcohol misuse. Counseling up to 4 face to face sessions.     Bone Density Measurement  Adults at risk for osteoporosis Once every 2 yrs Y @HMIMMADMINDATE ([id number])@    Lipid Panel Z13.6 All adults without CV disease Once every 5 yrs Y @HMIMMADMINDATE ([id number])@     Colorectal Cancer   Stool sample or  Colonoscopy All adults 19 and older   Once every year  Every 96 years Y @HMIMMADMINDATE ([id number])@ @HMIMMADMINDATE ([id number])@ @HMIMMADMINDATE ([id number])@    Depression All Adults Once a year Y Today   Diabetes Screening Blood glucose, post glucose load, or GTT Z13.1  All adults at risk  Pre-diabetics  Once per year  Twice per year Y @HMIMMADMINDATE ([id number])@    Diabetes  Self-Management Training All adults Diabetics 10 hrs first year; 2 hours subsequent years. Requires Copay     Glaucoma  Diabetics  Family history of glaucoma  African Americans 28 yrs +  Hispanic Americans 54 yrs + Annually - requires coppay  @HMIMMADMINDATE ([phone])@    Hepatitis C Z72.89 or F19.20  High  Risk for HCV  Born between 1945 and 1965  Annually  Once      HIV Z11.4 All adults based on risk  Annually btw ages 2 & 67 regardless of risk  Annually > 65 yrs if at increased risk      Lung Cancer Screening Asymptomatic adults aged 41-77 with 30 pack yr history and current smoker OR quit within the last 15 yrs Annually Must have counseling and shared decision making documentation before first screen      Medical Nutrition Therapy Adults with   Diabetes  Renal disease  Kidney transplant within past 3 yrs 3 hours first year; 2 hours subsequent years     Obesity and Counseling All adults Screening once a year Counseling if BMI 30 or higher Y Today   Tobacco Use Counseling Adults who use tobacco  Up to 8 visits in one year     Vaccines Z23  Hepatitis B  Influenza   Pneumonia  Adults   Once  Once every flu season  Two different vaccines separated by one year     Next Annual Wellness Visit People with Medicare Every year  Today     Services & Screenings Women Who How Often Need  Date of Last Service Action  Mammogram  Z12.31 Women over 58 One baseline ages 40-39. Annually ager 40 yrs+      Pap tests All women Annually if high risk. Every 2 yrs for normal risk women  Screening for cervical cancer with   Pap (Z01.419 nl or Z01.411abnl) &  HPV Z11.51 Women aged 41 to 14 Once every 5 yrs     Screening pelvic and breast exams All women Annually if high risk. Every 2 yrs for normal risk women     Sexually Transmitted Diseases  Chlamydia  Gonorrhea  Syphilis All at risk adults Annually for non pregnant females at increased risk         [place] Men Who How Ofter Need  Date of Last Service Action  Prostate Cancer - DRE & PSA Men over 50 Annually.  DRE might require a copay.        Sexually Transmitted Diseases  Syphilis All at risk adults Annually for men at increased risk      Health Maintenance List Health Maintenance  Topic  Date Due  . COVID-19 Vaccine (1) Never done  . INFLUENZA VACCINE  06/30/2021  . COLONOSCOPY (Pts 45-66yrs Insurance coverage will need to be confirmed)  03/23/2022  . MAMMOGRAM  05/03/2022  . TETANUS/TDAP  11/11/2028  . Hepatitis C Screening  Completed  . HIV Screening  Completed  . HPV VACCINES  Aged Out  . PAP SMEAR-Modifier  Discontinued

## 2021-03-19 ENCOUNTER — Ambulatory Visit (INDEPENDENT_AMBULATORY_CARE_PROVIDER_SITE_OTHER): Payer: Medicare Other | Admitting: Internal Medicine

## 2021-03-19 ENCOUNTER — Encounter: Payer: Self-pay | Admitting: Internal Medicine

## 2021-03-19 ENCOUNTER — Other Ambulatory Visit: Payer: Self-pay

## 2021-03-19 VITALS — BP 152/71 | HR 93 | Temp 98.0°F | Ht 66.0 in | Wt 337.4 lb

## 2021-03-19 DIAGNOSIS — R42 Dizziness and giddiness: Secondary | ICD-10-CM

## 2021-03-19 DIAGNOSIS — I10 Essential (primary) hypertension: Secondary | ICD-10-CM

## 2021-03-19 DIAGNOSIS — Z0000 Encounter for general adult medical examination without abnormal findings: Secondary | ICD-10-CM

## 2021-03-19 DIAGNOSIS — J309 Allergic rhinitis, unspecified: Secondary | ICD-10-CM | POA: Diagnosis not present

## 2021-03-19 DIAGNOSIS — R7303 Prediabetes: Secondary | ICD-10-CM | POA: Diagnosis not present

## 2021-03-19 DIAGNOSIS — M170 Bilateral primary osteoarthritis of knee: Secondary | ICD-10-CM | POA: Diagnosis not present

## 2021-03-19 DIAGNOSIS — Z6841 Body Mass Index (BMI) 40.0 and over, adult: Secondary | ICD-10-CM

## 2021-03-19 DIAGNOSIS — E785 Hyperlipidemia, unspecified: Secondary | ICD-10-CM | POA: Diagnosis not present

## 2021-03-19 DIAGNOSIS — Z23 Encounter for immunization: Secondary | ICD-10-CM | POA: Diagnosis not present

## 2021-03-19 DIAGNOSIS — J019 Acute sinusitis, unspecified: Secondary | ICD-10-CM | POA: Diagnosis not present

## 2021-03-19 LAB — POCT GLYCOSYLATED HEMOGLOBIN (HGB A1C): Hemoglobin A1C: 6 % — AB (ref 4.0–5.6)

## 2021-03-19 LAB — GLUCOSE, CAPILLARY: Glucose-Capillary: 90 mg/dL (ref 70–99)

## 2021-03-19 MED ORDER — MECLIZINE HCL 25 MG PO TABS: 25.0000 mg | ORAL_TABLET | Freq: Three times a day (TID) | ORAL | 0 refills | Status: DC | PRN

## 2021-03-19 MED ORDER — MELOXICAM 5 MG PO CAPS: 5.0000 mg | ORAL_CAPSULE | Freq: Every day | ORAL | 0 refills | Status: DC | PRN

## 2021-03-19 MED ORDER — LORATADINE 10 MG PO CAPS: 10.0000 mg | ORAL_CAPSULE | Freq: Every day | ORAL | 3 refills | Status: DC

## 2021-03-19 NOTE — Progress Notes (Signed)
   CC: hypertension f/u  HPI:  [name] is a 60 y.o. female with PMHx as stated below presenting for hypertension follow up. She denies any acute concerns at this time. Please see problem based charting for complete assessment and plan.  Past Medical History:  Diagnosis Date  . Acute sinusitis 01/20/2011   Qualifier: Diagnosis of  By: Alvy Beal    . Allergic rhinitis 09/23/2015  . Carpal tunnel syndrome, bilateral   . Cervical polyp    possible history of cervical polyp  . Complication of anesthesia    low bp after hysterctomy  . Cystocele 01/13/2010  . DDD (degenerative disc disease), lumbar   . Degenerative disk disease 10/07/2012  . Degenerative lumbar spinal stenosis   . Diverticulosis 03/26/2010  . GERD 09/05/2009  . Hyperlipidemia LDL goal < 130 09/25/2008  . Hypertension 09/25/2008  . Microscopic hematuria 01/13/2010  . Morbid obesity 08/28/2010  . Obstructive sleep apnea on CPAP 06/20/2010   cpap   . Plantar fasciitis   . Plantar fasciitis, bilateral 09/23/2016  . Pneumonia    6/16  . Uterine prolapse 11/19/2008  . Vertigo 10/07/2012   Review of Systems:  Negative except as stated in HPI.  Physical Exam:  Vitals:   03/19/21 0936  BP: (!) 165/91  Pulse: 93  Temp: 98 F (36.7 C)  SpO2: 100%  Weight: (!) 337 lb 6.4 oz (153 kg)  Height: 5\' 6"  (1.676 m)   Physical Exam  Constitutional: Obese female. No distress.  HENT: Normocephalic and atraumatic, EOMI Cardiovascular: Normal rate, regular rhythm, S1 and S2 present, no murmurs, rubs, gallops.  Distal pulses intact Respiratory: No respiratory distress,  Effort is normal.  Lungs are clear to auscultation bilaterally. Musculoskeletal: Normal bulk and tone.  No peripheral edema noted. Neurological: Is alert and oriented x4, no apparent focal deficits noted. Skin: Warm and dry.  No rash, erythema, lesions noted. Psychiatric: Normal mood and affect. Behavior is normal. Judgment and thought content  normal.   Assessment & Plan:   See Encounters Tab for problem based charting.  Patient discussed with Dr. Jimmye Norman

## 2021-03-19 NOTE — Patient Instructions (Addendum)
Amy Jarvis,  It was a pleasure seeing you in clinic. Today we discussed:   Hypertension: At this time, please continue to take all medications as prescribed. As we discussed, the oral NSAIDs may be contributing to your elevated BP. Please try to use the topical NSAIDs as much as possible to decrease the amount of oral NSAIDs you are taking.  I am checking on some lab work today. I will call you with any abnormal results.  I am also sending a referral to the weight management clinic.   If you have any questions or concerns, please call our clinic at [phone] between 9am-5pm and after hours call [phone] and ask for the internal medicine resident on call. If you feel you are having a medical emergency please call 911.   Thank you, we look forward to helping you remain healthy!   Journal for Nurse Practitioners, 15(4), [phone]. Retrieved September 05, 2018 from [email or web address].com/nursing">  Knee Exercises Ask your health care provider which exercises are safe for you. Do exercises exactly as told by your health care provider and adjust them as directed. It is normal to feel mild stretching, pulling, tightness, or discomfort as you do these exercises. Stop right away if you feel sudden pain or your pain gets worse. Do not begin these exercises until told by your health care provider. Stretching and range-of-motion exercises These exercises warm up your muscles and joints and improve the movement and flexibility of your knee. These exercises also help to relieve pain and swelling. Knee extension, prone 1. Lie on your abdomen (prone position) on a bed. 2. Place your left / right knee just beyond the edge of the surface so your knee is not on the bed. You can put a towel under your left / right thigh just above your kneecap for comfort. 3. Relax your leg muscles and allow gravity to straighten your knee (extension). You should feel a stretch behind your left / right knee. 4. Hold this  position for __________ seconds. 5. Scoot up so your knee is supported between repetitions. Repeat __________ times. Complete this exercise __________ times a day. Knee flexion, active 1. Lie on your back with both legs straight. If this causes back discomfort, bend your left / right knee so your foot is flat on the floor. 2. Slowly slide your left / right heel back toward your buttocks. Stop when you feel a gentle stretch in the front of your knee or thigh (flexion). 3. Hold this position for __________ seconds. 4. Slowly slide your left / right heel back to the starting position. Repeat __________ times. Complete this exercise __________ times a day.   Quadriceps stretch, prone 1. Lie on your abdomen on a firm surface, such as a bed or padded floor. 2. Bend your left / right knee and hold your ankle. If you cannot reach your ankle or pant leg, loop a belt around your foot and grab the belt instead. 3. Gently pull your heel toward your buttocks. Your knee should not slide out to the side. You should feel a stretch in the front of your thigh and knee (quadriceps). 4. Hold this position for __________ seconds. Repeat __________ times. Complete this exercise __________ times a day.   Hamstring, supine 1. Lie on your back (supine position). 2. Loop a belt or towel over the ball of your left / right foot. The ball of your foot is on the walking surface, right under your toes. 3. Straighten your left / right knee  and slowly pull on the belt to raise your leg until you feel a gentle stretch behind your knee (hamstring). ? Do not let your knee bend while you do this. ? Keep your other leg flat on the floor. 4. Hold this position for __________ seconds. Repeat __________ times. Complete this exercise __________ times a day. Strengthening exercises These exercises build strength and endurance in your knee. Endurance is the ability to use your muscles for a long time, even after they get  tired. Quadriceps, isometric This exercise stretches the muscles in front of your thigh (quadriceps) without moving your knee joint (isometric). 1. Lie on your back with your left / right leg extended and your other knee bent. Put a rolled towel or small pillow under your knee if told by your health care provider. 2. Slowly tense the muscles in the front of your left / right thigh. You should see your kneecap slide up toward your hip or see increased dimpling just above the knee. This motion will push the back of the knee toward the floor. 3. For __________ seconds, hold the muscle as tight as you can without increasing your pain. 4. Relax the muscles slowly and completely. Repeat __________ times. Complete this exercise __________ times a day.   Straight leg raises This exercise stretches the muscles in front of your thigh (quadriceps) and the muscles that move your hips (hip flexors). 1. Lie on your back with your left / right leg extended and your other knee bent. 2. Tense the muscles in the front of your left / right thigh. You should see your kneecap slide up or see increased dimpling just above the knee. Your thigh may even shake a bit. 3. Keep these muscles tight as you raise your leg 4-6 inches (10-15 cm) off the floor. Do not let your knee bend. 4. Hold this position for __________ seconds. 5. Keep these muscles tense as you lower your leg. 6. Relax your muscles slowly and completely after each repetition. Repeat __________ times. Complete this exercise __________ times a day. Hamstring, isometric 1. Lie on your back on a firm surface. 2. Bend your left / right knee about __________ degrees. 3. Dig your left / right heel into the surface as if you are trying to pull it toward your buttocks. Tighten the muscles in the back of your thighs (hamstring) to "dig" as hard as you can without increasing any pain. 4. Hold this position for __________ seconds. 5. Release the tension gradually and  allow your muscles to relax completely for __________ seconds after each repetition. Repeat __________ times. Complete this exercise __________ times a day. Hamstring curls If told by your health care provider, do this exercise while wearing ankle weights. Begin with __________ lb weights. Then increase the weight by 1 lb (0.5 kg) increments. Do not wear ankle weights that are more than __________ lb. 1. Lie on your abdomen with your legs straight. 2. Bend your left / right knee as far as you can without feeling pain. Keep your hips flat against the floor. 3. Hold this position for __________ seconds. 4. Slowly lower your leg to the starting position. Repeat __________ times. Complete this exercise __________ times a day.   Squats This exercise strengthens the muscles in front of your thigh and knee (quadriceps). 1. Stand in front of a table, with your feet and knees pointing straight ahead. You may rest your hands on the table for balance but not for support. 2. Slowly bend your  knees and lower your hips like you are going to sit in a chair. ? Keep your weight over your heels, not over your toes. ? Keep your lower legs upright so they are parallel with the table legs. ? Do not let your hips go lower than your knees. ? Do not bend lower than told by your health care provider. ? If your knee pain increases, do not bend as low. 3. Hold the squat position for __________ seconds. 4. Slowly push with your legs to return to standing. Do not use your hands to pull yourself to standing. Repeat __________ times. Complete this exercise __________ times a day. Wall slides This exercise strengthens the muscles in front of your thigh and knee (quadriceps). 1. Lean your back against a smooth wall or door, and walk your feet out 18-24 inches (46-61 cm) from it. 2. Place your feet hip-width apart. 3. Slowly slide down the wall or door until your knees bend __________ degrees. Keep your knees over your  heels, not over your toes. Keep your knees in line with your hips. 4. Hold this position for __________ seconds. Repeat __________ times. Complete this exercise __________ times a day.   Straight leg raises This exercise strengthens the muscles that rotate the leg at the hip and move it away from your body (hip abductors). 1. Lie on your side with your left / right leg in the top position. Lie so your head, shoulder, knee, and hip line up. You may bend your bottom knee to help you keep your balance. 2. Roll your hips slightly forward so your hips are stacked directly over each other and your left / right knee is facing forward. 3. Leading with your heel, lift your top leg 4-6 inches (10-15 cm). You should feel the muscles in your outer hip lifting. ? Do not let your foot drift forward. ? Do not let your knee roll toward the ceiling. 4. Hold this position for __________ seconds. 5. Slowly return your leg to the starting position. 6. Let your muscles relax completely after each repetition. Repeat __________ times. Complete this exercise __________ times a day.   Straight leg raises This exercise stretches the muscles that move your hips away from the front of the pelvis (hip extensors). 1. Lie on your abdomen on a firm surface. You can put a pillow under your hips if that is more comfortable. 2. Tense the muscles in your buttocks and lift your left / right leg about 4-6 inches (10-15 cm). Keep your knee straight as you lift your leg. 3. Hold this position for __________ seconds. 4. Slowly lower your leg to the starting position. 5. Let your leg relax completely after each repetition. Repeat __________ times. Complete this exercise __________ times a day. This information is not intended to replace advice given to you by your health care provider. Make sure you discuss any questions you have with your health care provider. Document Revised: 09/06/2018 Document Reviewed: 09/06/2018 Elsevier  Patient Education  2021 [employer].

## 2021-03-20 LAB — BMP8+ANION GAP
Anion Gap: 20 mmol/L — ABNORMAL HIGH (ref 10.0–18.0)
BUN/Creatinine Ratio: 18 (ref 12–28)
BUN: 13 mg/dL (ref 8–27)
CO2: 21 mmol/L (ref 20–29)
Calcium: 9.7 mg/dL (ref 8.7–10.3)
Chloride: 101 mmol/L (ref 96–106)
Creatinine, Ser: 0.72 mg/dL (ref 0.57–1.00)
Glucose: 92 mg/dL (ref 65–99)
Potassium: 3.6 mmol/L (ref 3.5–5.2)
Sodium: 142 mmol/L (ref 134–144)
eGFR: 96 mL/min/{1.73_m2} (ref >59)

## 2021-03-20 LAB — LIPID PANEL
Chol/HDL Ratio: 4.9 ratio — ABNORMAL HIGH (ref 0.0–4.4)
Cholesterol, Total: 236 mg/dL — ABNORMAL HIGH (ref 100–199)
HDL: 48 mg/dL (ref >39)
LDL Chol Calc (NIH): 163 mg/dL — ABNORMAL HIGH (ref 0–99)
Triglycerides: 139 mg/dL (ref 0–149)
VLDL Cholesterol Cal: 25 mg/dL (ref 5–40)

## 2021-03-20 NOTE — Assessment & Plan Note (Addendum)
BP Readings from Last 3 Encounters:  03/19/21 (!) 152/71  05/31/20 (!) 145/76  01/30/20 (!) 142/77   Patient's blood pressure elevated at visit with initial 165/91 and repeat 152/71. She is on losartan-hctz 100-25mg  daily and amlodipine 10mg  daily. She does note worsening bilateral knee pain (R>L) for which she has been taking increasing amount of NSAIDs. I advised patient that this could be contributing to her elevated BP and to avoid oral NSAIDs as possible. Patient also advised for weight loss and referred to weight management clinic.   ASCVD risk: 29.8% Patient would benefit from improved blood pressure control to minimize her risk of adverse cardiovascular outcomes.   Plan:  - Sent in Rx to pharmacy for  combination pill of olmesartan-amlodipine-HCTZ 40-10-25mg  daily to decrease pill burden, patient would like to finish out the current prescriptions that she has  - Advised to minimize NSAID use as much as possible  - Discussed starting spironolactone at this time given her increased risk of MI/stroke in setting of uncontrolled hypertension. However, she would like to hold off on this for now and is open to starting at next visit if BP remains elevated.  - F/u in 4 weeks for BP check

## 2021-03-21 ENCOUNTER — Other Ambulatory Visit: Payer: Self-pay | Admitting: Internal Medicine

## 2021-03-21 DIAGNOSIS — M179 Osteoarthritis of knee, unspecified: Secondary | ICD-10-CM | POA: Insufficient documentation

## 2021-03-21 DIAGNOSIS — M1710 Unilateral primary osteoarthritis, unspecified knee: Secondary | ICD-10-CM | POA: Insufficient documentation

## 2021-03-21 DIAGNOSIS — I10 Essential (primary) hypertension: Secondary | ICD-10-CM

## 2021-03-21 MED ORDER — OLMESARTAN-AMLODIPINE-HCTZ 40-10-25 MG PO TABS: 1.0000 | ORAL_TABLET | Freq: Every day | ORAL | 1 refills | Status: DC

## 2021-03-21 MED ORDER — SPIRONOLACTONE 25 MG PO TABS: 25.0000 mg | ORAL_TABLET | Freq: Every day | ORAL | 11 refills | Status: DC

## 2021-03-21 MED ORDER — BLOOD GLUCOSE MONITORING SUPPL DEVI: 1.0000 | Freq: Once | 0 refills | Status: AC

## 2021-03-21 NOTE — Assessment & Plan Note (Signed)
BMI>50. Patient is interested in weight loss. She was previously referred to [facility] and reports that she does try to use the nutrition tips that [facility] provided.  Notes that she is not able to walk much due to her knee pain. Discussed referral to weight management clinic. Patient is agreeable to this but also notes that if this has a large co-pay, she may not be able to afford it.  Plan: Referral to weight management clinic If patient is unable to do this, would have her continue to follow with [facility] and schedule office visits on same day to minimize co-pay

## 2021-03-21 NOTE — Assessment & Plan Note (Signed)
Flu vaccine given during this visit Rx for shingles vaccine to pharmacy  Colonoscopy due in 2023.

## 2021-03-21 NOTE — Assessment & Plan Note (Signed)
Patient has a history of bilateral knee osteoarthritis which flares up occasionally. She notes that previously, this was well controlled with NSAIDs prn and she would only need a few days of NSAID therapy and would be able to go 4-6 months pain-free. However, this has been progressively worsening and now she requires NSAID therapy much more frequently with only slight relief in her knee pain. She reports that she previously tried physical therapy and would like to hold off on this due to high co-pays.  I advised patient for topical NSAIDs such as voltaren gel up to four times daily to minimize amount of oral NSAID she is taking which would put her at increased risk for worsening hypertension and peptic ulcer disease. Also offered intra-articular joint injections which patient declined at this time.   Plan: Voltaren gel qid prn Meloxicam 5mg  daily prn

## 2021-03-21 NOTE — Assessment & Plan Note (Signed)
HbA1c 6.0 at this visit, continued to be in the prediabetes range.  She is still on liraglutide 1.8mg  daily.   Can also consider switching her to ozempic weekly for added weight loss benefits.   Plan Discuss changing insulin regimen to ozempic weekly and titrate up to 2.4mg  weekly for max weight loss benefits

## 2021-03-21 NOTE — Assessment & Plan Note (Signed)
Stable. Refilled loratadine 10mg  daily

## 2021-03-21 NOTE — Assessment & Plan Note (Signed)
Lipid Panel     Component Value Date/Time   CHOL 236 (H) 03/19/2021 1033   TRIG 139 03/19/2021 1033   HDL 48 03/19/2021 1033   CHOLHDL 4.9 (H) 03/19/2021 1033   CHOLHDL 4.6 03/22/2014 1653   VLDL 21 03/22/2014 1653   LDLCALC 163 (H) 03/19/2021 1033   LABVLDL 25 03/19/2021 1033   ASCVD risk 29%. Patient previously unable to tolerate statin therapy. She is on zetia.  Plan: Continue zetia 10mg  daily Advised for lifestyle modifications

## 2021-03-24 NOTE — Progress Notes (Signed)
Internal Medicine Clinic Attending  Case discussed with Dr. Marva Panda  At the time of the visit.  We reviewed the resident's history and exam and pertinent patient test results.  I agree with the assessment, diagnosis, and plan of care documented in the resident's note. Persistently elevated LDL while taking Zetia and Lovaza.  Given her CVD risk as reported > 10%, additional treatment of elevated LDL is warranted and will be further discussed.

## 2021-03-25 ENCOUNTER — Other Ambulatory Visit: Payer: Self-pay | Admitting: Student

## 2021-03-25 ENCOUNTER — Telehealth: Payer: Self-pay | Admitting: *Deleted

## 2021-03-25 DIAGNOSIS — I10 Essential (primary) hypertension: Secondary | ICD-10-CM

## 2021-03-25 NOTE — Telephone Encounter (Signed)
Call to [facility].  Meloxicam 5 mg capsules is not on Pharmacy formulary.  Message to provider to change prescription to tablets which requires  no PA.  . Meloxicam 7.5 mg  is tablets. are vailable without a PA.  Message to [facility] to consider a change.   Sander Nephew, RN

## 2021-04-08 DIAGNOSIS — G4733 Obstructive sleep apnea (adult) (pediatric): Secondary | ICD-10-CM | POA: Diagnosis not present

## 2021-04-21 ENCOUNTER — Encounter: Payer: Medicare Other | Admitting: Internal Medicine

## 2021-04-24 DIAGNOSIS — M170 Bilateral primary osteoarthritis of knee: Secondary | ICD-10-CM | POA: Diagnosis not present

## 2021-04-24 DIAGNOSIS — M1711 Unilateral primary osteoarthritis, right knee: Secondary | ICD-10-CM | POA: Diagnosis not present

## 2021-04-30 ENCOUNTER — Telehealth: Payer: Self-pay | Admitting: Orthopedic Surgery

## 2021-04-30 NOTE — Telephone Encounter (Signed)
Received call from patient. She would like her records be sent to another doctor. She will come in and sign release form. [phone]

## 2021-06-03 ENCOUNTER — Encounter (INDEPENDENT_AMBULATORY_CARE_PROVIDER_SITE_OTHER): Payer: Self-pay

## 2021-06-03 ENCOUNTER — Encounter: Payer: Self-pay | Admitting: *Deleted

## 2021-06-03 ENCOUNTER — Ambulatory Visit (INDEPENDENT_AMBULATORY_CARE_PROVIDER_SITE_OTHER): Payer: Medicare Other | Admitting: Family Medicine

## 2021-06-17 ENCOUNTER — Ambulatory Visit (INDEPENDENT_AMBULATORY_CARE_PROVIDER_SITE_OTHER): Payer: Medicare Other | Admitting: Family Medicine

## 2021-06-26 ENCOUNTER — Other Ambulatory Visit: Payer: Self-pay | Admitting: *Deleted

## 2021-06-26 ENCOUNTER — Encounter: Payer: Self-pay | Admitting: Internal Medicine

## 2021-06-27 MED ORDER — OMEPRAZOLE 40 MG PO CPDR: DELAYED_RELEASE_CAPSULE | ORAL | 1 refills | Status: DC

## 2021-07-03 MED ORDER — ACCU-CHEK GUIDE VI STRP: ORAL_STRIP | 12 refills | Status: DC

## 2021-07-03 MED ORDER — GLUCOSE BLOOD VI STRP: ORAL_STRIP | 12 refills | Status: DC

## 2021-07-03 MED ORDER — ACCU-CHEK SOFTCLIX LANCETS MISC
12 refills | Status: AC
Start: 2021-07-03 — End: ?

## 2021-07-08 ENCOUNTER — Other Ambulatory Visit: Payer: Self-pay | Admitting: Student

## 2021-07-08 DIAGNOSIS — G4733 Obstructive sleep apnea (adult) (pediatric): Secondary | ICD-10-CM | POA: Diagnosis not present

## 2021-07-08 DIAGNOSIS — R7303 Prediabetes: Secondary | ICD-10-CM

## 2021-07-14 NOTE — Addendum Note (Signed)
Addended by: Hulan Fray on: 07/14/2021 06:17 PM   Modules accepted: Orders

## 2021-08-10 DIAGNOSIS — H61892 Other specified disorders of left external ear: Secondary | ICD-10-CM | POA: Diagnosis not present

## 2021-08-10 DIAGNOSIS — H6121 Impacted cerumen, right ear: Secondary | ICD-10-CM | POA: Diagnosis not present

## 2021-08-20 ENCOUNTER — Other Ambulatory Visit: Payer: Self-pay | Admitting: Internal Medicine

## 2021-08-20 ENCOUNTER — Other Ambulatory Visit: Payer: Self-pay

## 2021-08-20 DIAGNOSIS — R42 Dizziness and giddiness: Secondary | ICD-10-CM

## 2021-08-20 DIAGNOSIS — L309 Dermatitis, unspecified: Secondary | ICD-10-CM

## 2021-08-22 MED ORDER — MECLIZINE HCL 25 MG PO TABS: 25.0000 mg | ORAL_TABLET | Freq: Three times a day (TID) | ORAL | 0 refills | Status: DC | PRN

## 2021-08-22 MED ORDER — HYDROCORTISONE 2.5 % EX OINT: TOPICAL_OINTMENT | CUTANEOUS | 0 refills | Status: DC

## 2021-09-03 ENCOUNTER — Encounter: Payer: Self-pay | Admitting: Internal Medicine

## 2021-09-08 ENCOUNTER — Encounter: Payer: Self-pay | Admitting: Internal Medicine

## 2021-09-08 DIAGNOSIS — J019 Acute sinusitis, unspecified: Secondary | ICD-10-CM

## 2021-09-09 MED ORDER — AZELASTINE HCL 0.1 % NA SOLN: NASAL | 1 refills | Status: DC

## 2021-09-10 ENCOUNTER — Other Ambulatory Visit: Payer: Self-pay | Admitting: Student in an Organized Health Care Education/Training Program

## 2021-09-10 DIAGNOSIS — Z1231 Encounter for screening mammogram for malignant neoplasm of breast: Secondary | ICD-10-CM

## 2021-09-18 ENCOUNTER — Encounter: Payer: Self-pay | Admitting: Pharmacist

## 2021-09-18 ENCOUNTER — Ambulatory Visit (INDEPENDENT_AMBULATORY_CARE_PROVIDER_SITE_OTHER): Payer: Medicare Other | Admitting: Pharmacist

## 2021-09-18 VITALS — Wt 330.0 lb

## 2021-09-18 DIAGNOSIS — Z0000 Encounter for general adult medical examination without abnormal findings: Secondary | ICD-10-CM | POA: Diagnosis not present

## 2021-09-18 NOTE — Patient Instructions (Addendum)
Annual Wellness Visit   Medicare Covered Preventative Screenings and Services  Services & Screenings Men and Women Who How Often Need? Date of Last Service Action  Abdominal Aortic Aneurysm Adults with AAA risk factors Once     Alcohol Misuse and Counseling All Adults Screening once a year if no alcohol misuse. Counseling up to 4 face to face sessions.     Bone Density Measurement  Adults at risk for osteoporosis Once every 2 yrs     Lipid Panel Z13.6 All adults without CV disease Once every 5 yrs     Colorectal Cancer  Stool sample or Colonoscopy All adults 35 and older  Once every year Every 10 years     Depression All Adults Once a year  Today   Diabetes Screening Blood glucose, post glucose load, or GTT Z13.1 All adults at risk Pre-diabetics Once per year Twice per year     Diabetes  Self-Management Training All adults Diabetics 10 hrs first year; 2 hours subsequent years. Requires Copay     Glaucoma Diabetics Family history of glaucoma African Americans 101 yrs + Hispanic Americans 60 yrs + Annually - requires coppay     Hepatitis C Z72.89 or F19.20 High Risk for HCV Born between 1945 and 1965 Annually Once     HIV Z11.4 All adults based on risk Annually btw ages 4 & 16 regardless of risk Annually > 65 yrs if at increased risk     Lung Cancer Screening Asymptomatic adults aged 62-77 with 30 pack yr history and current smoker OR quit within the last 15 yrs Annually Must have counseling and shared decision making documentation before first screen     Medical Nutrition Therapy Adults with  Diabetes Renal disease Kidney transplant within past 3 yrs 3 hours first year; 2 hours subsequent years     Obesity and Counseling All adults Screening once a year Counseling if BMI 30 or higher  Today   Tobacco Use Counseling Adults who use tobacco  Up to 8 visits in one year     Vaccines Z23 Hepatitis B Influenza  Pneumonia  Adults  Once Once every flu season Two different  vaccines separated by one year     Next Annual Wellness Visit People with Medicare Every year  Today     Services & Screenings Women Who How Often Need  Date of Last Service Action  Mammogram  Z12.31 Women over 50 One baseline ages 67-39. Annually ager 40 yrs+     Pap tests All women Annually if high risk. Every 2 yrs for normal risk women     Screening for cervical cancer with  Pap (Z01.419 nl or Z01.411abnl) & HPV Z11.51 Women aged 32 to 82 Once every 5 yrs     Screening pelvic and breast exams All women Annually if high risk. Every 2 yrs for normal risk women     Sexually Transmitted Diseases Chlamydia Gonorrhea Syphilis All at risk adults Annually for non pregnant females at increased risk         [place] Men Who How Ofter Need  Date of Last Service Action  Prostate Cancer - DRE & PSA Men over 50 Annually.  DRE might require a copay.     Sexually Transmitted Diseases Syphilis All at risk adults Annually for men at increased risk         Things That May Be Affecting Your Health:  Alcohol  Hearing loss X Pain    Depression  Home  Safety  Sexual Health   Diabetes X Lack of physical activity  Stress   Difficulty with daily activities  Loneliness  Tiredness   Drug use  Medicines  Tobacco use   Falls  Motor Vehicle Safety X Weight   Food choices  Oral Health  Other    YOUR PERSONALIZED HEALTH PLAN : 1. Schedule your next subsequent Medicare Wellness visit in one year 2. Attend all of your regular appointments to address your medical issues 3. Complete the preventative screenings and services 4. You may go to any local pharmacy to obtain your Shingles vaccine. Please bring in documentation if you do so therefore we can update your records. 5. You may obtain the flu shot at your appointment next week  Fall Prevention in the Home, Adult Falls can cause injuries and can happen to people of all ages. There are many things you can do to make your home safe and  to help prevent falls. Ask for help when making these changes. What actions can I take to prevent falls? General Instructions Use good lighting in all rooms. Replace any light bulbs that burn out. Turn on the lights in dark areas. Use night-lights. Keep items that you use often in easy-to-reach places. Lower the shelves around your home if needed. Set up your furniture so you have a clear path. Avoid moving your furniture around. Do not have throw rugs or other things on the floor that can make you trip. Avoid walking on wet floors. If any of your floors are uneven, fix them. Add color or contrast paint or tape to clearly mark and help you see: Grab bars or handrails. First and last steps of staircases. Where the edge of each step is. If you use a stepladder: Make sure that it is fully opened. Do not climb a closed stepladder. Make sure the sides of the stepladder are locked in place. Ask someone to hold the stepladder while you use it. Know where your pets are when moving through your home. What can I do in the bathroom?   Keep the floor dry. Clean up any water on the floor right away. Remove soap buildup in the tub or shower. Use nonskid mats or decals on the floor of the tub or shower. Attach bath mats securely with double-sided, nonslip rug tape. If you need to sit down in the shower, use a plastic, nonslip stool. Install grab bars by the toilet and in the tub and shower. Do not use towel bars as grab bars. What can I do in the bedroom? Make sure that you have a light by your bed that is easy to reach. Do not use any sheets or blankets for your bed that hang to the floor. Have a firm chair with side arms that you can use for support when you get dressed. What can I do in the kitchen? Clean up any spills right away. If you need to reach something above you, use a step stool with a grab bar. Keep electrical cords out of the way. Do not use floor polish or wax that makes floors  slippery. What can I do with my stairs? Do not leave any items on the stairs. Make sure that you have a light switch at the top and the bottom of the stairs. Make sure that there are handrails on both sides of the stairs. Fix handrails that are broken or loose. Install nonslip stair treads on all your stairs. Avoid having throw rugs at the top  or bottom of the stairs. Choose a carpet that does not hide the edge of the steps on the stairs. Check carpeting to make sure that it is firmly attached to the stairs. Fix carpet that is loose or worn. What can I do on the outside of my home? Use bright outdoor lighting. Fix the edges of walkways and driveways and fix any cracks. Remove anything that might make you trip as you walk through a door, such as a raised step or threshold. Trim any bushes or trees on paths to your home. Check to see if handrails are loose or broken and that both sides of all steps have handrails. Install guardrails along the edges of any raised decks and porches. Clear paths of anything that can make you trip, such as tools or rocks. Have leaves, snow, or ice cleared regularly. Use sand or salt on paths during winter. Clean up any spills in your garage right away. This includes grease or oil spills. What other actions can I take? Wear shoes that: Have a low heel. Do not wear high heels. Have rubber bottoms. Feel good on your feet and fit well. Are closed at the toe. Do not wear open-toe sandals. Use tools that help you move around if needed. These include: Canes. Walkers. Scooters. Crutches. Review your medicines with your doctor. Some medicines can make you feel dizzy. This can increase your chance of falling. Ask your doctor what else you can do to help prevent falls. Where to find more information Centers for Disease Control and Prevention, [employer]: http://www.wolf.info/ National Institute on Aging: http://kim-miller.com/ Contact a doctor if: You are afraid of falling at  home. You feel weak, drowsy, or dizzy at home. You fall at home. Summary There are many simple things that you can do to make your home safe and to help prevent falls. Ways to make your home safe include removing things that can make you trip and installing grab bars in the bathroom. Ask for help when making these changes in your home. This information is not intended to replace advice given to you by your health care provider. Make sure you discuss any questions you have with your health care provider. Document Revised: 06/19/2020 Document Reviewed: 06/19/2020 Elsevier Patient Education  [place] Maintenance, Female Adopting a healthy lifestyle and getting preventive care are important in promoting health and wellness. Ask your health care provider about: The right schedule for you to have regular tests and exams. Things you can do on your own to prevent diseases and keep yourself healthy. What should I know about diet, weight, and exercise? Eat a healthy diet  Eat a diet that includes plenty of vegetables, fruits, low-fat dairy products, and lean protein. Do not eat a lot of foods that are high in solid fats, added sugars, or sodium. Maintain a healthy weight Body mass index (BMI) is used to identify weight problems. It estimates body fat based on height and weight. Your health care provider can help determine your BMI and help you achieve or maintain a healthy weight. Get regular exercise Get regular exercise. This is one of the most important things you can do for your health. Most adults should: Exercise for at least 150 minutes each week. The exercise should increase your heart rate and make you sweat (moderate-intensity exercise). Do strengthening exercises at least twice a week. This is in addition to the moderate-intensity exercise. Spend less time sitting. Even light physical activity can be beneficial. Watch cholesterol and  blood lipids Have your blood tested  for lipids and cholesterol at 61 years of age, then have this test every 5 years. Have your cholesterol levels checked more often if: Your lipid or cholesterol levels are high. You are older than 62 years of age. You are at high risk for heart disease. What should I know about cancer screening? Depending on your health history and family history, you may need to have cancer screening at various ages. This may include screening for: Breast cancer. Cervical cancer. Colorectal cancer. Skin cancer. Lung cancer. What should I know about heart disease, diabetes, and high blood pressure? Blood pressure and heart disease High blood pressure causes heart disease and increases the risk of stroke. This is more likely to develop in people who have high blood pressure readings, are of African descent, or are overweight. Have your blood pressure checked: Every 3-5 years if you are 68-1 years of age. Every year if you are 26 years old or older. Diabetes Have regular diabetes screenings. This checks your fasting blood sugar level. Have the screening done: Once every three years after age 61 if you are at a normal weight and have a low risk for diabetes. More often and at a younger age if you are overweight or have a high risk for diabetes. What should I know about preventing infection? Hepatitis B If you have a higher risk for hepatitis B, you should be screened for this virus. Talk with your health care provider to find out if you are at risk for hepatitis B infection. Hepatitis C Testing is recommended for: Everyone born from [birth year] through 1965. Anyone with known risk factors for hepatitis C. Sexually transmitted infections (STIs) Get screened for STIs, including gonorrhea and chlamydia, if: You are sexually active and are younger than 61 years of age. You are older than 62 years of age and your health care provider tells you that you are at risk for this type of infection. Your sexual activity  has changed since you were last screened, and you are at increased risk for chlamydia or gonorrhea. Ask your health care provider if you are at risk. Ask your health care provider about whether you are at high risk for HIV. Your health care provider may recommend a prescription medicine to help prevent HIV infection. If you choose to take medicine to prevent HIV, you should first get tested for HIV. You should then be tested every 3 months for as long as you are taking the medicine. Pregnancy If you are about to stop having your period (premenopausal) and you may become pregnant, seek counseling before you get pregnant. Take 400 to 800 micrograms (mcg) of folic acid every day if you become pregnant. Ask for birth control (contraception) if you want to prevent pregnancy. Osteoporosis and menopause Osteoporosis is a disease in which the bones lose minerals and strength with aging. This can result in bone fractures. If you are 63 years old or older, or if you are at risk for osteoporosis and fractures, ask your health care provider if you should: Be screened for bone loss. Take a calcium or vitamin D supplement to lower your risk of fractures. Be given hormone replacement therapy (HRT) to treat symptoms of menopause. Follow these instructions at home: Lifestyle Do not use any products that contain nicotine or tobacco, such as cigarettes, e-cigarettes, and chewing tobacco. If you need help quitting, ask your health care provider. Do not use street drugs. Do not share needles. Ask your health  care provider for help if you need support or information about quitting drugs. Alcohol use Do not drink alcohol if: Your health care provider tells you not to drink. You are pregnant, may be pregnant, or are planning to become pregnant. If you drink alcohol: Limit how much you use to 0-1 drink a day. Limit intake if you are breastfeeding. Be aware of how much alcohol is in your drink. In the [place] one drink  equals one 12 oz bottle of beer (355 mL), one 5 oz glass of wine (148 mL), or one 1 oz glass of hard liquor (44 mL). General instructions Schedule regular health, dental, and eye exams. Stay current with your vaccines. Tell your health care provider if: You often feel depressed. You have ever been abused or do not feel safe at home. Summary Adopting a healthy lifestyle and getting preventive care are important in promoting health and wellness. Follow your health care provider's instructions about healthy diet, exercising, and getting tested or screened for diseases. Follow your health care provider's instructions on monitoring your cholesterol and blood pressure. This information is not intended to replace advice given to you by your health care provider. Make sure you discuss any questions you have with your health care provider. Document Revised: 01/24/2021 Document Reviewed: 11/09/2018 Elsevier Patient Education  2022 [employer].

## 2021-09-18 NOTE — Progress Notes (Signed)
This AWV is being conducted by [facility] only. The patient was located at home and I was located in [facility]. The patient's identity was confirmed using their DOB and current address. The patient or his/her legal guardian has consented to being evaluated through a telephone encounter and understands the associated risks (an examination cannot be done and the patient may need to come in for an appointment) / benefits (allows the patient to remain at home, decreasing exposure to coronavirus). I personally spent 32 minutes conducting the AWV.  Subjective:   Amy Jarvis is a 60 y.o. female who presents for a Medicare Annual Wellness Visit.  The following items have been reviewed and updated today in the appropriate area in the EMR.   Health Risk Assessment  Height, weight, BMI, and BP Visual acuity if needed Depression screen Fall risk / safety level Advance directive discussion Medical and family history were reviewed and updated Updating list of other providers & suppliers Medication reconciliation, including over the counter medicines Cognitive screen Written screening schedule Risk Factor list Personalized health advice, risky behaviors, and treatment advice  Social History   Social History Narrative   Lives alone in a one story home.  Has 3 children.  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     Previously a Psychologist, counselling.     Education: some college.         Objective:    Vitals: Wt (!) 330 lb (149.7 kg)   LMP 03/31/2013   BMI 53.26 kg/m  Vitals are patient reported  Activities of Daily Living No flowsheet data found.  Goals  Goals      Blood Pressure < 140/90     LDL CALC < 130        Fall Risk Fall Risk  03/19/2021 05/31/2020 01/30/2020 10/31/2019 07/11/2019  Falls in the past year? 0 - 0 0 0  Number falls in past yr: - - - - -  Injury with Fall? - 0 - - -  Comment - - - - -  Risk for fall due to : - - - - -  Risk for fall due to: Comment -  - - - -  Follow up - - Falls evaluation completed Falls prevention discussed -    Depression Screen PHQ 2/9 Scores 03/19/2021 05/31/2020 01/30/2020 10/31/2019  PHQ - 2 Score 2 0 2 2  PHQ- 9 Score 9 - 10 14     Cognitive Testing Six-Item Cognitive Screener   "I would like to ask you some questions that ask you to use your memory. I am going to name three objects. Please wait until I say all three words, then repeat them. Remember what they are  because I am going to ask you to name them again in a few minutes. Please repeat these words for me: APPLE--TABLE--PENNY." (Interviewer may repeat names 3 times if necessary but repetition not scored.)  Did patient correctly repeat all three words? Yes - may proceed with screen  What year is this? Correct What month is this? Correct What day of the week is this? Correct  What were the three objects I asked you to remember? Apple Correct Table Correct Penny Correct  Score one point for each incorrect answer.  A score of 2 or more points warrants additional investigation.  Patient's score 0     Assessment and Plan:    During the course of the visit the patient was educated and counseled about appropriate  screening and preventive services as documented in the assessment and plan.  Recommend PCP discuss switching patient from Victoza to Ozempic due to patient wanting to minimize amount of injections.  Recommended patient obtain Shingles vaccine and flu vaccine. Patient will ask about obtaining flu vaccine next week at PCP appt and will go to pharmacy for Shingles vaccines. Patient did receive COVID-19 vaccine but reports needing booster and states she is planning to obtain this as well.    The printed AVS was given to the patient and included an updated screening schedule, a list of risk factors, and personalized health advice.      Hughes Better, RPH-CPP  09/18/2021

## 2021-09-22 NOTE — Progress Notes (Signed)
I discussed the AWV findings with the provider who conducted the visit. I was present in the office suite and immediately available to provide assistance and direction throughout the time the service was provided.  Harvie Heck, MD Internal Medicine, PGY-3 09/22/21 5:43 PM Pager # [phone]

## 2021-09-23 NOTE — Progress Notes (Signed)
Reviewed and signed

## 2021-09-24 ENCOUNTER — Encounter: Payer: Self-pay | Admitting: Internal Medicine

## 2021-09-24 ENCOUNTER — Other Ambulatory Visit: Payer: Self-pay

## 2021-09-24 ENCOUNTER — Ambulatory Visit (INDEPENDENT_AMBULATORY_CARE_PROVIDER_SITE_OTHER): Payer: Medicare Other | Admitting: Internal Medicine

## 2021-09-24 VITALS — BP 174/85 | HR 57 | Temp 98.5°F | Resp 28 | Ht 66.0 in | Wt 331.5 lb

## 2021-09-24 DIAGNOSIS — Z23 Encounter for immunization: Secondary | ICD-10-CM

## 2021-09-24 DIAGNOSIS — R7303 Prediabetes: Secondary | ICD-10-CM | POA: Diagnosis not present

## 2021-09-24 DIAGNOSIS — R600 Localized edema: Secondary | ICD-10-CM | POA: Diagnosis not present

## 2021-09-24 DIAGNOSIS — G4733 Obstructive sleep apnea (adult) (pediatric): Secondary | ICD-10-CM | POA: Diagnosis not present

## 2021-09-24 DIAGNOSIS — I10 Essential (primary) hypertension: Secondary | ICD-10-CM

## 2021-09-24 DIAGNOSIS — B351 Tinea unguium: Secondary | ICD-10-CM | POA: Diagnosis not present

## 2021-09-24 DIAGNOSIS — Z9989 Dependence on other enabling machines and devices: Secondary | ICD-10-CM

## 2021-09-24 LAB — POCT GLYCOSYLATED HEMOGLOBIN (HGB A1C): Hemoglobin A1C: 5.8 % — AB (ref 4.0–5.6)

## 2021-09-24 LAB — BRAIN NATRIURETIC PEPTIDE: B Natriuretic Peptide: 14.9 pg/mL (ref 0.0–100.0)

## 2021-09-24 LAB — GLUCOSE, CAPILLARY: Glucose-Capillary: 90 mg/dL (ref 70–99)

## 2021-09-24 MED ORDER — AMLODIPINE-OLMESARTAN 10-40 MG PO TABS: 1.0000 | ORAL_TABLET | Freq: Every day | ORAL | 0 refills | Status: DC

## 2021-09-24 MED ORDER — OZEMPIC (1 MG/DOSE) 4 MG/3ML ~~LOC~~ SOPN: 1.0000 mg | PEN_INJECTOR | SUBCUTANEOUS | 0 refills | Status: AC

## 2021-09-24 MED ORDER — OZEMPIC (0.25 OR 0.5 MG/DOSE) 2 MG/1.5ML ~~LOC~~ SOPN: 0.5000 mg | PEN_INJECTOR | SUBCUTANEOUS | 0 refills | Status: AC

## 2021-09-24 NOTE — Patient Instructions (Addendum)
Ms Gustie Bobb,  It was a pleasure seeing you in clinic. Today we discussed:   Prediabetes: Your A1c is 5.8 today! Victoza was switched to Ozempic. As discussed, for Weeks 1-4, take 0.5mg  weekly Weeks 5-8, take 1mg  weekly Week 9 and beyond, 2mg  weekly  Please contact us if you have any questions or concerns   Hypertension: At this time, please start taking amlodipine-olmesartan daily and spironolactone daily. Follow up in 4 weeks for BP check and lab work.  Lower extremity edema:  Please use compression stockings. I am checking some labs and will call you with any abnormal results  Toenail discoloration:  Referral to podiatry placed at this time  If you have any questions or concerns, please call our clinic at [phone] between 9am-5pm and after hours call [phone] and ask for the internal medicine resident on call. If you feel you are having a medical emergency please call 911.   Thank you, we look forward to helping you remain healthy!  If you have not gotten the COVID vaccine, I recommend doing so:  You may get it at your local [facility] or [facility] OR To schedule an appointment for a COVID vaccine or be added to the vaccine wait list: Go to [email or web address]   OR Go to [email or web address]                  OR Call [phone]                                     OR Call [phone] and select Option 2

## 2021-09-24 NOTE — Progress Notes (Signed)
Established Patient Office Visit  Subjective:  Patient ID: Amy Jarvis, female    DOB: [birth year]  Age: 60 y.o. MRN: [id number]  CC:  Chief Complaint  Patient presents with   Follow-up    HPI [facility] presents for follow up of her hypertension, prediabetes, lower extremity edema and evaluation of left great toe discoloration. Please see problem based charting for complete assessment and plan.   Past Medical History:  Diagnosis Date   Acute sinusitis 01/20/2011   Qualifier: Diagnosis of  By: Marius Ditch RN, Theresa     Allergic rhinitis 09/23/2015   Carpal tunnel syndrome, bilateral    Cervical polyp    possible history of cervical polyp   Complication of anesthesia    low bp after hysterctomy   Cystocele 01/13/2010   DDD (degenerative disc disease), lumbar    Degenerative disk disease 10/07/2012   Degenerative lumbar spinal stenosis    Diverticulosis 03/26/2010   GERD 09/05/2009   Hyperlipidemia LDL goal < 130 09/25/2008   Hypertension 09/25/2008   Microscopic hematuria 01/13/2010   Morbid obesity 08/28/2010   Obstructive sleep apnea on CPAP 06/20/2010   cpap    Plantar fasciitis    Plantar fasciitis, bilateral 09/23/2016   Pneumonia    6/16   Uterine prolapse 11/19/2008   Vertigo 10/07/2012    Past Surgical History:  Procedure Laterality Date   ABDOMINAL HYSTERECTOMY     CHOLECYSTECTOMY  2010   COLONOSCOPY     COLONOSCOPY WITH PROPOFOL N/A 03/23/2017   Procedure: COLONOSCOPY WITH PROPOFOL;  Surgeon: Ladene Artist, MD;  Location: [facility];  Service: Endoscopy;  Laterality: N/A;   CYSTOSCOPY N/A 12/26/2015   Procedure: CYSTOSCOPY;  Surgeon: Linda Hedges, DO;  Location: [facility];  Service: Gynecology;  Laterality: N/A;   HYSTEROSCOPY WITH D & C N/A 01/02/2014   Procedure: DILATATION AND CURETTAGE /HYSTEROSCOPY;  Surgeon: Linda Hedges, DO;  Location: [facility];  Service: Gynecology;  Laterality: N/A;   LAPAROSCOPIC VAGINAL HYSTERECTOMY WITH SALPINGO OOPHORECTOMY  Bilateral 12/26/2015   Procedure: LAPAROSCOPIC ASSISTED VAGINAL HYSTERECTOMY WITH SALPINGO OOPHORECTOMY;  Surgeon: Linda Hedges, DO;  Location: [facility];  Service: Gynecology;  Laterality: Bilateral;   RECTOCELE REPAIR N/A 12/26/2015   Procedure: POSTERIOR REPAIR (RECTOCELE);  Surgeon: Linda Hedges, DO;  Location: [facility];  Service: Gynecology;  Laterality: N/A;   ROTATOR CUFF REPAIR     left   TUBAL LIGATION  1992    Family History  Problem Relation Age of Onset   Colon cancer Mother 23   Hypertension Mother    Stroke Mother 48   Hypertension Sister    Cancer Brother        kidney   Kidney disease Brother    Prostate cancer Brother    Glaucoma Brother    Hypertension Brother    Hypertension Daughter    Hypertension Son    Breast cancer Other        maternal great-aunt   Cancer Other        maternal great aunt with breast cancer    Social History   Socioeconomic History   Marital status: Divorced    Spouse name: Not on file   Number of children: 3   Years of education: Not on file   Highest education level: Some college, no degree  Occupational History    Employer: UNEMPLOYED  Tobacco Use   Smoking status: Never   Smokeless tobacco: Never  Vaping Use   Vaping Use: Never used  Substance  and Sexual Activity   Alcohol use: No    Alcohol/week: 0.0 standard drinks   Drug use: No   Sexual activity: Not Currently  Other Topics Concern   Not on file  Social History Narrative   Current Social History 09/18/2021        Patient lives alone in an home which is 1 story. There are not steps up to the entrance the patient uses.       Patient's method of transportation is personal car.      The highest level of education was some college.      The patient currently disabled.      Identified important Relationships are "my lord and savior Jesus christ and my children"       Pets : 0       Interests / Fun: "Spend time with my grandchildren, reading the bible, bible study,  church"       Current Stressors: "my general health"      Religious / Personal Beliefs: "Holiness"    Social Determinants of Health   Financial Resource Strain: Not on file  Food Insecurity: Not on file  Transportation Needs: Not on file  Physical Activity: Not on file  Stress: Not on file  Social Connections: Not on file  Intimate Partner Violence: Not on file    Outpatient Medications Prior to Visit  Medication Sig Dispense Refill   Accu-Chek Softclix Lancets lancets Use as instructed 100 each 12   acetaminophen (TYLENOL) 500 MG tablet Take 2 tablets (1,000 mg total) by mouth every 8 (eight) hours as needed for mild pain or moderate pain. 90 tablet 0   azelastine (ASTELIN) 0.1 % nasal spray USE 2 SPRAYS IN EACH NOSTRIL TWICE DAILY AS DIRECTED 30 mL 1   diclofenac Sodium (VOLTAREN) 1 % GEL Apply 2 g topically 4 (four) times daily. 100 g 0   ezetimibe (ZETIA) 10 MG tablet TAKE 1 TABLET(10 MG) BY MOUTH DAILY 90 tablet 1   gabapentin (NEURONTIN) 300 MG capsule Take 300mg  in am and 600mg  in pm prior to bedtime 90 capsule 1   glucose blood (ACCU-CHEK GUIDE) test strip Use as instructed 100 each 12   hydrocortisone 2.5 % ointment Please apply to affected area. 454 g 0   Insulin Pen Needle (PEN NEEDLES 5/16") 30G X 8 MM MISC 1 Units by Does not apply route once a week. Please use 1 needle to inject medication each week. DIAG CODE R73.03. 90 each 1   Loratadine 10 MG CAPS Take 1 capsule (10 mg total) by mouth daily. 90 capsule 3   meclizine (ANTIVERT) 25 MG tablet Take 1 tablet (25 mg total) by mouth 3 (three) times daily as needed. 30 tablet 0   Meloxicam 5 MG CAPS Take 5 mg by mouth daily as needed (pain). 30 capsule 0   omega-3 acid ethyl esters (LOVAZA) 1 g capsule Take 1 g by mouth.     omeprazole (PRILOSEC) 40 MG capsule TAKE 1 CAPSULE(40 MG) BY MOUTH TWICE DAILY 180 capsule 1   rosuvastatin (CRESTOR) 10 MG tablet TAKE 1 TABLET BY MOUTH DAILY (Patient not taking: Reported on  09/18/2021) 90 tablet 1   spironolactone (ALDACTONE) 25 MG tablet Take 1 tablet (25 mg total) by mouth daily. (Patient not taking: Reported on 09/18/2021) 30 tablet 11   venlafaxine XR (EFFEXOR-XR) 75 MG 24 hr capsule Take 75 mg by mouth at bedtime.   0   vitamin C (ASCORBIC ACID) 500 MG tablet Take  500 mg by mouth daily.     fluticasone (FLONASE) 50 MCG/ACT nasal spray SHAKE LIQUID AND USE 1 SPRAY IN EACH NOSTRIL DAILY (Patient not taking: Reported on 09/18/2021) 48 g 1   Olmesartan-amLODIPine-HCTZ 40-10-25 MG TABS TAKE 1 TABLET BY MOUTH DAILY 90 tablet 0   VICTOZA 18 MG/3ML SOPN ADMINISTER 1.8 MG UNDER THE SKIN DAILY 9 mL 3   No facility-administered medications prior to visit.    Allergies  Allergen Reactions   Ace Inhibitors Anaphylaxis, Itching and Swelling   Promethazine Hcl Itching, Swelling, Rash and Other (See Comments)    Pt states that this medication causes her lips to swell.      ROS Negative except as stated in HPI    Objective:    BP (!) 174/85 (BP Location: Left Arm, Cuff Size: Large)   Pulse (!) 57   Temp 98.5 F (36.9 C) (Oral)   Resp (!) 28   Ht 5\' 6"  (1.676 m)   Wt (!) 331 lb 8 oz (150.4 kg)   LMP 03/31/2013   SpO2 97%   BMI 53.51 kg/m  Wt Readings from Last 3 Encounters:  09/24/21 (!) 331 lb 8 oz (150.4 kg)  09/18/21 (!) 330 lb (149.7 kg)  03/19/21 (!) 337 lb 6.4 oz (153 kg)   Physical Exam  Constitutional: Elderly obese female, no acute distress HENT: Normocephalic and atraumatic, moist mucous membranes Cardiovascular: RRR, S1 and S2 present, no m/r/g Distal pulses intact Respiratory: Effort is normal on room air.  Lungs are clear to auscultation bilaterally. Musculoskeletal: Normal bulk and tone.  2+ pitting edema of bilateral lower extremities Neurological: Is alert and oriented x4, no apparent focal deficits noted. Skin: Warm and dry. Discoloration of left first and second toe nails. No rash, erythema, lesions noted. Psychiatric: Normal mood  and affect. Behavior is normal. Judgment and thought content normal.    Assessment & Plan:   Problem List Items Addressed This Visit       Cardiovascular and Mediastinum   Essential hypertension (Chronic)    BP Readings from Last 3 Encounters:  09/24/21 (!) 174/85  03/19/21 (!) 152/71  05/31/20 (!) 145/76  Ms Reagen Goates has a history of uncontrolled hypertension. She was prescribed combination pill of olmesartan-amlodipine-HCTZ 40-10-25mg  daily; however, reports that she has not been taking this as HCTZ component worsens her bilateral lower extremity edema. Discussed with patient that this is not common side effect of thiazide, but rather, of amlodipine. However, patient reports that when she is taking only amlodipine 10mg  daily, her lower extremity edema is at baseline.  Patient is not currently taking spironolactone. She denies any headaches, lightheadedness/dizziness, chest pain or shortness of breath.   Plan: Start amlodipine-olmesartan 10-40mg  daily Advised to start taking spironolactone 25mg  daily Follow up in 4 weeks for BP check and BMP       Relevant Medications   amLODipine-olmesartan (AZOR) 10-40 MG tablet     Respiratory   Obstructive sleep apnea on CPAP    States she is adherent with CPAP        Musculoskeletal and Integument   Onychomycosis and tinea pedis     Patient notes discoloration of the left great toe and second toe for "some time". Denies any worsening of symptoms.  Exam consistent with onychomycosis and overgrown toe nails.  Plan: Referral to podiatry      Relevant Orders   Ambulatory referral to Podiatry     Other   Lower leg edema, chronic  Ms Larrick has had chronic bilateral lower extremity edema for which she has been evaluated with Echo in 2014 that did not show any signs of heart failure. She has been evaluated by vascular surgery for this that showed findings more consistent with lymphedema than venous insufficiency or heart  failure. She was recommended for compression stockings; however, has not been wearing them. She continues to have pitting bilateral lower extremity edema. BNP nl.   Plan: Recommended for compression stockings Continue to monitor      Relevant Orders   Brain natriuretic peptide (Completed)   Prediabetes - Primary    HbA1c 6.0>5.8 today. She is on Victoza 1.8mg  daily. Patient's BMI is 53. Patient would benefit from switching to Ozempic for added weight loss benefits.   Plan: Start Ozempic 0.5mg  weekly, titrate up to 2mg  weekly  Repeat A1c in 3-6 months      Relevant Orders   POC Hbg A1C (Completed)   Other Visit Diagnoses     Need for immunization against influenza       Relevant Orders   Flu Vaccine QUAD 48mo+IM (Fluarix, Fluzone & Alfiuria Quad PF) (Completed)       Meds ordered this encounter  Medications   amLODipine-olmesartan (AZOR) 10-40 MG tablet    Sig: Take 1 tablet by mouth daily.    Dispense:  30 tablet    Refill:  0   Semaglutide,0.25 or 0.5MG /DOS, (OZEMPIC, 0.25 OR 0.5 MG/DOSE,) 2 MG/1.5ML SOPN    Sig: Inject 0.5 mg into the skin once a week for 4 doses.    Dispense:  1.5 mL    Refill:  0   Semaglutide, 1 MG/DOSE, (OZEMPIC, 1 MG/DOSE,) 4 MG/3ML SOPN    Sig: Inject 1 mg into the skin once a week for 4 doses.    Dispense:  3 mL    Refill:  0    Follow-up: Return in about 4 weeks (around 10/22/2021), or if symptoms worsen or fail to improve.    Harvie Heck, MD

## 2021-09-25 NOTE — Assessment & Plan Note (Signed)
Patient notes discoloration of the left great toe and second toe for "some time". Denies any worsening of symptoms.  Exam consistent with onychomycosis and overgrown toe nails.  Plan: Referral to podiatry

## 2021-09-25 NOTE — Assessment & Plan Note (Signed)
Ms Kowal has had chronic bilateral lower extremity edema for which she has been evaluated with Echo in 2014 that did not show any signs of heart failure. She has been evaluated by vascular surgery for this that showed findings more consistent with lymphedema than venous insufficiency or heart failure. She was recommended for compression stockings; however, has not been wearing them. She continues to have pitting bilateral lower extremity edema. BNP nl.   Plan: Recommended for compression stockings Continue to monitor

## 2021-09-25 NOTE — Assessment & Plan Note (Signed)
States she is adherent with CPAP

## 2021-09-25 NOTE — Assessment & Plan Note (Signed)
BP Readings from Last 3 Encounters:  09/24/21 (!) 174/85  03/19/21 (!) 152/71  05/31/20 (!) 145/76   Ms Amy Jarvis has a history of uncontrolled hypertension. She was prescribed combination pill of olmesartan-amlodipine-HCTZ 40-10-25mg  daily; however, reports that she has not been taking this as HCTZ component worsens her bilateral lower extremity edema. Discussed with patient that this is not common side effect of thiazide, but rather, of amlodipine. However, patient reports that when she is taking only amlodipine 10mg  daily, her lower extremity edema is at baseline.  Patient is not currently taking spironolactone. She denies any headaches, lightheadedness/dizziness, chest pain or shortness of breath.   Plan: Start amlodipine-olmesartan 10-40mg  daily Advised to start taking spironolactone 25mg  daily Follow up in 4 weeks for BP check and BMP

## 2021-09-25 NOTE — Assessment & Plan Note (Signed)
HbA1c 6.0>5.8 today. She is on Victoza 1.8mg  daily. Patient's BMI is 53. Patient would benefit from switching to Ozempic for added weight loss benefits.   Plan: Start Ozempic 0.5mg  weekly, titrate up to 2mg  weekly  Repeat A1c in 3-6 months

## 2021-09-29 ENCOUNTER — Encounter: Payer: Self-pay | Admitting: Internal Medicine

## 2021-09-30 ENCOUNTER — Other Ambulatory Visit: Payer: Self-pay

## 2021-09-30 DIAGNOSIS — I10 Essential (primary) hypertension: Secondary | ICD-10-CM

## 2021-09-30 MED ORDER — AMLODIPINE-OLMESARTAN 10-40 MG PO TABS: 1.0000 | ORAL_TABLET | Freq: Every day | ORAL | 0 refills | Status: DC

## 2021-09-30 NOTE — Progress Notes (Signed)
Internal Medicine Clinic Attending ° °Case discussed with Dr. Aslam  At the time of the visit.  We reviewed the resident’s history and exam and pertinent patient test results.  I agree with the assessment, diagnosis, and plan of care documented in the resident’s note.  °

## 2021-10-08 ENCOUNTER — Encounter: Payer: Self-pay | Admitting: Podiatry

## 2021-10-08 ENCOUNTER — Ambulatory Visit: Payer: Medicare Other | Admitting: Podiatry

## 2021-10-08 ENCOUNTER — Other Ambulatory Visit: Payer: Self-pay

## 2021-10-08 DIAGNOSIS — B351 Tinea unguium: Secondary | ICD-10-CM | POA: Diagnosis not present

## 2021-10-08 DIAGNOSIS — M722 Plantar fascial fibromatosis: Secondary | ICD-10-CM | POA: Diagnosis not present

## 2021-10-08 MED ORDER — TERBINAFINE HCL 250 MG PO TABS: 250.0000 mg | ORAL_TABLET | Freq: Every day | ORAL | 0 refills | Status: AC

## 2021-10-08 NOTE — Patient Instructions (Signed)

## 2021-10-08 NOTE — Progress Notes (Signed)
  Subjective:  Patient ID: Amy Jarvis, female    DOB: [birth year],   MRN: [id number]  Chief Complaint  Patient presents with   Nail Problem    New patient Onychomycosis of left great toe.     60 y.o. female presents for concern of left great toenail fungus that has been present for several years. Relates it fell off and grew back discolored and thickened. States she also has a history of plantar fasciitis and wondering what she can do in the future. . Denies any other pedal complaints. Denies n/v/f/c.   Past Medical History:  Diagnosis Date   Acute sinusitis 01/20/2011   Qualifier: Diagnosis of  By: Marius Ditch RN, Theresa     Allergic rhinitis 09/23/2015   Carpal tunnel syndrome, bilateral    Cervical polyp    possible history of cervical polyp   Complication of anesthesia    low bp after hysterctomy   Cystocele 01/13/2010   DDD (degenerative disc disease), lumbar    Degenerative disk disease 10/07/2012   Degenerative lumbar spinal stenosis    Diverticulosis 03/26/2010   GERD 09/05/2009   Hyperlipidemia LDL goal < 130 09/25/2008   Hypertension 09/25/2008   Microscopic hematuria 01/13/2010   Morbid obesity 08/28/2010   Obstructive sleep apnea on CPAP 06/20/2010   cpap    Plantar fasciitis    Plantar fasciitis, bilateral 09/23/2016   Pneumonia    6/16   Uterine prolapse 11/19/2008   Vertigo 10/07/2012    Objective:  Physical Exam: Vascular: DP/PT pulses 2/4 bilateral. CFT <3 seconds. Normal hair growth on digits. No edema.  Skin. No lacerations or abrasions bilateral feet. Nails 1-5 are thickened discolored and elongated with subungual debris. Left hallux nail the worst.  Musculoskeletal: MMT 5/5 bilateral lower extremities in DF, PF, Inversion and Eversion. Deceased ROM in DF of ankle joint. No tenderness currently to medial calcaneal tubercle.  Neurological: Sensation intact to light touch.   Assessment:   1. Onychomycosis   2. Plantar fasciitis, left      Plan:   Patient was evaluated and treated and all questions answered. -Examined patient -Discussed treatment options for painful dystrophic nails  -Discussed fungal nail treatment options including oral, topical, and laser treatments.  -Patient has been on lamisil in the past and would like to try again. Prescription provided.  -Stretching exercises for plantar fasciitis provided and discussed supportive shoes.  -Patient to return in 3 months for evaluation.    Lorenda Peck, DPM

## 2021-10-10 ENCOUNTER — Other Ambulatory Visit: Payer: Self-pay

## 2021-10-10 ENCOUNTER — Ambulatory Visit
Admission: RE | Admit: 2021-10-10 | Discharge: 2021-10-10 | Disposition: A | Discharge status: Home / Self Care | Payer: Medicare Other | Source: Ambulatory Visit | Attending: Student in an Organized Health Care Education/Training Program | Admitting: Student in an Organized Health Care Education/Training Program

## 2021-10-10 DIAGNOSIS — Z1231 Encounter for screening mammogram for malignant neoplasm of breast: Secondary | ICD-10-CM | POA: Diagnosis not present

## 2021-10-14 DIAGNOSIS — G4733 Obstructive sleep apnea (adult) (pediatric): Secondary | ICD-10-CM | POA: Diagnosis not present

## 2021-10-27 ENCOUNTER — Ambulatory Visit (INDEPENDENT_AMBULATORY_CARE_PROVIDER_SITE_OTHER): Payer: Medicare Other | Admitting: Internal Medicine

## 2021-10-27 ENCOUNTER — Encounter: Payer: Self-pay | Admitting: Internal Medicine

## 2021-10-27 VITALS — BP 149/56 | HR 88 | Temp 98.1°F | Wt 330.8 lb

## 2021-10-27 DIAGNOSIS — E349 Endocrine disorder, unspecified: Secondary | ICD-10-CM

## 2021-10-27 DIAGNOSIS — E785 Hyperlipidemia, unspecified: Secondary | ICD-10-CM | POA: Diagnosis not present

## 2021-10-27 DIAGNOSIS — I10 Essential (primary) hypertension: Secondary | ICD-10-CM

## 2021-10-27 DIAGNOSIS — R600 Localized edema: Secondary | ICD-10-CM | POA: Diagnosis not present

## 2021-10-27 DIAGNOSIS — Z6841 Body Mass Index (BMI) 40.0 and over, adult: Secondary | ICD-10-CM

## 2021-10-27 DIAGNOSIS — R7303 Prediabetes: Secondary | ICD-10-CM

## 2021-10-27 MED ORDER — AMLODIPINE-OLMESARTAN 10-40 MG PO TABS: 1.0000 | ORAL_TABLET | Freq: Every day | ORAL | 2 refills | Status: DC

## 2021-10-27 MED ORDER — SPIRONOLACTONE 25 MG PO TABS: 25.0000 mg | ORAL_TABLET | Freq: Every day | ORAL | 2 refills | Status: DC

## 2021-10-27 NOTE — Patient Instructions (Addendum)
Thank you, Ms.Marylene Buerger for allowing [name] to provide your care today!  Today we discussed:  High blood pressure: Your numbers have improved! Continue taking amlodipine-olmesartan. Start taking spironolactone daily   Weight loss: Finish Ozempic 0.5 and then start taking 1.0 weekly   Medications were refilled today    I will call you if any labs, tests, or imaging results require any further attention or action.   I have ordered the following labs for you:  Lab Orders         BMP8+Anion Gap        Medications ordered  Start the following medications: Meds ordered this encounter  Medications   amLODipine-olmesartan (AZOR) 10-40 MG tablet    Sig: Take 1 tablet by mouth daily.    Dispense:  90 tablet    Refill:  2   spironolactone (ALDACTONE) 25 MG tablet    Sig: Take 1 tablet (25 mg total) by mouth daily.    Dispense:  90 tablet    Refill:  2      Follow up in: 6 weeks    Should you have any questions or concerns please call the internal medicine clinic at [phone].     Lajean Manes, MD  Internal Medicine Resident, PGY-1 [facility]

## 2021-10-27 NOTE — Assessment & Plan Note (Addendum)
1 lbs weight loss since 1 mo ago. Reports taking ozempic 0.5 weekly without any adverse effects. States that she has noticed a slight decrease in appetite in the past week since starting medication.   Finish Ozempic 0.5 supply and then start Ozempic 1.0 weekly F/u in 1 mo

## 2021-10-27 NOTE — Assessment & Plan Note (Addendum)
BP today 149/56 closer to goal compared to 174/85 during previous visit 1 mo ago. Pt was started on amlodipine-olmesartan 10-40 and advised to start taking spiro 25. Reports excellent compliance to amlodipine-olmesartan without any adverse effects. Has not been taking Arlyce Harman because "the pharmacy did not give it to me". Will send in prescription of Arlyce Harman. Denies headaches, SHOB, CP, and palpitations. Discussed lifestyle modifications and to measure BP at home and to bring in a log during f/u visit in 1 mo.   Continue amlodipine-olmesartan 10-40 qd Start taking Spiro 25 qd Continue lifestyle modifications  BP log and bring at f/u visit in 1 mo  F/u on BMP from today   Addendum: BMP with renal fxn wnl. Continue regimen above.

## 2021-10-27 NOTE — Assessment & Plan Note (Signed)
Continue Zetia 10 mg qd. Discussed lifestyle modifications today. Pt reports that she is working on [employer] and daily exercise.

## 2021-10-27 NOTE — Progress Notes (Signed)
CC: 4 week f/u from 10/26 for HTN   HPI:  [name] is a 60 y.o. female with a PMHx stated below and presents today for stated above. Please see the Encounters tab for problem-based Assessment & Plan for additional details.   Past Medical History:  Diagnosis Date   Acute sinusitis 01/20/2011   Qualifier: Diagnosis of  By: Marius Ditch RN, Theresa     Allergic rhinitis 09/23/2015   Carpal tunnel syndrome, bilateral    Cervical polyp    possible history of cervical polyp   Complication of anesthesia    low bp after hysterctomy   Cystocele 01/13/2010   DDD (degenerative disc disease), lumbar    Degenerative disk disease 10/07/2012   Degenerative lumbar spinal stenosis    Diverticulosis 03/26/2010   GERD 09/05/2009   Hyperlipidemia LDL goal < 130 09/25/2008   Hypertension 09/25/2008   Microscopic hematuria 01/13/2010   Morbid obesity 08/28/2010   Obstructive sleep apnea on CPAP 06/20/2010   cpap    Plantar fasciitis    Plantar fasciitis, bilateral 09/23/2016   Pneumonia    6/16   Uterine prolapse 11/19/2008   Vertigo 10/07/2012    Current Outpatient Medications on File Prior to Visit  Medication Sig Dispense Refill   Accu-Chek Softclix Lancets lancets Use as instructed 100 each 12   acetaminophen (TYLENOL) 500 MG tablet Take 2 tablets (1,000 mg total) by mouth every 8 (eight) hours as needed for mild pain or moderate pain. 90 tablet 0   amLODipine-olmesartan (AZOR) 10-40 MG tablet Take 1 tablet by mouth daily. 30 tablet 0   azelastine (ASTELIN) 0.1 % nasal spray USE 2 SPRAYS IN EACH NOSTRIL TWICE DAILY AS DIRECTED 30 mL 1   diclofenac Sodium (VOLTAREN) 1 % GEL Apply 2 g topically 4 (four) times daily. 100 g 0   ezetimibe (ZETIA) 10 MG tablet TAKE 1 TABLET(10 MG) BY MOUTH DAILY 90 tablet 1   gabapentin (NEURONTIN) 300 MG capsule Take 300mg  in am and 600mg  in pm prior to bedtime 90 capsule 1   glucose blood (ACCU-CHEK GUIDE) test strip Use as instructed 100 each 12    hydrocortisone 2.5 % ointment Please apply to affected area. 454 g 0   Insulin Pen Needle (PEN NEEDLES 5/16") 30G X 8 MM MISC 1 Units by Does not apply route once a week. Please use 1 needle to inject medication each week. DIAG CODE R73.03. 90 each 1   Loratadine 10 MG CAPS Take 1 capsule (10 mg total) by mouth daily. 90 capsule 3   meclizine (ANTIVERT) 25 MG tablet Take 1 tablet (25 mg total) by mouth 3 (three) times daily as needed. 30 tablet 0   Meloxicam 5 MG CAPS Take 5 mg by mouth daily as needed (pain). 30 capsule 0   omega-3 acid ethyl esters (LOVAZA) 1 g capsule Take 1 g by mouth.     omeprazole (PRILOSEC) 40 MG capsule TAKE 1 CAPSULE(40 MG) BY MOUTH TWICE DAILY 180 capsule 1   rosuvastatin (CRESTOR) 10 MG tablet TAKE 1 TABLET BY MOUTH DAILY (Patient not taking: Reported on 09/18/2021) 90 tablet 1   Semaglutide, 1 MG/DOSE, (OZEMPIC, 1 MG/DOSE,) 4 MG/3ML SOPN Inject 1 mg into the skin once a week for 4 doses. 3 mL 0   spironolactone (ALDACTONE) 25 MG tablet Take 1 tablet (25 mg total) by mouth daily. (Patient not taking: Reported on 09/18/2021) 30 tablet 11   terbinafine (LAMISIL) 250 MG tablet Take 1 tablet (250 mg total)  by mouth daily. 90 tablet 0   venlafaxine XR (EFFEXOR-XR) 75 MG 24 hr capsule Take 75 mg by mouth at bedtime.   0   vitamin C (ASCORBIC ACID) 500 MG tablet Take 500 mg by mouth daily.     No current facility-administered medications on file prior to visit.    Family History  Problem Relation Age of Onset   Colon cancer Mother 28   Hypertension Mother    Stroke Mother 67   Hypertension Sister    Cancer Brother        kidney   Kidney disease Brother    Prostate cancer Brother    Glaucoma Brother    Hypertension Brother    Hypertension Daughter    Hypertension Son    Breast cancer Other        maternal great-aunt   Cancer Other        maternal great aunt with breast cancer    Social History   Socioeconomic History   Marital status: Divorced    Spouse  name: Not on file   Number of children: 3   Years of education: Not on file   Highest education level: Some college, no degree  Occupational History    Employer: UNEMPLOYED  Tobacco Use   Smoking status: Never   Smokeless tobacco: Never  Vaping Use   Vaping Use: Never used  Substance and Sexual Activity   Alcohol use: No    Alcohol/week: 0.0 standard drinks   Drug use: No   Sexual activity: Not Currently  Other Topics Concern   Not on file  Social History Narrative   Current Social History 09/18/2021        Patient lives alone in an home which is 1 story. There are not steps up to the entrance the patient uses.       Patient's method of transportation is personal car.      The highest level of education was some college.      The patient currently disabled.      Identified important Relationships are "my lord and savior Jesus christ and my children"       Pets : 0       Interests / Fun: "Spend time with my grandchildren, reading the bible, bible study, church"       Current Stressors: "my general health"      Religious / Personal Beliefs: "Holiness"    Social Determinants of Health   Financial Resource Strain: Not on file  Food Insecurity: Not on file  Transportation Needs: Not on file  Physical Activity: Not on file  Stress: Not on file  Social Connections: Not on file  Intimate Partner Violence: Not on file    Review of Systems: ROS negative except for what is noted on the assessment and plan.  Vitals:   10/27/21 1330  BP: (!) 149/56  Pulse: 88  SpO2: 99%  Weight: (!) 330 lb 12.8 oz (150 kg)     Physical Exam: Constitutional: alert, well-appearing, in NAD HENT: normocephalic, atraumatic, mucous membranes moist Eyes: conjunctiva non-erythematous, EOMI Cardiovascular: RRR, no m/r/g, non-edematous bilateral LE Pulmonary/Chest: normal work of breathing on RA, LCTAB Abdominal: soft, non-tender to palpation, non-distended MSK: normal bulk and tone   Neurological: A&O x 3 Skin: warm and dry  Psych: normal behavior, normal affect     Assessment & Plan:   See Encounters Tab for problem based charting.  Patient seen with Dr. Tera Helper, MD  Internal  Medicine Resident, PGY-1 [facility]

## 2021-10-27 NOTE — Assessment & Plan Note (Signed)
HbA1c5.8 one mo ago. Was started on Ozempic for added weight loss benefits given BMI 53. Reports excellent compliance to Ozempic with no adverse effects.   Plan: Finish Ozempic 0.5mg  weekly and then start Ozempic 1 mg weekly  Follow up in 1 mo  Repeat A1c in 2-5 months

## 2021-10-27 NOTE — Assessment & Plan Note (Signed)
Mild 1+ lower extremity pitting edema. Pt reports that it is at baseline and she continues to wear compression stockings daily.   Continue wearing compression stockings daily  CTM

## 2021-10-28 LAB — BMP8+ANION GAP
Anion Gap: 17 mmol/L (ref 10.0–18.0)
BUN/Creatinine Ratio: 21 (ref 12–28)
BUN: 15 mg/dL (ref 8–27)
CO2: 22 mmol/L (ref 20–29)
Calcium: 9.5 mg/dL (ref 8.7–10.3)
Chloride: 103 mmol/L (ref 96–106)
Creatinine, Ser: 0.73 mg/dL (ref 0.57–1.00)
Glucose: 92 mg/dL (ref 70–99)
Potassium: 4.1 mmol/L (ref 3.5–5.2)
Sodium: 142 mmol/L (ref 134–144)
eGFR: 94 mL/min/{1.73_m2} (ref >59)

## 2021-10-29 NOTE — Progress Notes (Signed)
Internal Medicine Clinic Attending  I saw and evaluated the patient.  I personally confirmed the key portions of the history and exam documented by Dr. Patel and I reviewed pertinent patient test results.  The assessment, diagnosis, and plan were formulated together and I agree with the documentation in the resident's note.  

## 2021-11-10 ENCOUNTER — Encounter: Payer: Self-pay | Admitting: Internal Medicine

## 2021-11-11 MED ORDER — MELOXICAM 5 MG PO CAPS: 5.0000 mg | ORAL_CAPSULE | Freq: Every day | ORAL | 0 refills | Status: DC | PRN

## 2021-11-13 ENCOUNTER — Encounter: Payer: Self-pay | Admitting: Internal Medicine

## 2021-11-14 ENCOUNTER — Telehealth: Payer: Self-pay

## 2021-11-14 NOTE — Telephone Encounter (Signed)
Pt states the insurance will not pay for Meloxicam 5 MG CAPS, per pt it needs to be Meloxicam 15mg . Please call pt back.

## 2021-11-17 MED ORDER — MELOXICAM 15 MG PO TBDP: 15.0000 mg | ORAL_TABLET | Freq: Every day | ORAL | 0 refills | Status: DC | PRN

## 2021-11-19 NOTE — Telephone Encounter (Signed)
Received call from Hayden at [place]. VO given to change ODT to tablets. Nothing further needed.

## 2021-12-04 ENCOUNTER — Other Ambulatory Visit: Payer: Self-pay | Admitting: Internal Medicine

## 2021-12-04 ENCOUNTER — Encounter: Payer: Self-pay | Admitting: Internal Medicine

## 2021-12-04 DIAGNOSIS — L309 Dermatitis, unspecified: Secondary | ICD-10-CM

## 2021-12-05 MED ORDER — HYDROCORTISONE 2.5 % EX OINT: TOPICAL_OINTMENT | CUTANEOUS | 0 refills | Status: DC

## 2021-12-05 MED ORDER — OZEMPIC (2 MG/DOSE) 8 MG/3ML ~~LOC~~ SOPN: 2.0000 mg | PEN_INJECTOR | SUBCUTANEOUS | 1 refills | Status: AC

## 2021-12-08 ENCOUNTER — Telehealth: Payer: Self-pay | Admitting: *Deleted

## 2021-12-30 ENCOUNTER — Encounter: Payer: Self-pay | Admitting: Internal Medicine

## 2022-01-01 ENCOUNTER — Encounter: Payer: Self-pay | Admitting: Internal Medicine

## 2022-01-01 MED ORDER — GABAPENTIN 300 MG PO CAPS: ORAL_CAPSULE | ORAL | 0 refills | Status: DC

## 2022-01-12 ENCOUNTER — Ambulatory Visit: Payer: Medicare Other | Admitting: Podiatry

## 2022-01-13 DIAGNOSIS — G4733 Obstructive sleep apnea (adult) (pediatric): Secondary | ICD-10-CM | POA: Diagnosis not present

## 2022-01-16 DIAGNOSIS — M5451 Vertebrogenic low back pain: Secondary | ICD-10-CM | POA: Diagnosis not present

## 2022-01-16 DIAGNOSIS — M5459 Other low back pain: Secondary | ICD-10-CM | POA: Diagnosis not present

## 2022-01-16 DIAGNOSIS — M48062 Spinal stenosis, lumbar region with neurogenic claudication: Secondary | ICD-10-CM | POA: Diagnosis not present

## 2022-01-20 ENCOUNTER — Encounter: Payer: Medicare Other | Admitting: Internal Medicine

## 2022-01-24 DIAGNOSIS — M5451 Vertebrogenic low back pain: Secondary | ICD-10-CM | POA: Diagnosis not present

## 2022-01-27 ENCOUNTER — Encounter: Payer: Self-pay | Admitting: Internal Medicine

## 2022-01-27 ENCOUNTER — Ambulatory Visit (INDEPENDENT_AMBULATORY_CARE_PROVIDER_SITE_OTHER): Payer: Medicare Other | Admitting: Internal Medicine

## 2022-01-27 VITALS — BP 156/66 | HR 92 | Temp 98.3°F | Ht 66.0 in | Wt 328.1 lb

## 2022-01-27 DIAGNOSIS — I10 Essential (primary) hypertension: Secondary | ICD-10-CM

## 2022-01-27 DIAGNOSIS — D123 Benign neoplasm of transverse colon: Secondary | ICD-10-CM | POA: Diagnosis not present

## 2022-01-27 DIAGNOSIS — J309 Allergic rhinitis, unspecified: Secondary | ICD-10-CM | POA: Diagnosis not present

## 2022-01-27 DIAGNOSIS — J019 Acute sinusitis, unspecified: Secondary | ICD-10-CM

## 2022-01-27 DIAGNOSIS — L309 Dermatitis, unspecified: Secondary | ICD-10-CM

## 2022-01-27 DIAGNOSIS — M722 Plantar fascial fibromatosis: Secondary | ICD-10-CM

## 2022-01-27 DIAGNOSIS — K219 Gastro-esophageal reflux disease without esophagitis: Secondary | ICD-10-CM

## 2022-01-27 DIAGNOSIS — E785 Hyperlipidemia, unspecified: Secondary | ICD-10-CM | POA: Diagnosis not present

## 2022-01-27 DIAGNOSIS — R7303 Prediabetes: Secondary | ICD-10-CM

## 2022-01-27 DIAGNOSIS — G894 Chronic pain syndrome: Secondary | ICD-10-CM | POA: Diagnosis not present

## 2022-01-27 MED ORDER — HYDROCORTISONE 2.5 % EX OINT: TOPICAL_OINTMENT | CUTANEOUS | 0 refills | Status: DC

## 2022-01-27 MED ORDER — AZELASTINE HCL 0.1 % NA SOLN: NASAL | 1 refills | Status: DC

## 2022-01-27 MED ORDER — SEMAGLUTIDE (2 MG/DOSE) 8 MG/3ML ~~LOC~~ SOPN: 2.0000 mg | PEN_INJECTOR | SUBCUTANEOUS | 2 refills | Status: DC

## 2022-01-27 MED ORDER — GABAPENTIN 300 MG PO CAPS: ORAL_CAPSULE | ORAL | 0 refills | Status: DC

## 2022-01-27 MED ORDER — OMEPRAZOLE 40 MG PO CPDR: DELAYED_RELEASE_CAPSULE | ORAL | 1 refills | Status: DC

## 2022-01-27 MED ORDER — SPIRONOLACTONE 25 MG PO TABS: 25.0000 mg | ORAL_TABLET | Freq: Every day | ORAL | 3 refills | Status: DC

## 2022-01-27 MED ORDER — DICLOFENAC SODIUM 1 % EX GEL: 2.0000 g | Freq: Four times a day (QID) | CUTANEOUS | 0 refills | Status: DC

## 2022-01-27 MED ORDER — EZETIMIBE 10 MG PO TABS: 10.0000 mg | ORAL_TABLET | Freq: Every day | ORAL | 3 refills | Status: DC

## 2022-01-27 MED ORDER — LORATADINE 10 MG PO CAPS: 10.0000 mg | ORAL_CAPSULE | Freq: Every day | ORAL | 3 refills | Status: DC

## 2022-01-27 MED ORDER — ACETAMINOPHEN 500 MG PO TABS: 1000.0000 mg | ORAL_TABLET | Freq: Three times a day (TID) | ORAL | 0 refills | Status: AC | PRN

## 2022-01-27 NOTE — Patient Instructions (Addendum)
Ms Amy Jarvis  It was a pleasure seeing you in clinic. Today we discussed:   Blood pressure: Your blood pressure was elevated at this visit. Please continue taking all medications as prescribed. Avoid NSAID use. Check your blood pressure at home daily and bring a log of this to your next appointment. Follow up in 2 weeks.   If you have any questions or concerns, please call our clinic at [phone] between 9am-5pm and after hours call [phone] and ask for the internal medicine resident on call. If you feel you are having a medical emergency please call 911.   Thank you, we look forward to helping you remain healthy!

## 2022-01-27 NOTE — Progress Notes (Signed)
° °  CC: hypertension and prediabetes follow up  HPI:  [name] is a 61 y.o. female with PMHx as stated below presenting for follow up of her hypertension and prediabetes. Please see problem based charting for complete assesment and plan.  Past Medical History:  Diagnosis Date   Acute sinusitis 01/20/2011   Qualifier: Diagnosis of  By: Marius Ditch RN, Theresa     Allergic rhinitis 09/23/2015   Carpal tunnel syndrome, bilateral    Cervical polyp    possible history of cervical polyp   Complication of anesthesia    low bp after hysterctomy   Cystocele 01/13/2010   DDD (degenerative disc disease), lumbar    Degenerative disk disease 10/07/2012   Degenerative lumbar spinal stenosis    Diverticulosis 03/26/2010   GERD 09/05/2009   Hyperlipidemia LDL goal < 130 09/25/2008   Hypertension 09/25/2008   Microscopic hematuria 01/13/2010   Morbid obesity 08/28/2010   Obstructive sleep apnea on CPAP 06/20/2010   cpap    Plantar fasciitis    Plantar fasciitis, bilateral 09/23/2016   Pneumonia    6/16   Uterine prolapse 11/19/2008   Vertigo 10/07/2012   Review of Systems:  Negative except as stated in HPI  Physical Exam:  Vitals:   01/27/22 1047  BP: (!) 170/84  Pulse: 92  Temp: 98.3 F (36.8 C)  TempSrc: Oral  SpO2: 100%  Weight: (!) 328 lb 1.6 oz (148.8 kg)  Height: 5\' 6"  (1.676 m)   Physical Exam  Constitutional: Obese middle aged female, no acute distress  Cardiovascular: Normal rate, regular rhythm, S1 and S2 present, no murmurs, rubs, gallops.  Distal pulses intact Respiratory: No respiratory distress, Lungs are clear to auscultation bilaterally. Musculoskeletal: Normal bulk and tone.  Neurological: Is alert and oriented x4, no apparent focal deficits noted. Skin: Warm and dry.  No rash, erythema, lesions noted. Psychiatric: Normal mood and affect.   Assessment & Plan:   See Encounters Tab for problem based charting.  Patient discussed with Dr.  Cain Sieve

## 2022-01-29 NOTE — Assessment & Plan Note (Signed)
Most recent A1c 5.8. Patient was started on Ozempic and reports good compliance without adverse effects. She does continue to have weight loss benefit. Patient advised to increase ozempic to 2mg  weekly ? ?Plan: ?Ozempic 2mg  weekly  ?Repeat A1c in 2 months ?

## 2022-01-29 NOTE — Assessment & Plan Note (Signed)
Patient's prior colonoscopy in 02/2017 with 54mm sessile polyp in splenic flexure. Recommended for repeat colonoscopy in 5 years. ? ?Plan: ?Referral to GI for colonoscopy  ?

## 2022-01-29 NOTE — Assessment & Plan Note (Signed)
Zetia 10mg  daily refilled  ?

## 2022-01-29 NOTE — Assessment & Plan Note (Signed)
Stable. Refilled loratadine 10mg  daily and azelastine 0.1% nasal spray ?

## 2022-01-29 NOTE — Assessment & Plan Note (Signed)
BP Readings from Last 3 Encounters:  ?01/27/22 (!) 156/66  ?10/27/21 (!) 149/56  ?09/24/21 (!) 174/85  ? ?Patient is following up for her hypertension today. She reports compliance with her amlodpine-olmesartan and spironolactone since her last v isit; however, BP continues to be above goal. She does note ongoing chronic back pain for which she has taking "migraine medicine" that she is unable to recall the name of. Suspect she may have taken excessive NSAIDs which could be contributing to her persistently elevated blood pressures. Patient advised against further NSAID use and to keep BP log at home. ? ?Plan: ?Continue current regimen of amlodipine-olmesartan 10-40mg  daily and spironolactone 25mg  daily  ?Advised against NSAID use  ?Advised to keep BP log and bring to next visit ?Follow up in 2 weeks for BP check  ?If blood pressures remain persistently elevated, would consider addition of chlorthalidone  ?

## 2022-01-29 NOTE — Assessment & Plan Note (Signed)
Patient follows with orthopedics and notes she recently had MRI with them and is planning on undergoing surgical intervention if possible for her chronic pain. Currently requesting refill on gabapentin and tylenol. ? ?

## 2022-01-29 NOTE — Assessment & Plan Note (Signed)
Stable. Refilled omeprazole 40mg  daily ?

## 2022-01-29 NOTE — Assessment & Plan Note (Signed)
Refilled hydrocortisone 2.5% ointment  ?

## 2022-02-02 NOTE — Progress Notes (Signed)
Internal Medicine Clinic Attending ° °Case discussed with Dr. Aslam  At the time of the visit.  We reviewed the resident’s history and exam and pertinent patient test results.  I agree with the assessment, diagnosis, and plan of care documented in the resident’s note.  °

## 2022-02-05 DIAGNOSIS — M5451 Vertebrogenic low back pain: Secondary | ICD-10-CM | POA: Diagnosis not present

## 2022-02-09 ENCOUNTER — Encounter: Payer: Medicare Other | Admitting: Internal Medicine

## 2022-02-10 DIAGNOSIS — M48061 Spinal stenosis, lumbar region without neurogenic claudication: Secondary | ICD-10-CM | POA: Diagnosis not present

## 2022-02-10 DIAGNOSIS — M5416 Radiculopathy, lumbar region: Secondary | ICD-10-CM | POA: Diagnosis not present

## 2022-02-18 ENCOUNTER — Encounter: Payer: Self-pay | Admitting: Gastroenterology

## 2022-02-18 ENCOUNTER — Encounter: Payer: Self-pay | Admitting: Internal Medicine

## 2022-02-18 DIAGNOSIS — G4733 Obstructive sleep apnea (adult) (pediatric): Secondary | ICD-10-CM

## 2022-02-21 IMAGING — MG MM DIGITAL SCREENING BILAT W/ TOMO AND CAD
8 of 16 series · 8 of 40 positions shown · non-contrast
Comparison: Previous exam(s).

ACR Breast Density Category a: The breast tissue is almost entirely
fatty.

CLINICAL DATA: Screening.

EXAM:
DIGITAL SCREENING BILATERAL MAMMOGRAM WITH TOMOSYNTHESIS AND CAD
TECHNIQUE: Bilateral screening digital craniocaudal and mediolateral oblique
mammograms were obtained. Bilateral screening digital breast
tomosynthesis was performed. The images were evaluated with
computer-aided detection.

[L MLO synth-2D]
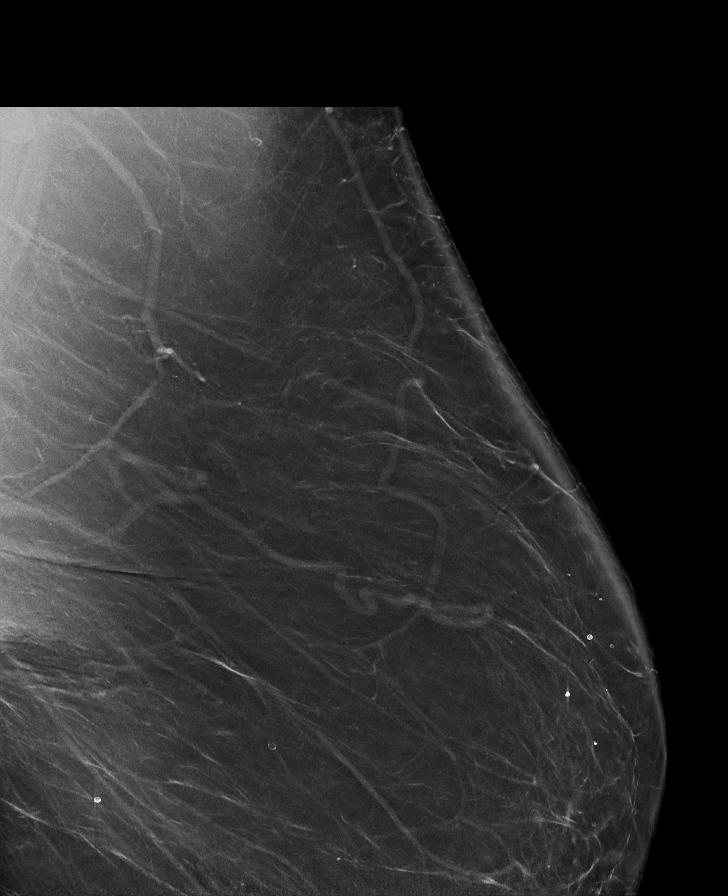

[L CC synth-2D]
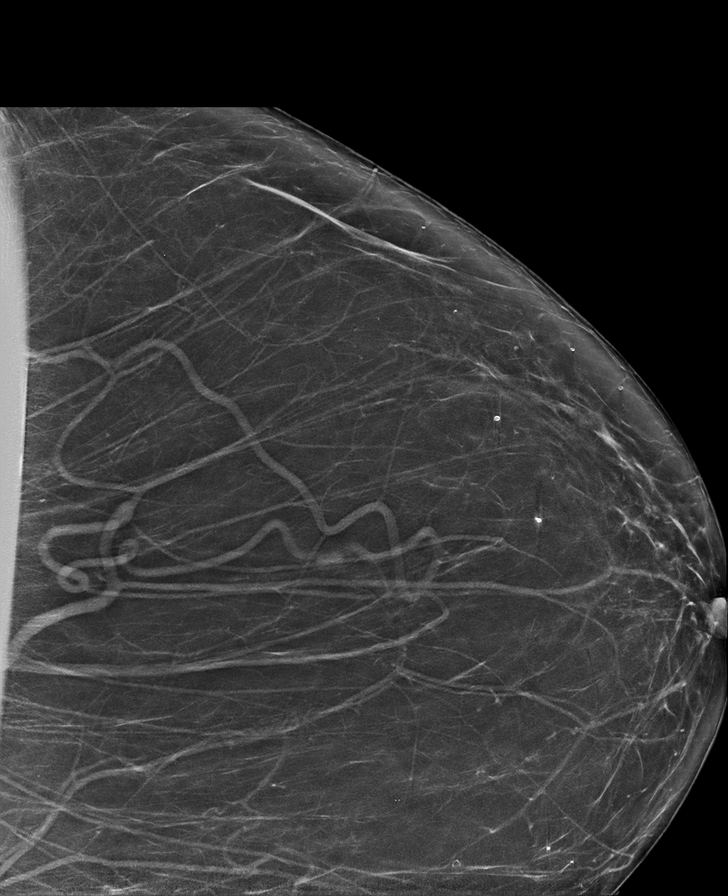

[L CV synth-2D]
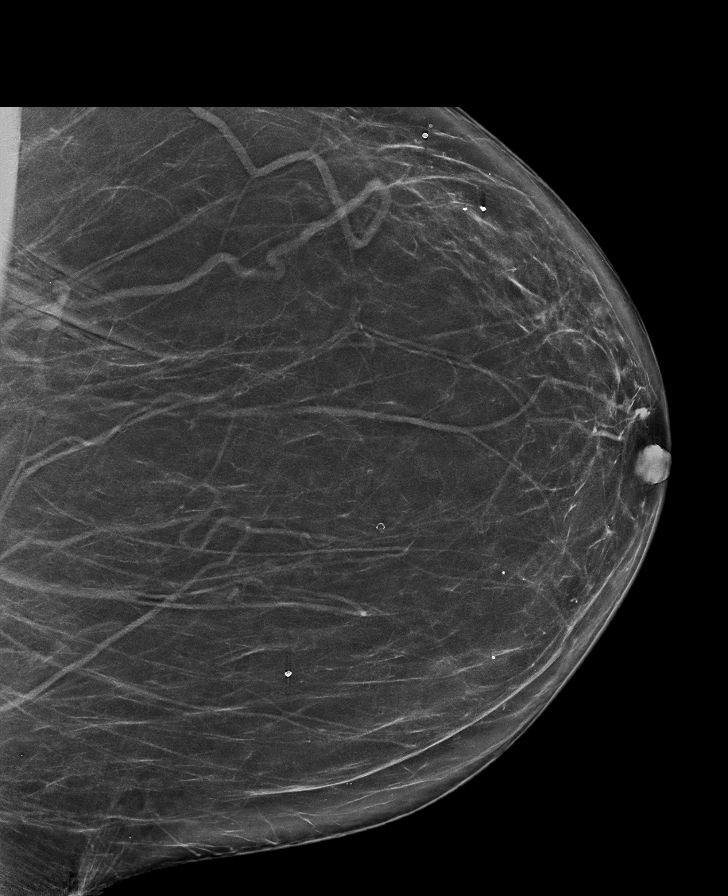

[R CC synth-2D]
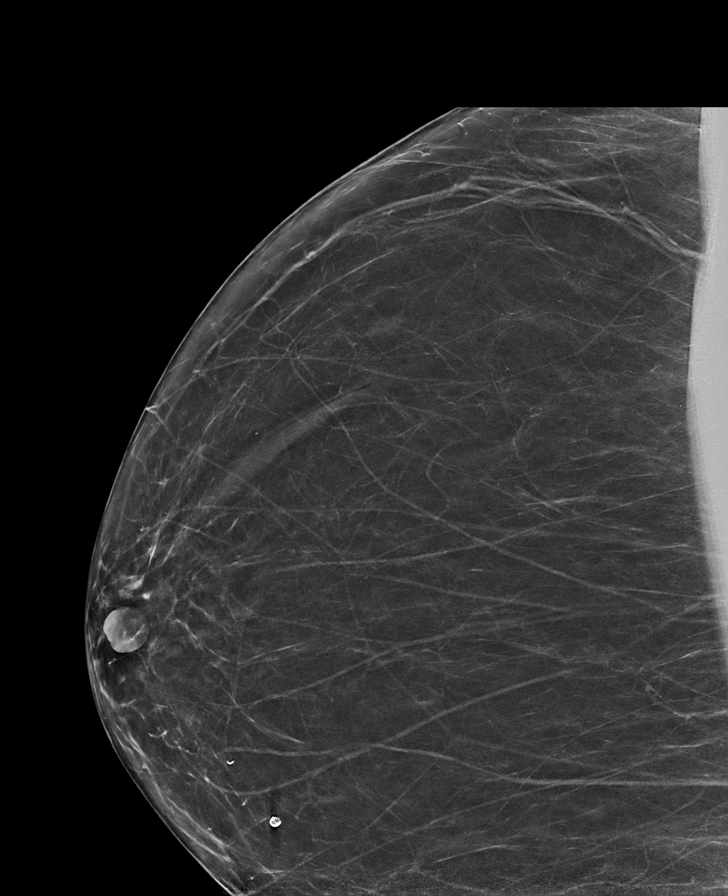

[R CV synth-2D]
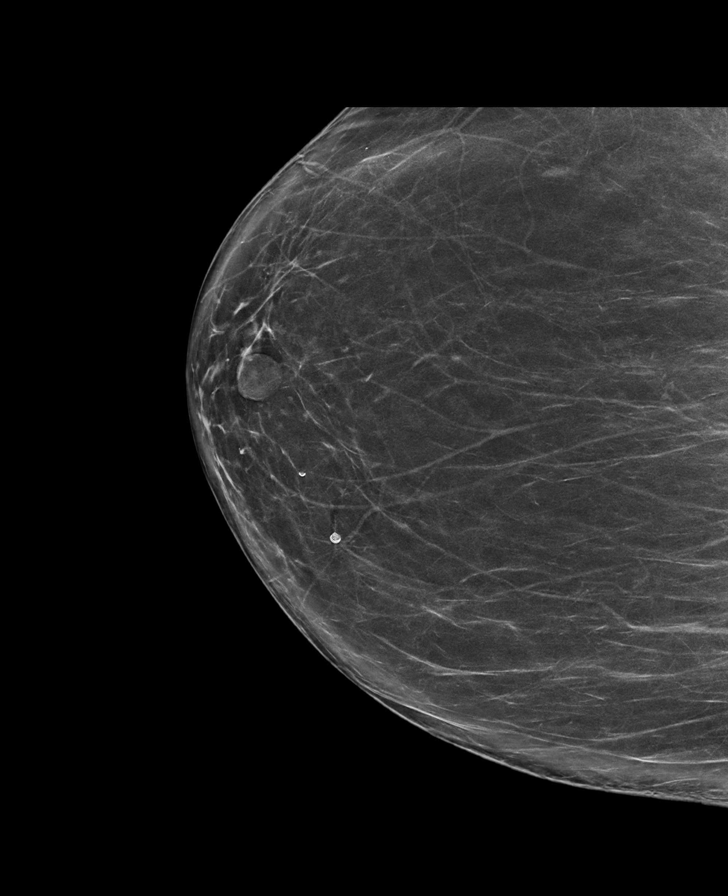

[R MLO synth-2D (1 of 2)]
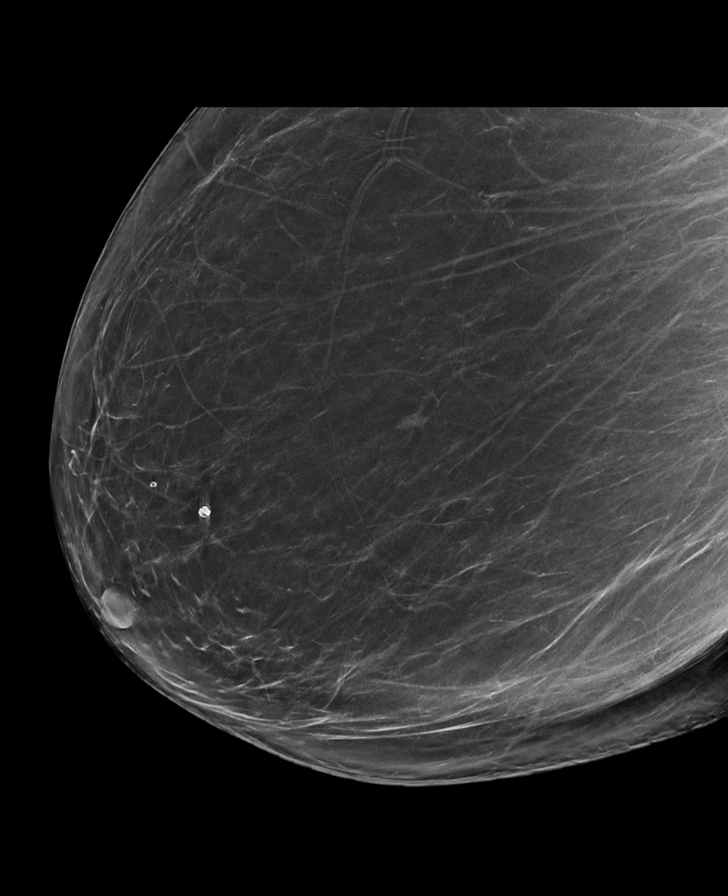

[R MLO synth-2D (2 of 2)]
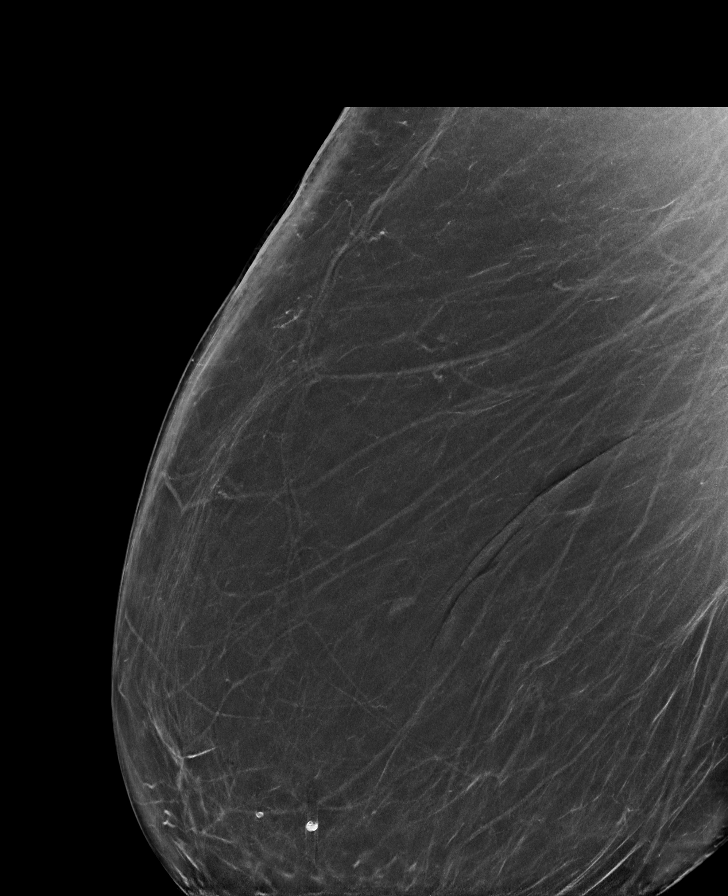

[R MLO tomo · tomo slice 46/91.0]
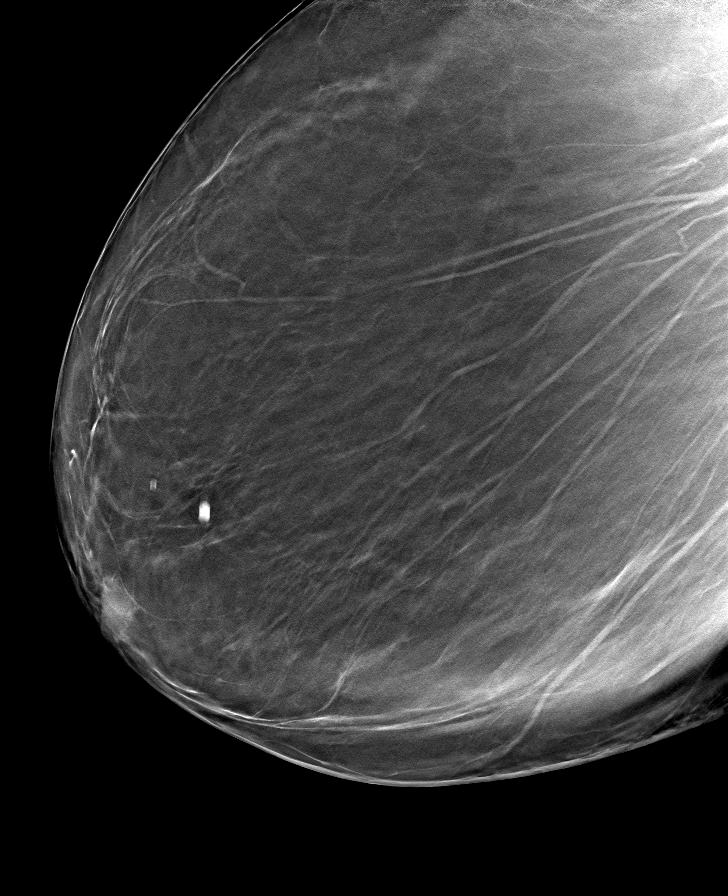

[8 of 40 positions shown; findings below may reference images not displayed]

FINDINGS: There are no findings suspicious for malignancy.
IMPRESSION: No mammographic evidence of malignancy. A result letter of this
screening mammogram will be mailed directly to the patient.

RECOMMENDATION:
Screening mammogram in one year. (Code:0E-3-N98)

BI-RADS CATEGORY  1: Negative.

## 2022-03-06 DIAGNOSIS — M5451 Vertebrogenic low back pain: Secondary | ICD-10-CM | POA: Diagnosis not present

## 2022-03-10 DIAGNOSIS — M5451 Vertebrogenic low back pain: Secondary | ICD-10-CM | POA: Diagnosis not present

## 2022-03-11 ENCOUNTER — Ambulatory Visit: Payer: Medicare Other | Admitting: Podiatry

## 2022-03-17 ENCOUNTER — Ambulatory Visit (INDEPENDENT_AMBULATORY_CARE_PROVIDER_SITE_OTHER): Payer: Medicare Other | Admitting: Podiatry

## 2022-03-17 ENCOUNTER — Encounter: Payer: Self-pay | Admitting: Podiatry

## 2022-03-17 DIAGNOSIS — M79674 Pain in right toe(s): Secondary | ICD-10-CM | POA: Diagnosis not present

## 2022-03-17 DIAGNOSIS — M79675 Pain in left toe(s): Secondary | ICD-10-CM | POA: Diagnosis not present

## 2022-03-17 DIAGNOSIS — R7303 Prediabetes: Secondary | ICD-10-CM

## 2022-03-17 DIAGNOSIS — B351 Tinea unguium: Secondary | ICD-10-CM

## 2022-03-17 MED ORDER — TERBINAFINE HCL 250 MG PO TABS: 250.0000 mg | ORAL_TABLET | Freq: Every day | ORAL | 0 refills | Status: DC

## 2022-03-17 NOTE — Progress Notes (Signed)
?  Subjective:  ?Patient ID: Amy Jarvis, female    DOB: Apr 15, 1961,   MRN: [id number] ? ?No chief complaint on file. ? ? ?61 y.o. female presents for follow-up of left great toenail fungus. Has been taking the Lamisil. Relates it may have been helping a little. Finished up course a month ago. Hoping to have nails trimmed today. She is pre-diabetic and on medication. Last A1c was 5.9.   . Denies any other pedal complaints. Denies n/v/f/c.  ? ?PCP: Harvie Heck MD  ? ?Past Medical History:  ?Diagnosis Date  ? Acute sinusitis 01/20/2011  ? Qualifier: Diagnosis of  By: Alvy Beal    ? Allergic rhinitis 09/23/2015  ? Carpal tunnel syndrome, bilateral   ? Cervical polyp   ? possible history of cervical polyp  ? Complication of anesthesia   ? low bp after hysterctomy  ? Cystocele 01/13/2010  ? DDD (degenerative disc disease), lumbar   ? Degenerative disk disease 10/07/2012  ? Degenerative lumbar spinal stenosis   ? Diverticulosis 03/26/2010  ? GERD 09/05/2009  ? Hyperlipidemia LDL goal < 130 09/25/2008  ? Hypertension 09/25/2008  ? Microscopic hematuria 01/13/2010  ? Morbid obesity 08/28/2010  ? Obstructive sleep apnea on CPAP 06/20/2010  ? cpap   ? Plantar fasciitis, bilateral 09/23/2016  ? Pneumonia   ? 6/16  ? Uterine prolapse 11/19/2008  ? Vertigo 10/07/2012  ? ? ?Objective:  ?Physical Exam: ?Vascular: DP/PT pulses 2/4 bilateral. CFT <3 seconds. Normal hair growth on digits. No edema.  ?Skin. No lacerations or abrasions bilateral feet. Nails 1-5 are thickened discolored and elongated with subungual debris. Left hallux nail the worst.  ?Musculoskeletal: MMT 5/5 bilateral lower extremities in DF, PF, Inversion and Eversion. Deceased ROM in DF of ankle joint. No tenderness currently to medial calcaneal tubercle.  ?Neurological: Sensation intact to light touch.  ? ?Assessment:  ? ?1. Onychomycosis   ? ? ? ? ?Plan:  ?Patient was evaluated and treated and all questions answered. ?-Examined patient ?-Discussed  treatment options for painful dystrophic nails  ?- Will do one more month of lamisil ?-Discussed and educated patient on diabetic foot care, especially with  ?regards to the vascular, neurological and musculoskeletal systems.  ?-Stressed the importance of good glycemic control and the detriment of not  ?controlling glucose levels in relation to the foot. ?-Discussed supportive shoes at all times and checking feet regularly.  ?-Mechanically debrided all nails 1-5 bilateral using sterile nail nipper and filed with dremel without incident  ?-Answered all patient questions ?-Patient to return  in 3 months for at risk foot care ?-Patient advised to call the office if any problems or questions arise in the meantime. ? ?Lorenda Peck, DPM  ? ? ?

## 2022-03-19 ENCOUNTER — Telehealth: Payer: Self-pay | Admitting: *Deleted

## 2022-03-19 MED ORDER — TERBINAFINE HCL 250 MG PO TABS: 250.0000 mg | ORAL_TABLET | Freq: Every day | ORAL | 0 refills | Status: DC

## 2022-03-19 NOTE — Telephone Encounter (Signed)
Patient is calling because the pharmacy never received prescription for lamisil.  ?Resent the prescription to pharmacy on file. ?

## 2022-03-23 ENCOUNTER — Other Ambulatory Visit: Payer: Self-pay

## 2022-03-23 DIAGNOSIS — J019 Acute sinusitis, unspecified: Secondary | ICD-10-CM

## 2022-03-25 ENCOUNTER — Other Ambulatory Visit: Payer: Self-pay | Admitting: Podiatry

## 2022-03-25 MED ORDER — TERBINAFINE HCL 250 MG PO TABS: 250.0000 mg | ORAL_TABLET | Freq: Every day | ORAL | 0 refills | Status: AC

## 2022-03-25 NOTE — Telephone Encounter (Signed)
Pharmacy -  [facility] on [address]    Medication : Lamisil  - prescription says print   pharmacy did not get it.

## 2022-03-25 NOTE — Telephone Encounter (Signed)
Will resent  ?

## 2022-04-13 DIAGNOSIS — G4733 Obstructive sleep apnea (adult) (pediatric): Secondary | ICD-10-CM | POA: Diagnosis not present

## 2022-04-29 ENCOUNTER — Other Ambulatory Visit: Payer: Self-pay | Admitting: *Deleted

## 2022-04-29 MED ORDER — SEMAGLUTIDE (2 MG/DOSE) 8 MG/3ML ~~LOC~~ SOPN: 2.0000 mg | PEN_INJECTOR | SUBCUTANEOUS | 2 refills | Status: DC

## 2022-04-30 ENCOUNTER — Other Ambulatory Visit: Payer: Self-pay | Admitting: Internal Medicine

## 2022-04-30 DIAGNOSIS — R42 Dizziness and giddiness: Secondary | ICD-10-CM

## 2022-05-04 MED ORDER — MECLIZINE HCL 25 MG PO TABS
25.0000 mg | ORAL_TABLET | Freq: Three times a day (TID) | ORAL | 0 refills | Status: DC | PRN
Start: 2022-05-04 — End: 2022-07-08

## 2022-05-12 DIAGNOSIS — M5416 Radiculopathy, lumbar region: Secondary | ICD-10-CM | POA: Diagnosis not present

## 2022-05-25 DIAGNOSIS — S90561A Insect bite (nonvenomous), right ankle, initial encounter: Secondary | ICD-10-CM | POA: Diagnosis not present

## 2022-05-25 DIAGNOSIS — W57XXXA Bitten or stung by nonvenomous insect and other nonvenomous arthropods, initial encounter: Secondary | ICD-10-CM | POA: Diagnosis not present

## 2022-05-28 DIAGNOSIS — M5451 Vertebrogenic low back pain: Secondary | ICD-10-CM | POA: Diagnosis not present

## 2022-06-08 ENCOUNTER — Other Ambulatory Visit (INDEPENDENT_AMBULATORY_CARE_PROVIDER_SITE_OTHER): Payer: Medicare Other | Admitting: Internal Medicine

## 2022-06-08 ENCOUNTER — Encounter: Payer: Self-pay | Admitting: Student

## 2022-06-08 DIAGNOSIS — Z9989 Dependence on other enabling machines and devices: Secondary | ICD-10-CM

## 2022-06-08 DIAGNOSIS — G4733 Obstructive sleep apnea (adult) (pediatric): Secondary | ICD-10-CM

## 2022-06-08 NOTE — Progress Notes (Signed)
Order placed for CPAP per prior study.

## 2022-06-16 ENCOUNTER — Encounter: Payer: Self-pay | Admitting: Student

## 2022-06-17 ENCOUNTER — Ambulatory Visit: Payer: Medicare Other | Admitting: Podiatry

## 2022-06-18 DIAGNOSIS — M5451 Vertebrogenic low back pain: Secondary | ICD-10-CM | POA: Diagnosis not present

## 2022-07-01 ENCOUNTER — Encounter: Payer: Medicare Other | Admitting: Student

## 2022-07-08 ENCOUNTER — Encounter (INDEPENDENT_AMBULATORY_CARE_PROVIDER_SITE_OTHER): Payer: Self-pay

## 2022-07-08 ENCOUNTER — Encounter: Payer: Self-pay | Admitting: Student

## 2022-07-08 ENCOUNTER — Ambulatory Visit (INDEPENDENT_AMBULATORY_CARE_PROVIDER_SITE_OTHER): Payer: Medicare Other | Admitting: Student

## 2022-07-08 ENCOUNTER — Other Ambulatory Visit: Payer: Self-pay

## 2022-07-08 VITALS — BP 163/74 | HR 90 | Temp 98.2°F | Ht 66.0 in | Wt 325.1 lb

## 2022-07-08 DIAGNOSIS — I10 Essential (primary) hypertension: Secondary | ICD-10-CM | POA: Diagnosis not present

## 2022-07-08 DIAGNOSIS — R7303 Prediabetes: Secondary | ICD-10-CM | POA: Diagnosis not present

## 2022-07-08 DIAGNOSIS — I700 Atherosclerosis of aorta: Secondary | ICD-10-CM | POA: Insufficient documentation

## 2022-07-08 DIAGNOSIS — G4733 Obstructive sleep apnea (adult) (pediatric): Secondary | ICD-10-CM | POA: Diagnosis not present

## 2022-07-08 DIAGNOSIS — E785 Hyperlipidemia, unspecified: Secondary | ICD-10-CM | POA: Diagnosis not present

## 2022-07-08 DIAGNOSIS — Z6841 Body Mass Index (BMI) 40.0 and over, adult: Secondary | ICD-10-CM

## 2022-07-08 DIAGNOSIS — R42 Dizziness and giddiness: Secondary | ICD-10-CM

## 2022-07-08 DIAGNOSIS — K219 Gastro-esophageal reflux disease without esophagitis: Secondary | ICD-10-CM

## 2022-07-08 DIAGNOSIS — Z9989 Dependence on other enabling machines and devices: Secondary | ICD-10-CM | POA: Diagnosis not present

## 2022-07-08 DIAGNOSIS — J019 Acute sinusitis, unspecified: Secondary | ICD-10-CM

## 2022-07-08 LAB — GLUCOSE, CAPILLARY: Glucose-Capillary: 85 mg/dL (ref 70–99)

## 2022-07-08 LAB — POCT GLYCOSYLATED HEMOGLOBIN (HGB A1C): Hemoglobin A1C: 5.7 % — AB (ref 4.0–5.6)

## 2022-07-08 MED ORDER — CHLORTHALIDONE 25 MG PO TABS: 25.0000 mg | ORAL_TABLET | Freq: Every day | ORAL | 3 refills | Status: DC

## 2022-07-08 MED ORDER — SEMAGLUTIDE (2 MG/DOSE) 8 MG/3ML ~~LOC~~ SOPN: 2.0000 mg | PEN_INJECTOR | SUBCUTANEOUS | 2 refills | Status: DC

## 2022-07-08 MED ORDER — AMLODIPINE-OLMESARTAN 10-40 MG PO TABS: 1.0000 | ORAL_TABLET | Freq: Every day | ORAL | 3 refills | Status: DC

## 2022-07-08 MED ORDER — OMEPRAZOLE 40 MG PO CPDR: DELAYED_RELEASE_CAPSULE | ORAL | 1 refills | Status: DC

## 2022-07-08 MED ORDER — AZELASTINE HCL 0.1 % NA SOLN: NASAL | 1 refills | Status: DC

## 2022-07-08 MED ORDER — MECLIZINE HCL 25 MG PO TABS: 25.0000 mg | ORAL_TABLET | Freq: Three times a day (TID) | ORAL | 0 refills | Status: DC | PRN

## 2022-07-08 MED ORDER — LORATADINE 10 MG PO CAPS: 10.0000 mg | ORAL_CAPSULE | Freq: Every day | ORAL | 3 refills | Status: DC

## 2022-07-08 NOTE — Progress Notes (Signed)
Subjective:  CC: follow up  HPI:  Amy Jarvis is a 61 y.o. female with a past medical history stated below and presents today for blood pressure follow up. Please see problem based assessment and plan for additional details.  Past Medical History:  Diagnosis Date   Acute sinusitis 01/20/2011   Qualifier: Diagnosis of  By: Marius Ditch RN, Theresa     Allergic rhinitis 09/23/2015   Carpal tunnel syndrome, bilateral    Cervical polyp    possible history of cervical polyp   Complication of anesthesia    low bp after hysterctomy   Cystocele 01/13/2010   DDD (degenerative disc disease), lumbar    Degenerative disk disease 10/07/2012   Degenerative lumbar spinal stenosis    Diverticulosis 03/26/2010   GERD 09/05/2009   Hyperlipidemia LDL goal < 130 09/25/2008   Hypertension 09/25/2008   Microscopic hematuria 01/13/2010   Morbid obesity 08/28/2010   Obstructive sleep apnea on CPAP 06/20/2010   cpap    Plantar fasciitis, bilateral 09/23/2016   Pneumonia    6/16   Uterine prolapse 11/19/2008   Vertigo 10/07/2012    Current Outpatient Medications on File Prior to Visit  Medication Sig Dispense Refill   Accu-Chek Softclix Lancets lancets Use as instructed 100 each 12   acetaminophen (TYLENOL) 500 MG tablet Take 2 tablets (1,000 mg total) by mouth every 8 (eight) hours as needed for mild pain or moderate pain. 90 tablet 0   diclofenac Sodium (VOLTAREN) 1 % GEL Apply 2 g topically 4 (four) times daily. 100 g 0   ezetimibe (ZETIA) 10 MG tablet Take 1 tablet (10 mg total) by mouth daily. 90 tablet 3   gabapentin (NEURONTIN) 300 MG capsule Take '300mg'$  in am and '600mg'$  in pm prior to bedtime 90 capsule 0   glucose blood (ACCU-CHEK GUIDE) test strip Use as instructed 100 each 12   hydrocortisone 2.5 % ointment Please apply to affected area. 454 g 0   Meloxicam 15 MG TBDP Take 15 mg by mouth daily as needed. 7 tablet 0   omega-3 acid ethyl esters (LOVAZA) 1 g capsule Take 1 g by  mouth.     rosuvastatin (CRESTOR) 10 MG tablet Take 1 tablet by mouth daily.     spironolactone (ALDACTONE) 25 MG tablet Take 1 tablet (25 mg total) by mouth daily. 90 tablet 3   venlafaxine XR (EFFEXOR-XR) 75 MG 24 hr capsule Take 75 mg by mouth at bedtime.   0   vitamin C (ASCORBIC ACID) 500 MG tablet Take 500 mg by mouth daily.     No current facility-administered medications on file prior to visit.    Family History  Problem Relation Age of Onset   Colon cancer Mother 78   Hypertension Mother    Stroke Mother 66   Hypertension Sister    Cancer Brother        kidney   Kidney disease Brother    Prostate cancer Brother    Glaucoma Brother    Hypertension Brother    Hypertension Daughter    Hypertension Son    Breast cancer Other        maternal great-aunt   Cancer Other        maternal great aunt with breast cancer    Social History   Socioeconomic History   Marital status: Divorced    Spouse name: Not on file   Number of children: 3   Years of education: Not on file   Highest  education level: Some college, no degree  Occupational History    Employer: UNEMPLOYED  Tobacco Use   Smoking status: Never   Smokeless tobacco: Never  Vaping Use   Vaping Use: Never used  Substance and Sexual Activity   Alcohol use: No    Alcohol/week: 0.0 standard drinks of alcohol   Drug use: No   Sexual activity: Not Currently  Other Topics Concern   Not on file  Social History Narrative   Current Social History 09/18/2021        Patient lives alone in an home which is 1 story. There are not steps up to the entrance the patient uses.       Patient's method of transportation is personal car.      The highest level of education was some college.      The patient currently disabled.      Identified important Relationships are "my lord and savior Jesus christ and my children"       Pets : 0       Interests / Fun: "Spend time with my grandchildren, reading the bible, bible  study, church"       Current Stressors: "my general health"      Religious / Personal Beliefs: "Holiness"    Social Determinants of Health   Financial Resource Strain: Not on file  Food Insecurity: Not on file  Transportation Needs: Not on file  Physical Activity: Not on file  Stress: Not on file  Social Connections: Not on file  Intimate Partner Violence: Not on file    Review of Systems: ROS negative except for what is noted on the assessment and plan.  Objective:   Vitals:   07/08/22 1430 07/08/22 1443  BP: (!) 192/86 (!) 163/74  Pulse: (!) 102 90  Temp: 98.2 F (36.8 C)   TempSrc: Oral   SpO2: 100%   Weight: (!) 325 lb 1.6 oz (147.5 kg)   Height: '5\' 6"'$  (1.676 m)     Physical Exam: Constitutional: well-appearing person sitting in examination chair, in no acute distress HENT: normocephalic atraumatic, mucous membranes moist Eyes: conjunctiva non-erythematous Neck: supple Cardiovascular: regular rate and rhythm, no m/r/g.  Radial and DP pulses 2+ and symmetrical Pulmonary/Chest: normal work of breathing on room air, lungs clear to auscultation bilaterally Abdominal: soft, non-tender, non-distended MSK: normal bulk and tone.  No lower extremity edema Neurological: alert & oriented x 3, 5/5 strength in bilateral upper and lower extremities, normal gait Skin: warm and dry Psych: Appropriate mood and affect   Assessment & Plan:   Obstructive sleep apnea on CPAP CPAP since 2008; last sleep study in 2008.  CPAP pressure configurations 7-10.  Patient has had same machine since 2008 and she is compliant with nightly use. Without it patient says that becomes somnolent, pauses breathing, recurrent nocturnal waking.  Patient continues to benefit from CPAP machine, though patient has had it for longer than the usual CPAP machine lifetime and could explain patient's continued increase in blood pressure and weight gain despite lifestyle modifications and medical therapy.   Patient will benefit from auto PAP machine to assess appropriate CPAP pressure. -Placed ordered for AutoPAP machine     Essential hypertension Vitals:   07/08/22 1430 07/08/22 1443  BP: (!) 192/86 (!) 163/74   Patient returning for hypertension follow-up today, reports compliance with current medications.  SBP today up to 192, above goal.  Patient's BP was above goal 5 months ago.  Patient does not take blood pressure  at home, but notes that her BP has not been this high visits with other providers.  She denies headaches, lightheadedness, changes in vision, chest pain or shortness of breath today.  She was alert and oriented today her CV exam was unremarkable.  Her extremities were well-perfused, no evidence of edema, warm and dry.  Patient had previously tried combination medication with hydrochlorothiazide but reported peripheral edema but with this drug.  Discussed with patient adding chlorthalidone today; patient is worried about the number of medications she is taking currently but amenable to trying this new medication.  She mentioned that if this does not work with her BP, she would like to be referred to cardiology.  Advised patient to continue lifestyle modifications as reduction of her weight could also help her BP. -Continue olmesartan-amlodipine 40-10 daily -Continue spironolactone 25 mg daily -Start chlorthalidone 25 mg daily -BMP today   Morbid obesity with BMI of 50.0-59.9, adult [facility]) Patient has been on Ozempic since 09/2021.  Patient estimates she has lost about 10 pounds overall since starting therapy, with initial decrease in appetite but that these effect has waned over the past few months.  She continues to watch what she eats and mentions that it has been difficult to engage in regular physical therapy given her chronic pain.  Patient admits it is difficult because she knows she needs surgery for her back pain but that this is risky given her morbid obesity.  Patient is  compliant with Ozempic therapy, maxed out at 2 milligrams weekly.  She continues to deny side effects from this medication.  Patient would like referral to weight management clinic to explore resources to better manage her weight at this time. -Referral to weight management clinic -Continue Ozempic weekly  Prediabetes Last A1c was 5.8, given weight gain, concern for worsening.  Patient denies polydipsia, polyuria is difficult to assess given diuretic use.  No changes in vision.  Patient has been compliant on Ozempic weekly -Repeat A1c today   Patient seen with Dr. Angelia Mould

## 2022-07-08 NOTE — Patient Instructions (Signed)
Thank you, Ms.Marylene Buerger for allowing [name] to provide your care today. Today we discussed:  Sleep Apnea - we have placed an order for an AutoPAP for your sleep apnea - Continue using as we discussed  Blood pressure It is still higher than we want it. The goal blood pressure is less than 130/80. We believe you would benefit from adding another medication to lower your blood pressure and overall risk of a cardiac event. - We added Chlorthalidone. Take one 25 mg pill daily. - Continue the spironolactone and the Olmesartan-amlodipine pills as you had  Weight - Referral to the weight management clinic to help you find accomodation to your current physical status and make a plan to lose more weight and keep you mobile, especially with your high blood pressure and your back pain.    I have ordered the following labs for you:  Lab Orders         BMP8+Anion Gap         Lipid Profile         POC Hbg A1C      I will call if any are abnormal. All of your labs can be accessed through "My Chart".  My Chart Access: [email or web address]  Please follow-up in in 4 weeks.  Please make sure to arrive 15 minutes prior to your next appointment. If you arrive late, you may be asked to reschedule.    We look forward to seeing you next time. Please call our clinic at [phone] if you have any questions or concerns. The best time to call is Monday-Friday from 9am-4pm, but there is someone available 24/7. If after hours or the weekend, call the main hospital number and ask for the Internal Medicine Resident On-Call. If you need medication refills, please notify your pharmacy one week in advance and they will send [name] a request.   Thank you for letting us take part in your care. Wishing you the best!  Romana Juniper, MD 07/08/2022, 3:43 PM [facility] Resident, PGY-1

## 2022-07-08 NOTE — Assessment & Plan Note (Addendum)
CPAP since 2008; last sleep study in 2008.  CPAP pressure configurations 7-10.  Patient has had same machine since 2008 and she is compliant with nightly use. Without it patient says that becomes somnolent, pauses breathing, recurrent nocturnal waking.  Patient continues to benefit from CPAP machine, though patient has had it for longer than the usual CPAP machine lifetime and could explain patient's continued increase in blood pressure and weight gain despite lifestyle modifications and medical therapy.  Patient will benefit from auto PAP machine to assess appropriate CPAP pressure. -Placed ordered for AutoPAP machine

## 2022-07-08 NOTE — Assessment & Plan Note (Addendum)
Vitals:   07/08/22 1430 07/08/22 1443  BP: (!) 192/86 (!) 163/74   Patient returning for hypertension follow-up today, reports compliance with current medications.  SBP today up to 192, above goal.  Patient's BP was above goal 5 months ago.  Patient does not take blood pressure at home, but notes that her BP has not been this high visits with other providers.  She denies headaches, lightheadedness, changes in vision, chest pain or shortness of breath today.  She was alert and oriented today her CV exam was unremarkable.  Her extremities were well-perfused, no evidence of edema, warm and dry.  Patient had previously tried combination medication with hydrochlorothiazide but reported peripheral edema but with this drug.  Discussed with patient adding chlorthalidone today; patient is worried about the number of medications she is taking currently but amenable to trying this new medication.  She mentioned that if this does not work with her BP, she would like to be referred to cardiology.  Advised patient to continue lifestyle modifications as reduction of her weight could also help her BP. -Continue olmesartan-amlodipine 40-10 daily -Continue spironolactone 25 mg daily -Start chlorthalidone 25 mg daily -BMP today

## 2022-07-08 NOTE — Assessment & Plan Note (Signed)
Patient has been on Ozempic since 09/2021.  Patient estimates she has lost about 10 pounds overall since starting therapy, with initial decrease in appetite but that these effect has waned over the past few months.  She continues to watch what she eats and mentions that it has been difficult to engage in regular physical therapy given her chronic pain.  Patient admits it is difficult because she knows she needs surgery for her back pain but that this is risky given her morbid obesity.  Patient is compliant with Ozempic therapy, maxed out at 2 milligrams weekly.  She continues to deny side effects from this medication.  Patient would like referral to weight management clinic to explore resources to better manage her weight at this time. -Referral to weight management clinic -Continue Ozempic weekly

## 2022-07-08 NOTE — Assessment & Plan Note (Addendum)
Last A1c was 5.8, given weight gain, concern for worsening.  Patient denies polydipsia, polyuria is difficult to assess given diuretic use.  No changes in vision.  Patient has been compliant on Ozempic weekly -Repeat A1c today

## 2022-07-09 LAB — BMP8+ANION GAP
Anion Gap: 19 mmol/L — ABNORMAL HIGH (ref 10.0–18.0)
BUN/Creatinine Ratio: 13 (ref 12–28)
BUN: 9 mg/dL (ref 8–27)
CO2: 22 mmol/L (ref 20–29)
Calcium: 10 mg/dL (ref 8.7–10.3)
Chloride: 102 mmol/L (ref 96–106)
Creatinine, Ser: 0.71 mg/dL (ref 0.57–1.00)
Glucose: 82 mg/dL (ref 70–99)
Potassium: 4.6 mmol/L (ref 3.5–5.2)
Sodium: 143 mmol/L (ref 134–144)
eGFR: 97 mL/min/{1.73_m2} (ref >59)

## 2022-07-09 LAB — LIPID PANEL
Chol/HDL Ratio: 4.3 ratio (ref 0.0–4.4)
Cholesterol, Total: 255 mg/dL — ABNORMAL HIGH (ref 100–199)
HDL: 60 mg/dL (ref >39)
LDL Chol Calc (NIH): 174 mg/dL — ABNORMAL HIGH (ref 0–99)
Triglycerides: 119 mg/dL (ref 0–149)
VLDL Cholesterol Cal: 21 mg/dL (ref 5–40)

## 2022-07-10 NOTE — Progress Notes (Signed)
Patient called.  Discussed with patient that her A1c is unchanged slightly better from prior, which still is in the pre-diabetic range. Highlighted the importance of lifestyle modifications and work with the Weight Management clinic for weight management plan as patient is on max dose of Ozempic for weight loss.   Regarding her lipid panel, Ms. Purdy mentioned that she is not taking a statin medication as she experienced intense muscle pain. She has been taking Zetia every other day for quite sometime now. Discussed her ASCVD risk is 16.3% with her current values and medication, and explained that there is still room for improvement.  Discussed the likelihood of needing to increase the current dose of antihypertensives if blood pressure is not at goal. Also discussed the need for better control of cholesterol with additional medications. Patient would also benefit of Aspirin therapy; she currently has low risk of bleeding.

## 2022-07-10 NOTE — Progress Notes (Signed)
Internal Medicine Clinic Attending  I saw and evaluated the patient.  I personally confirmed the key portions of the history and exam documented by the resident  and I reviewed pertinent patient test results.  The assessment, diagnosis, and plan were formulated together and I agree with the documentation in the resident's note.  

## 2022-07-14 ENCOUNTER — Other Ambulatory Visit: Payer: Self-pay | Admitting: Internal Medicine

## 2022-07-14 DIAGNOSIS — M722 Plantar fascial fibromatosis: Secondary | ICD-10-CM

## 2022-07-14 MED ORDER — DICLOFENAC SODIUM 1 % EX GEL: 2.0000 g | Freq: Four times a day (QID) | CUTANEOUS | 3 refills | Status: DC

## 2022-07-15 ENCOUNTER — Telehealth: Payer: Self-pay | Admitting: *Deleted

## 2022-07-15 NOTE — Telephone Encounter (Signed)
RE: CPAP Received: Leticia Clas, [place]; Clovis Fredrickson; 1 other Received, Thank you!        Previous Messages    ----- Message -----  From: Marcelino Duster, CMA  Sent: 07/13/2022  11:19 AM EDT  To: Darlina Guys; Alyse Low; Miquel Dunn; *  Subject: CPAP                                           Good morning all!  We had placed a cpap order earlier but pt needed recent notes.  Just wanted to make you aware that she had a recent visit and would like to continue process . Please let us know if you need additional information.   Daryel Kenneth "[facility]  [phone]

## 2022-07-15 NOTE — Telephone Encounter (Signed)
Error message

## 2022-07-23 DIAGNOSIS — G4733 Obstructive sleep apnea (adult) (pediatric): Secondary | ICD-10-CM | POA: Diagnosis not present

## 2022-07-23 DIAGNOSIS — M5451 Vertebrogenic low back pain: Secondary | ICD-10-CM | POA: Diagnosis not present

## 2022-07-29 DIAGNOSIS — L02211 Cutaneous abscess of abdominal wall: Secondary | ICD-10-CM | POA: Diagnosis not present

## 2022-08-07 ENCOUNTER — Encounter: Payer: Medicare Other | Admitting: Student

## 2022-08-23 DIAGNOSIS — M5451 Vertebrogenic low back pain: Secondary | ICD-10-CM | POA: Diagnosis not present

## 2022-08-23 DIAGNOSIS — G4733 Obstructive sleep apnea (adult) (pediatric): Secondary | ICD-10-CM | POA: Diagnosis not present

## 2022-08-26 ENCOUNTER — Encounter: Payer: Self-pay | Admitting: *Deleted

## 2022-08-26 NOTE — Progress Notes (Signed)
[facility] Quality Team Note  Name: Amy Jarvis Date of Birth: 1961/11/29 MRN: [id number] Date: 08/26/2022  [facility] Quality Team has reviewed this patient's chart, please see recommendations below:  AWV;  Diabetic Retinal Eye Exam;  [facility] Quality Other; (Pt has open gaps for diabetic eye exam and AWV.  )

## 2022-09-21 ENCOUNTER — Other Ambulatory Visit: Payer: Self-pay | Admitting: Internal Medicine

## 2022-09-21 DIAGNOSIS — L309 Dermatitis, unspecified: Secondary | ICD-10-CM

## 2022-09-21 MED ORDER — HYDROCORTISONE 2.5 % EX OINT: TOPICAL_OINTMENT | CUTANEOUS | 0 refills | Status: DC

## 2022-09-21 NOTE — Telephone Encounter (Signed)
Thanks Lauren!

## 2022-09-22 DIAGNOSIS — M5451 Vertebrogenic low back pain: Secondary | ICD-10-CM | POA: Diagnosis not present

## 2022-09-22 DIAGNOSIS — G4733 Obstructive sleep apnea (adult) (pediatric): Secondary | ICD-10-CM | POA: Diagnosis not present

## 2022-10-14 ENCOUNTER — Other Ambulatory Visit: Payer: Self-pay | Admitting: Student

## 2022-10-21 ENCOUNTER — Other Ambulatory Visit: Payer: Self-pay | Admitting: Student in an Organized Health Care Education/Training Program

## 2022-10-21 DIAGNOSIS — Z1231 Encounter for screening mammogram for malignant neoplasm of breast: Secondary | ICD-10-CM

## 2022-10-23 DIAGNOSIS — G4733 Obstructive sleep apnea (adult) (pediatric): Secondary | ICD-10-CM | POA: Diagnosis not present

## 2022-10-23 DIAGNOSIS — M5451 Vertebrogenic low back pain: Secondary | ICD-10-CM | POA: Diagnosis not present

## 2022-10-26 ENCOUNTER — Ambulatory Visit: Payer: Medicare Other

## 2022-10-26 DIAGNOSIS — G4733 Obstructive sleep apnea (adult) (pediatric): Secondary | ICD-10-CM | POA: Diagnosis not present

## 2022-11-02 ENCOUNTER — Encounter: Payer: Medicare Other | Admitting: Student

## 2022-11-12 DIAGNOSIS — H40013 Open angle with borderline findings, low risk, bilateral: Secondary | ICD-10-CM | POA: Diagnosis not present

## 2022-11-12 LAB — HM DIABETES EYE EXAM

## 2022-11-13 DIAGNOSIS — L03114 Cellulitis of left upper limb: Secondary | ICD-10-CM | POA: Diagnosis not present

## 2022-11-13 DIAGNOSIS — R030 Elevated blood-pressure reading, without diagnosis of hypertension: Secondary | ICD-10-CM | POA: Diagnosis not present

## 2022-11-22 DIAGNOSIS — M5451 Vertebrogenic low back pain: Secondary | ICD-10-CM | POA: Diagnosis not present

## 2022-11-22 DIAGNOSIS — G4733 Obstructive sleep apnea (adult) (pediatric): Secondary | ICD-10-CM | POA: Diagnosis not present

## 2022-11-25 DIAGNOSIS — G4733 Obstructive sleep apnea (adult) (pediatric): Secondary | ICD-10-CM | POA: Diagnosis not present

## 2022-12-17 ENCOUNTER — Ambulatory Visit: Payer: 59

## 2022-12-17 ENCOUNTER — Encounter: Payer: Medicare Other | Admitting: Student

## 2022-12-21 ENCOUNTER — Ambulatory Visit
Admission: RE | Admit: 2022-12-21 | Discharge: 2022-12-21 | Disposition: A | Discharge status: Home / Self Care | Payer: 59 | Source: Ambulatory Visit | Attending: Student in an Organized Health Care Education/Training Program | Admitting: Student in an Organized Health Care Education/Training Program

## 2022-12-21 DIAGNOSIS — Z1231 Encounter for screening mammogram for malignant neoplasm of breast: Secondary | ICD-10-CM | POA: Diagnosis not present

## 2022-12-23 DIAGNOSIS — M5451 Vertebrogenic low back pain: Secondary | ICD-10-CM | POA: Diagnosis not present

## 2022-12-23 DIAGNOSIS — G4733 Obstructive sleep apnea (adult) (pediatric): Secondary | ICD-10-CM | POA: Diagnosis not present

## 2022-12-23 NOTE — Progress Notes (Signed)
Patient called, patient aware.

## 2022-12-26 DIAGNOSIS — G4733 Obstructive sleep apnea (adult) (pediatric): Secondary | ICD-10-CM | POA: Diagnosis not present

## 2022-12-28 ENCOUNTER — Ambulatory Visit (INDEPENDENT_AMBULATORY_CARE_PROVIDER_SITE_OTHER): Payer: 59 | Admitting: Student

## 2022-12-28 ENCOUNTER — Other Ambulatory Visit: Payer: Self-pay

## 2022-12-28 ENCOUNTER — Encounter: Payer: Self-pay | Admitting: Student

## 2022-12-28 VITALS — BP 154/79 | HR 85 | Temp 97.8°F | Ht 67.0 in | Wt 326.6 lb

## 2022-12-28 DIAGNOSIS — Z0000 Encounter for general adult medical examination without abnormal findings: Secondary | ICD-10-CM

## 2022-12-28 DIAGNOSIS — I10 Essential (primary) hypertension: Secondary | ICD-10-CM | POA: Diagnosis not present

## 2022-12-28 DIAGNOSIS — G4733 Obstructive sleep apnea (adult) (pediatric): Secondary | ICD-10-CM | POA: Diagnosis not present

## 2022-12-28 DIAGNOSIS — Z23 Encounter for immunization: Secondary | ICD-10-CM | POA: Diagnosis not present

## 2022-12-28 DIAGNOSIS — L309 Dermatitis, unspecified: Secondary | ICD-10-CM

## 2022-12-28 DIAGNOSIS — E785 Hyperlipidemia, unspecified: Secondary | ICD-10-CM

## 2022-12-28 DIAGNOSIS — R7303 Prediabetes: Secondary | ICD-10-CM

## 2022-12-28 DIAGNOSIS — Z1211 Encounter for screening for malignant neoplasm of colon: Secondary | ICD-10-CM

## 2022-12-28 DIAGNOSIS — Z9989 Dependence on other enabling machines and devices: Secondary | ICD-10-CM

## 2022-12-28 DIAGNOSIS — Z6841 Body Mass Index (BMI) 40.0 and over, adult: Secondary | ICD-10-CM

## 2022-12-28 LAB — POCT GLYCOSYLATED HEMOGLOBIN (HGB A1C): Hemoglobin A1C: 5.7 % — AB (ref 4.0–5.6)

## 2022-12-28 LAB — GLUCOSE, CAPILLARY: Glucose-Capillary: 87 mg/dL (ref 70–99)

## 2022-12-28 MED ORDER — OZEMPIC (2 MG/DOSE) 8 MG/3ML ~~LOC~~ SOPN: PEN_INJECTOR | SUBCUTANEOUS | 2 refills | Status: DC

## 2022-12-28 MED ORDER — HYDROCORTISONE 2.5 % EX OINT: TOPICAL_OINTMENT | CUTANEOUS | 0 refills | Status: DC

## 2022-12-28 MED ORDER — PRAVASTATIN SODIUM 40 MG PO TABS: 40.0000 mg | ORAL_TABLET | Freq: Every evening | ORAL | 3 refills | Status: DC

## 2022-12-28 MED ORDER — PRAVASTATIN SODIUM 80 MG PO TABS: 80.0000 mg | ORAL_TABLET | Freq: Every evening | ORAL | 11 refills | Status: DC

## 2022-12-28 MED ORDER — CHLORTHALIDONE 15 MG PO TABS: 15.0000 mg | ORAL_TABLET | Freq: Every day | ORAL | 3 refills | Status: DC

## 2022-12-28 NOTE — Assessment & Plan Note (Signed)
Last ASCVD risk is 16.3% on 8/11 wth LDL 174 amd TCHOL 255. Patient is on Zetia 10 mg. Previously on Crestor, discontinued for myalgias.   Provided education about lipid lowering medications and role in MACE risk reduction. Patient has been historically hesitant to adding medications. Proposed trial of Pravastatin as has lower risk of myalgias as side effect with some risk reduction. Patient agreed; will start at low dose and will plan to increase to max dose at follow up if tolerating. Consider adding Bempenoic acid to regimen at follow up -Lipid panel today

## 2022-12-28 NOTE — Assessment & Plan Note (Signed)
Discussed dietary habits today. -follow up A1c

## 2022-12-28 NOTE — Patient Instructions (Addendum)
Thank you, Ms.Marylene Buerger for allowing [name] to provide your care today. Today we discussed   Blood pressure: Change how you take Chlorthalidone - break the pill in half and take 12.5 mg daily - this should help with the constant need to urinate Take the other medications as needed  Cholesterol Please pick up a medication called pravastatin Take one 40 mg pill every day We will continue monitoring for side effects but this should help being your cholesterol down  Continue working on your diet and exercise!  I sent the referral for colonoscopy. You are also receiving your flu shot today. .    I have ordered the following labs for you:   Lab Orders         BMP8+Anion Gap         Lipid Profile         POC Hbg A1C      I will call if any are abnormal. All of your labs can be accessed through "My Chart".  My Chart Access: [email or web address]  Please follow-up in 4 weeks  We look forward to seeing you next time. Please call our clinic at [phone] if you have any questions or concerns. The best time to call is Monday-Friday from 9am-4pm, but there is someone available 24/7. If after hours or the weekend, call the main hospital number and ask for the Internal Medicine Resident On-Call. If you need medication refills, please notify your pharmacy one week in advance and they will send [name] a request.   Thank you for letting us take part in your care. Wishing you the best!  Romana Juniper, MD 12/28/2022, 3:34 PM [facility] Resident, PGY-1

## 2022-12-28 NOTE — Progress Notes (Signed)
Subjective:  CC: follow up  HPI:  [name] is a 62 y.o. female with a past medical history stated below and presents today for HTN, HLD, and healthcare maintenance. Please see problem based assessment and plan for additional details.  Past Medical History:  Diagnosis Date   Acute sinusitis 01/20/2011   Qualifier: Diagnosis of  By: Marius Ditch RN, Theresa     Allergic rhinitis 09/23/2015   Carpal tunnel syndrome, bilateral    Cervical polyp    possible history of cervical polyp   Complication of anesthesia    low bp after hysterctomy   Cystocele 01/13/2010   DDD (degenerative disc disease), lumbar    Degenerative disk disease 10/07/2012   Degenerative lumbar spinal stenosis    Diverticulosis 03/26/2010   GERD 09/05/2009   Hyperlipidemia LDL goal < 130 09/25/2008   Hypertension 09/25/2008   Microscopic hematuria 01/13/2010   Morbid obesity 08/28/2010   Obstructive sleep apnea on CPAP 06/20/2010   cpap    Plantar fasciitis, bilateral 09/23/2016   Pneumonia    6/16   Uterine prolapse 11/19/2008   Vertigo 10/07/2012    Current Outpatient Medications on File Prior to Visit  Medication Sig Dispense Refill   Accu-Chek Softclix Lancets lancets Use as instructed 100 each 12   acetaminophen (TYLENOL) 500 MG tablet Take 2 tablets (1,000 mg total) by mouth every 8 (eight) hours as needed for mild pain or moderate pain. 90 tablet 0   amLODipine-olmesartan (AZOR) 10-40 MG tablet Take 1 tablet by mouth daily. 90 tablet 3   azelastine (ASTELIN) 0.1 % nasal spray USE 2 SPRAYS IN EACH NOSTRIL TWICE DAILY AS DIRECTED 30 mL 1   diclofenac Sodium (VOLTAREN) 1 % GEL Apply 2 g topically 4 (four) times daily. 100 g 3   ezetimibe (ZETIA) 10 MG tablet Take 1 tablet (10 mg total) by mouth daily. 90 tablet 3   gabapentin (NEURONTIN) 300 MG capsule Take '300mg'$  in am and '600mg'$  in pm prior to bedtime 90 capsule 0   glucose blood (ACCU-CHEK GUIDE) test strip Use as instructed 100 each 12    Loratadine 10 MG CAPS Take 1 capsule (10 mg total) by mouth daily. 90 capsule 3   meclizine (ANTIVERT) 25 MG tablet Take 1 tablet (25 mg total) by mouth 3 (three) times daily as needed. 30 tablet 0   Meloxicam 15 MG TBDP Take 15 mg by mouth daily as needed. 7 tablet 0   omega-3 acid ethyl esters (LOVAZA) 1 g capsule Take 1 g by mouth.     omeprazole (PRILOSEC) 40 MG capsule TAKE 1 CAPSULE(40 MG) BY MOUTH TWICE DAILY 180 capsule 1   spironolactone (ALDACTONE) 25 MG tablet Take 1 tablet (25 mg total) by mouth daily. 90 tablet 3   venlafaxine XR (EFFEXOR-XR) 75 MG 24 hr capsule Take 75 mg by mouth at bedtime.   0   vitamin C (ASCORBIC ACID) 500 MG tablet Take 500 mg by mouth daily.     No current facility-administered medications on file prior to visit.    Family History  Problem Relation Age of Onset   Colon cancer Mother 26   Hypertension Mother    Stroke Mother 83   Hypertension Sister    Cancer Brother        kidney   Kidney disease Brother    Prostate cancer Brother    Glaucoma Brother    Hypertension Brother    Hypertension Daughter    Hypertension Son  Breast cancer Other        maternal great-aunt   Cancer Other        maternal great aunt with breast cancer    Social History   Socioeconomic History   Marital status: Divorced    Spouse name: Not on file   Number of children: 3   Years of education: Not on file   Highest education level: Some college, no degree  Occupational History    Employer: UNEMPLOYED  Tobacco Use   Smoking status: Never   Smokeless tobacco: Never  Vaping Use   Vaping Use: Never used  Substance and Sexual Activity   Alcohol use: No    Alcohol/week: 0.0 standard drinks of alcohol   Drug use: No   Sexual activity: Not Currently  Other Topics Concern   Not on file  Social History Narrative   Current Social History 09/18/2021        Patient lives alone in an home which is 1 story. There are not steps up to the entrance the patient uses.        Patient's method of transportation is personal car.      The highest level of education was some college.      The patient currently disabled.      Identified important Relationships are "my lord and savior Jesus christ and my children"       Pets : 0       Interests / Fun: "Spend time with my grandchildren, reading the bible, bible study, church"       Current Stressors: "my general health"      Religious / Personal Beliefs: "Holiness"    Social Determinants of Health   Financial Resource Strain: Not on file  Food Insecurity: No Food Insecurity (12/28/2022)   Hunger Vital Sign    Worried About Running Out of Food in the Last Year: Never true    Ran Out of Food in the Last Year: Never true  Transportation Needs: No Transportation Needs (12/28/2022)   PRAPARE - Hydrologist (Medical): No    Lack of Transportation (Non-Medical): No  Physical Activity: Not on file  Stress: Not on file  Social Connections: Moderately Integrated (12/28/2022)   Social Connection and Isolation Panel [NHANES]    Frequency of Communication with Friends and Family: More than three times a week    Frequency of Social Gatherings with Friends and Family: More than three times a week    Attends Religious Services: More than 4 times per year    Active Member of [employer] or Organizations: Yes    Attends Music therapist: More than 4 times per year    Marital Status: Divorced  Intimate Partner Violence: Not At Risk (12/28/2022)   Humiliation, Afraid, Rape, and Kick questionnaire    Fear of Current or Ex-Partner: No    Emotionally Abused: No    Physically Abused: No    Sexually Abused: No    Review of Systems: ROS negative except for what is noted on the assessment and plan.  Objective:   Vitals:   12/28/22 1436 12/28/22 1540  BP: (!) 157/77 (!) 154/79  Pulse: 90 85  Temp: 97.8 F (36.6 C)   TempSrc: Oral   SpO2: 90%   Weight: (!) 326 lb 9.6 oz (148.1  kg)   Height: '5\' 7"'$  (1.702 m)     Physical Exam: Constitutional: well-appearing woman sitting in chair, in no acute distress HENT: normocephalic  atraumatic, mucous membranes moist Eyes: conjunctiva non-erythematous Neck: supple Cardiovascular: regular rate and rhythm, no m/r/g Pulmonary/Chest: normal work of breathing on room air, lungs clear to auscultation bilaterally Abdominal: soft, non-tender, non-distended MSK: normal bulk and tone Neurological: alert & oriented x 3, moving all extremities, ambulating with cane Skin: warm and dry Psych: Pleasant mood and affect     Assessment & Plan:   Essential hypertension During last OV added Clorthalidone to olmesartan-amlodipine and spironolactone. She is now returning for follow up. No HTN symptoms. She has been compliant with olmesartan-amlodipine as prescribed, but reports partial compliance to chlorthalidone. She only takes it 1-2 per week as she neds to go to bathroom more often when she takes it. She did not take it today.  Vitals:   12/28/22 1436 12/28/22 1540  BP: (!) 157/77 (!) 154/79   Will decrease Chlorthalidone dose to 15 mg daily to attempt better BP control and compliance - follow up in 4 weeks for BP management   Obstructive sleep apnea on CPAP Received autoPAP machine and is using nightly  Hyperlipidemia LDL goal <100 Last ASCVD risk is 16.3% on 8/11 wth LDL 174 amd TCHOL 255. Patient is on Zetia 10 mg. Previously on Crestor, discontinued for myalgias.   Provided education about lipid lowering medications and role in MACE risk reduction. Patient has been historically hesitant to adding medications. Proposed trial of Pravastatin as has lower risk of myalgias as side effect with some risk reduction. Patient agreed; will start at low dose and will plan to increase to max dose at follow up if tolerating. Consider adding Bempenoic acid to regimen at follow up -Lipid panel today   Prediabetes Discussed dietary habits  today. -follow up A1c  Routine health maintenance Referral for colonoscopy today Influenza vaccine today Refilled medications   Patient discussed with Dr. Jimmye Norman

## 2022-12-28 NOTE — Assessment & Plan Note (Signed)
Received autoPAP machine and is using nightly

## 2022-12-28 NOTE — Assessment & Plan Note (Signed)
During last OV added Clorthalidone to olmesartan-amlodipine and spironolactone. She is now returning for follow up. No HTN symptoms. She has been compliant with olmesartan-amlodipine as prescribed, but reports partial compliance to chlorthalidone. She only takes it 1-2 per week as she neds to go to bathroom more often when she takes it. She did not take it today.  Vitals:   12/28/22 1436 12/28/22 1540  BP: (!) 157/77 (!) 154/79   Will decrease Chlorthalidone dose to 15 mg daily to attempt better BP control and compliance - follow up in 4 weeks for BP management   Addendum Chlorthalidone 15 mg is not covered by insurance. Presented patient with option to prescribe equivalent dose of chlorthalidone in HCTZ, but patient did not like taking this med. She will attempt splitting 25 mg Chlorthalidone in half and take one daily. Please follow up next OV

## 2022-12-28 NOTE — Assessment & Plan Note (Signed)
Referral for colonoscopy today Influenza vaccine today Refilled medications

## 2022-12-29 ENCOUNTER — Telehealth: Payer: Self-pay

## 2022-12-29 LAB — BMP8+ANION GAP
Anion Gap: 17 mmol/L (ref 10.0–18.0)
BUN/Creatinine Ratio: 27 (ref 12–28)
BUN: 21 mg/dL (ref 8–27)
CO2: 20 mmol/L (ref 20–29)
Calcium: 9.7 mg/dL (ref 8.7–10.3)
Chloride: 101 mmol/L (ref 96–106)
Creatinine, Ser: 0.78 mg/dL (ref 0.57–1.00)
Glucose: 78 mg/dL (ref 70–99)
Potassium: 4.2 mmol/L (ref 3.5–5.2)
Sodium: 138 mmol/L (ref 134–144)
eGFR: 86 mL/min/{1.73_m2} (ref >59)

## 2022-12-29 LAB — LIPID PANEL
Chol/HDL Ratio: 5 ratio — ABNORMAL HIGH (ref 0.0–4.4)
Cholesterol, Total: 264 mg/dL — ABNORMAL HIGH (ref 100–199)
HDL: 53 mg/dL (ref >39)
LDL Chol Calc (NIH): 182 mg/dL — ABNORMAL HIGH (ref 0–99)
Triglycerides: 158 mg/dL — ABNORMAL HIGH (ref 0–149)
VLDL Cholesterol Cal: 29 mg/dL (ref 5–40)

## 2022-12-29 NOTE — Telephone Encounter (Signed)
Prior Authorization for patient (ozempic) came through on cover my meds was submitted with last office notes awaiting approval or denial 

## 2022-12-29 NOTE — Telephone Encounter (Signed)
Decision:Denied Amy Jarvis (Key: [id number]) Rx #: [id number] Ozempic (2 MG/DOSE) '8MG'$ Fayne Mediate pen-injectors Form [employer] Medicare Part D Electronic Prior Authorization Form (2017 NCPDP) Created Message from [place] Reference Number: [id number]. OZEMPIC INJ '8MG'$ /3ML is denied due to Plan Exclusion. For further questions, call [phone].

## 2022-12-30 ENCOUNTER — Other Ambulatory Visit: Payer: Self-pay | Admitting: Student

## 2022-12-30 DIAGNOSIS — I10 Essential (primary) hypertension: Secondary | ICD-10-CM

## 2022-12-30 MED ORDER — CHLORTHALIDONE 25 MG PO TABS: 12.5000 mg | ORAL_TABLET | Freq: Every day | ORAL | 0 refills | Status: DC

## 2022-12-30 NOTE — Assessment & Plan Note (Signed)
Attempted refill for Ozempic but prior auth was denied. Will email Ms. Zane Herald for assistance. If patient has not been notified, consider trial of another medication if covered by insurance

## 2022-12-30 NOTE — Progress Notes (Signed)
Patient called and aware. She has been started on Pravastatin. If patient tolerates, please continue to escalate therapy.

## 2022-12-31 ENCOUNTER — Encounter: Payer: Self-pay | Admitting: Dietician

## 2023-01-01 ENCOUNTER — Other Ambulatory Visit: Payer: Self-pay

## 2023-01-01 DIAGNOSIS — K219 Gastro-esophageal reflux disease without esophagitis: Secondary | ICD-10-CM

## 2023-01-01 MED ORDER — OMEPRAZOLE 40 MG PO CPDR: DELAYED_RELEASE_CAPSULE | ORAL | 1 refills | Status: DC

## 2023-01-01 NOTE — Telephone Encounter (Signed)
Unfortunately, patient does not have diabetes diagnosis and Medicare does not cover it. We will need to continue monitoring her A1c to assess for progression to DM. If so, we can attempt again with Semaglutide vs other GLP1 RA. Please let the patient know. Thank you!

## 2023-01-01 NOTE — Progress Notes (Signed)
[facility] Attending  Case discussed with Dr. Simeon Craft  At the time of the visit.  We reviewed the resident's history and exam and pertinent patient test results.  I agree with the assessment, diagnosis, and plan of care documented in the resident's note.  Cancer screening important - colon/breast/prostate run in her family.

## 2023-01-23 DIAGNOSIS — M5451 Vertebrogenic low back pain: Secondary | ICD-10-CM | POA: Diagnosis not present

## 2023-01-23 DIAGNOSIS — G4733 Obstructive sleep apnea (adult) (pediatric): Secondary | ICD-10-CM | POA: Diagnosis not present

## 2023-01-25 DIAGNOSIS — J40 Bronchitis, not specified as acute or chronic: Secondary | ICD-10-CM | POA: Diagnosis not present

## 2023-01-25 DIAGNOSIS — R059 Cough, unspecified: Secondary | ICD-10-CM | POA: Diagnosis not present

## 2023-01-25 DIAGNOSIS — R0981 Nasal congestion: Secondary | ICD-10-CM | POA: Diagnosis not present

## 2023-01-28 DIAGNOSIS — G4733 Obstructive sleep apnea (adult) (pediatric): Secondary | ICD-10-CM | POA: Diagnosis not present

## 2023-02-03 ENCOUNTER — Other Ambulatory Visit: Payer: Self-pay | Admitting: Student

## 2023-02-03 DIAGNOSIS — I10 Essential (primary) hypertension: Secondary | ICD-10-CM

## 2023-02-04 ENCOUNTER — Other Ambulatory Visit: Payer: Self-pay

## 2023-02-04 ENCOUNTER — Other Ambulatory Visit: Payer: Self-pay | Admitting: Student

## 2023-02-04 DIAGNOSIS — L309 Dermatitis, unspecified: Secondary | ICD-10-CM

## 2023-02-04 MED ORDER — ACCU-CHEK GUIDE VI STRP: ORAL_STRIP | 12 refills | Status: DC

## 2023-02-04 MED ORDER — HYDROCORTISONE 2.5 % EX OINT: TOPICAL_OINTMENT | CUTANEOUS | 0 refills | Status: DC

## 2023-02-04 NOTE — Telephone Encounter (Signed)
Incoming fax from pharmacy: Accu Chek test strips  Message to prescriber: plan requires specific directions to process the prescription. Please fax back with frequency of how patient will be using the medication and days supply limitations.

## 2023-02-10 ENCOUNTER — Encounter: Payer: Self-pay | Admitting: Student

## 2023-02-10 NOTE — Telephone Encounter (Signed)
Patient sent message via my chart to call the office to schedule future appointment.

## 2023-02-16 ENCOUNTER — Other Ambulatory Visit: Payer: Self-pay | Admitting: Student

## 2023-02-16 DIAGNOSIS — J019 Acute sinusitis, unspecified: Secondary | ICD-10-CM

## 2023-02-16 MED ORDER — AZELASTINE HCL 0.1 % NA SOLN: NASAL | 1 refills | Status: DC

## 2023-02-18 ENCOUNTER — Other Ambulatory Visit: Payer: Self-pay | Admitting: Student

## 2023-02-18 DIAGNOSIS — R7303 Prediabetes: Secondary | ICD-10-CM

## 2023-02-18 MED ORDER — ACCU-CHEK GUIDE VI STRP: ORAL_STRIP | 12 refills | Status: AC

## 2023-02-21 DIAGNOSIS — M5451 Vertebrogenic low back pain: Secondary | ICD-10-CM | POA: Diagnosis not present

## 2023-02-21 DIAGNOSIS — G4733 Obstructive sleep apnea (adult) (pediatric): Secondary | ICD-10-CM | POA: Diagnosis not present

## 2023-03-01 DIAGNOSIS — G4733 Obstructive sleep apnea (adult) (pediatric): Secondary | ICD-10-CM | POA: Diagnosis not present

## 2023-03-12 ENCOUNTER — Other Ambulatory Visit: Payer: Self-pay | Admitting: Student

## 2023-03-12 ENCOUNTER — Other Ambulatory Visit: Payer: Self-pay | Admitting: Internal Medicine

## 2023-03-12 ENCOUNTER — Encounter: Payer: Self-pay | Admitting: Student

## 2023-03-12 DIAGNOSIS — I10 Essential (primary) hypertension: Secondary | ICD-10-CM

## 2023-03-12 DIAGNOSIS — E785 Hyperlipidemia, unspecified: Secondary | ICD-10-CM

## 2023-03-12 MED ORDER — SPIRONOLACTONE 25 MG PO TABS: 25.0000 mg | ORAL_TABLET | Freq: Every day | ORAL | 3 refills | Status: DC

## 2023-03-12 MED ORDER — EZETIMIBE 10 MG PO TABS: 10.0000 mg | ORAL_TABLET | Freq: Every day | ORAL | 3 refills | Status: DC

## 2023-03-23 ENCOUNTER — Encounter: Payer: 59 | Admitting: Student

## 2023-03-24 DIAGNOSIS — M5451 Vertebrogenic low back pain: Secondary | ICD-10-CM | POA: Diagnosis not present

## 2023-03-24 DIAGNOSIS — G4733 Obstructive sleep apnea (adult) (pediatric): Secondary | ICD-10-CM | POA: Diagnosis not present

## 2023-03-26 ENCOUNTER — Encounter: Payer: 59 | Admitting: Student

## 2023-04-01 ENCOUNTER — Ambulatory Visit (INDEPENDENT_AMBULATORY_CARE_PROVIDER_SITE_OTHER): Payer: 59 | Admitting: *Deleted

## 2023-04-01 ENCOUNTER — Ambulatory Visit (INDEPENDENT_AMBULATORY_CARE_PROVIDER_SITE_OTHER): Payer: 59 | Admitting: Student

## 2023-04-01 ENCOUNTER — Encounter: Payer: Self-pay | Admitting: Student

## 2023-04-01 ENCOUNTER — Other Ambulatory Visit: Payer: Self-pay

## 2023-04-01 VITALS — BP 128/66 | HR 86 | Temp 98.2°F | Wt 339.1 lb

## 2023-04-01 DIAGNOSIS — Z1211 Encounter for screening for malignant neoplasm of colon: Secondary | ICD-10-CM

## 2023-04-01 DIAGNOSIS — E785 Hyperlipidemia, unspecified: Secondary | ICD-10-CM

## 2023-04-01 DIAGNOSIS — Z0000 Encounter for general adult medical examination without abnormal findings: Secondary | ICD-10-CM | POA: Diagnosis not present

## 2023-04-01 DIAGNOSIS — I10 Essential (primary) hypertension: Secondary | ICD-10-CM

## 2023-04-01 DIAGNOSIS — Z800 Family history of malignant neoplasm of digestive organs: Secondary | ICD-10-CM | POA: Diagnosis not present

## 2023-04-01 DIAGNOSIS — K219 Gastro-esophageal reflux disease without esophagitis: Secondary | ICD-10-CM

## 2023-04-01 DIAGNOSIS — Z6841 Body Mass Index (BMI) 40.0 and over, adult: Secondary | ICD-10-CM

## 2023-04-01 DIAGNOSIS — J019 Acute sinusitis, unspecified: Secondary | ICD-10-CM

## 2023-04-01 MED ORDER — LORATADINE 10 MG PO CAPS: 10.0000 mg | ORAL_CAPSULE | Freq: Every day | ORAL | 3 refills | Status: DC

## 2023-04-01 MED ORDER — PRAVASTATIN SODIUM 40 MG PO TABS: 40.0000 mg | ORAL_TABLET | Freq: Every evening | ORAL | 3 refills | Status: DC

## 2023-04-01 NOTE — Progress Notes (Signed)
Subjective:   Amy Jarvis is a 62 y.o. female who presents for Medicare Annual (Subsequent) preventive examination.  Review of Systems    Defer to pcp       Objective:    Today's Vitals   04/01/23 1419  BP: 128/66  Pulse: 86  Temp: 98.2 F (36.8 C)  TempSrc: Oral  SpO2: 99%  Weight: (!) 339 lb 1.6 oz (153.8 kg)   Body mass index is 53.11 kg/m.     04/01/2023    4:11 PM 04/01/2023    2:13 PM 12/28/2022    2:30 PM 07/08/2022    2:40 PM 01/27/2022   10:49 AM 10/27/2021    1:30 PM 09/24/2021    1:50 PM  Advanced Directives  Does Patient Have a Medical Advance Directive? No No No No No No No  Does patient want to make changes to medical advance directive?      Yes (MAU/Ambulatory/Procedural Areas - Information given)   Would patient like information on creating a medical advance directive? No - Patient declined No - Patient declined Yes (MAU/Ambulatory/Procedural Areas - Information given) Yes (ED - Information included in AVS) No - Patient declined No - Patient declined No - Patient declined    Current Medications (verified) Outpatient Encounter Medications as of 04/01/2023  Medication Sig   Accu-Chek Softclix Lancets lancets Use as instructed   acetaminophen (TYLENOL) 500 MG tablet Take 2 tablets (1,000 mg total) by mouth every 8 (eight) hours as needed for mild pain or moderate pain.   amLODipine-olmesartan (AZOR) 10-40 MG tablet Take 1 tablet by mouth daily.   azelastine (ASTELIN) 0.1 % nasal spray USE 2 SPRAYS IN EACH NOSTRIL TWICE DAILY AS DIRECTED   diclofenac Sodium (VOLTAREN) 1 % GEL Apply 2 g topically 4 (four) times daily.   ezetimibe (ZETIA) 10 MG tablet Take 1 tablet (10 mg total) by mouth daily.   gabapentin (NEURONTIN) 300 MG capsule Take 300mg  in am and 600mg  in pm prior to bedtime   glucose blood (ACCU-CHEK GUIDE) test strip Change test strip with every single use, up 4 times per day as needed.   hydrocortisone 2.5 % ointment Please apply to affected area.    Loratadine 10 MG CAPS Take 1 capsule (10 mg total) by mouth daily.   meclizine (ANTIVERT) 25 MG tablet Take 1 tablet (25 mg total) by mouth 3 (three) times daily as needed.   omega-3 acid ethyl esters (LOVAZA) 1 g capsule Take 1 g by mouth.   omeprazole (PRILOSEC) 40 MG capsule TAKE 1 CAPSULE(40 MG) BY MOUTH TWICE DAILY   pravastatin (PRAVACHOL) 40 MG tablet Take 1 tablet (40 mg total) by mouth every evening.   Semaglutide, 2 MG/DOSE, (OZEMPIC, 2 MG/DOSE,) 8 MG/3ML SOPN INJECT 2 MG UNDER THE SKIN ONCE A WEEK   spironolactone (ALDACTONE) 25 MG tablet Take 1 tablet (25 mg total) by mouth daily.   venlafaxine XR (EFFEXOR-XR) 75 MG 24 hr capsule Take 75 mg by mouth at bedtime.    vitamin C (ASCORBIC ACID) 500 MG tablet Take 500 mg by mouth daily.   No facility-administered encounter medications on file as of 04/01/2023.    Allergies (verified) Ace inhibitors and Promethazine hcl   History: Past Medical History:  Diagnosis Date   Acute sinusitis 01/20/2011   Qualifier: Diagnosis of  By: Damita Dunnings RN, Theresa     Allergic rhinitis 09/23/2015   Carpal tunnel syndrome, bilateral    Cervical polyp    possible history of cervical polyp  Complication of anesthesia    low bp after hysterctomy   Cystocele 01/13/2010   DDD (degenerative disc disease), lumbar    Degenerative disk disease 10/07/2012   Degenerative lumbar spinal stenosis    Diverticulosis 03/26/2010   GERD 09/05/2009   Hyperlipidemia LDL goal < 130 09/25/2008   Hypertension 09/25/2008   Microscopic hematuria 01/13/2010   Morbid obesity 08/28/2010   Obstructive sleep apnea on CPAP 06/20/2010   cpap    Plantar fasciitis, bilateral 09/23/2016   Pneumonia    6/16   Uterine prolapse 11/19/2008   Vertigo 10/07/2012   Past Surgical History:  Procedure Laterality Date   ABDOMINAL HYSTERECTOMY     CHOLECYSTECTOMY  2010   COLONOSCOPY     COLONOSCOPY WITH PROPOFOL N/A 03/23/2017   Procedure: COLONOSCOPY WITH PROPOFOL;  Surgeon:  Meryl Dare, MD;  Location: [facility];  Service: Endoscopy;  Laterality: N/A;   CYSTOSCOPY N/A 12/26/2015   Procedure: CYSTOSCOPY;  Surgeon: Mitchel Honour, DO;  Location: [facility];  Service: Gynecology;  Laterality: N/A;   HYSTEROSCOPY WITH D & C N/A 01/02/2014   Procedure: DILATATION AND CURETTAGE /HYSTEROSCOPY;  Surgeon: Mitchel Honour, DO;  Location: [facility];  Service: Gynecology;  Laterality: N/A;   LAPAROSCOPIC VAGINAL HYSTERECTOMY WITH SALPINGO OOPHORECTOMY Bilateral 12/26/2015   Procedure: LAPAROSCOPIC ASSISTED VAGINAL HYSTERECTOMY WITH SALPINGO OOPHORECTOMY;  Surgeon: Mitchel Honour, DO;  Location: [facility];  Service: Gynecology;  Laterality: Bilateral;   RECTOCELE REPAIR N/A 12/26/2015   Procedure: POSTERIOR REPAIR (RECTOCELE);  Surgeon: Mitchel Honour, DO;  Location: [facility];  Service: Gynecology;  Laterality: N/A;   ROTATOR CUFF REPAIR     left   TUBAL LIGATION  1992   Family History  Problem Relation Age of Onset   Colon cancer Mother 30   Hypertension Mother    Stroke Mother 22   Hypertension Sister    Cancer Brother        kidney   Kidney disease Brother    Prostate cancer Brother    Glaucoma Brother    Hypertension Brother    Hypertension Daughter    Hypertension Son    Breast cancer Other        maternal great-aunt   Cancer Other        maternal great aunt with breast cancer   Social History   Socioeconomic History   Marital status: Divorced    Spouse name: Not on file   Number of children: 3   Years of education: Not on file   Highest education level: Some college, no degree  Occupational History    Employer: UNEMPLOYED  Tobacco Use   Smoking status: Never   Smokeless tobacco: Never  Vaping Use   Vaping Use: Never used  Substance and Sexual Activity   Alcohol use: No    Alcohol/week: 0.0 standard drinks of alcohol   Drug use: No   Sexual activity: Not Currently  Other Topics Concern   Not on file  Social History Narrative   Current Social History  09/18/2021        Patient lives alone in an home which is 1 story. There are not steps up to the entrance the patient uses.       Patient's method of transportation is personal car.      The highest level of education was some college.      The patient currently disabled.      Identified important Relationships are "my lord and savior Jesus christ and my children"  Pets : 0       Interests / Fun: "Spend time with my grandchildren, reading the bible, bible study, church"       Current Stressors: "my general health"      Religious / Personal Beliefs: "Holiness"    Social Determinants of Health   Financial Resource Strain: Low Risk  (04/01/2023)   Overall Financial Resource Strain (CARDIA)    Difficulty of Paying Living Expenses: Not very hard  Food Insecurity: No Food Insecurity (04/01/2023)   Hunger Vital Sign    Worried About Running Out of Food in the Last Year: Never true    Ran Out of Food in the Last Year: Never true  Transportation Needs: No Transportation Needs (04/01/2023)   PRAPARE - Administrator, Civil Service (Medical): No    Lack of Transportation (Non-Medical): No  Physical Activity: Inactive (04/01/2023)   Exercise Vital Sign    Days of Exercise per Week: 0 days    Minutes of Exercise per Session: 10 min  Stress: Stress Concern Present (04/01/2023)   [employer] of Occupational Health - Occupational Stress Questionnaire    Feeling of Stress : To some extent  Social Connections: Moderately Integrated (04/01/2023)   Social Connection and Isolation Panel [NHANES]    Frequency of Communication with Friends and Family: More than three times a week    Frequency of Social Gatherings with Friends and Family: Three times a week    Attends Religious Services: More than 4 times per year    Active Member of Clubs or Organizations: Yes    Attends Engineer, structural: More than 4 times per year    Marital Status: Divorced    Tobacco  Counseling Counseling given: Not Answered   Clinical Intake:  Pre-visit preparation completed: Yes  Pain : Faces Faces Pain Scale: Hurts a little bit Pain Type: Chronic pain Pain Location: Shoulder Pain Orientation: Left Pain Onset: More than a month ago Pain Frequency: Intermittent Pain Relieving Factors: rest, not using it Effect of Pain on Daily Activities: hurts to move  Faces Pain Scale: Hurts a little bit Pain Relieving Factors: rest, not using it  BMI - recorded: 53.11 Nutritional Status: BMI > 30  Obese Nutritional Risks: None Diabetes: No     Diabetic?NO  Interpreter Needed?: No  Information entered by :: [email or web address]   Activities of Daily Living    04/01/2023    4:17 PM 12/28/2022    2:32 PM  In your present state of health, do you have any difficulty performing the following activities:  Hearing? 0 0  Vision? 0 0  Difficulty concentrating or making decisions? 0 0  Walking or climbing stairs? 1 1  Dressing or bathing? 0 0  Doing errands, shopping? 1 1  Comment  AT TIMES    Patient Care Team: Morene Crocker, MD as PCP - Freddi Che, DO as Consulting Physician (Obstetrics and Gynecology) Mateo Flow, MD as Consulting Physician (Ophthalmology)  Indicate any recent Medical Services you may have received from other than [facility] providers in the past year (date may be approximate).     Assessment:   This is a routine wellness examination for Amy Jarvis.  Hearing/Vision screen No results found.  Dietary issues and exercise activities discussed:     Goals Addressed               This Visit's Progress     Patient Stated (pt-stated)        "My Goals are  to live a healthy life To get my weight under control and Im sure everything else will fall into place.  In Jesus name Amen Amen"      Depression Screen    04/01/2023    4:18 PM 12/28/2022    2:31 PM 07/08/2022    2:37 PM 10/27/2021    2:52 PM 09/24/2021    4:56 PM  09/18/2021    3:59 PM 03/19/2021   10:20 AM  PHQ 2/9 Scores  PHQ - 2 Score 4 0 2 4 2  0 2  PHQ- 9 Score 11 2 15 18 12  9     Fall Risk    04/01/2023    4:17 PM 12/28/2022    2:31 PM 07/08/2022    2:37 PM 01/27/2022   10:49 AM 10/27/2021    1:30 PM  Fall Risk   Falls in the past year? 0  0 0 0  Number falls in past yr: 0  0 0 0  Injury with Fall? 0  0 0 0  Risk for fall due to : Impaired mobility;Impaired balance/gait Impaired balance/gait Impaired balance/gait No Fall Risks No Fall Risks  Follow up Falls evaluation completed Falls evaluation completed Falls evaluation completed;Falls prevention discussed Falls evaluation completed;Falls prevention discussed Falls evaluation completed    FALL RISK PREVENTION PERTAINING TO THE HOME:  Any stairs in or around the home? No  If so, are there any without handrails?  N/a Home free of loose throw rugs in walkways, pet beds, electrical cords, etc? Yes  Adequate lighting in your home to reduce risk of falls? Yes   ASSISTIVE DEVICES UTILIZED TO PREVENT FALLS:  Life alert? No  Use of a cane, walker or w/c? Yes  Grab bars in the bathroom? No  Shower chair or bench in shower? No  Elevated toilet seat or a handicapped toilet? Yes   TIMED UP AND GO:  Was the test performed? No .  Length of time to ambulate 10 feet: 0 sec.   Gait slow and steady with assistive device  Cognitive Function:        04/01/2023    4:14 PM 09/18/2021    4:00 PM  6CIT Screen  What Year? 0 points 0 points  What month? 0 points 0 points  What time? 0 points 0 points  Count back from 20 0 points 0 points  Months in reverse 0 points 0 points  Repeat phrase 0 points 0 points  Total Score 0 points 0 points    Immunizations Immunization History  Administered Date(s) Administered   Influenza Split 10/07/2012   Influenza Whole 09/05/2009, 08/28/2010   Influenza,inj,Quad PF,6+ Mos 10/25/2013, 08/23/2014, 12/27/2015, 08/20/2017, 08/12/2018, 10/31/2019, 03/19/2021,  09/24/2021, 12/28/2022   PFIZER SARS-COV-2 Pediatric Vaccination 5-52yrs 02/23/2020, 03/05/2020   Pneumococcal Polysaccharide-23 10/23/2015   Td 09/25/2008   Tdap 11/11/2018   Zoster, Live 02/05/2013    TDAP status: Up to date  Flu Vaccine status: Up to date  Pneumococcal vaccine status: Up to date  Covid-19 vaccine status: Completed vaccines  Qualifies for Shingles Vaccine? Yes   Zostavax completed Yes   Shingrix Completed?: No.    Education has been provided regarding the importance of this vaccine. Patient has been advised to call insurance company to determine out of pocket expense if they have not yet received this vaccine. Advised may also receive vaccine at local pharmacy or Health Dept. Verbalized acceptance and understanding.  Screening Tests Health Maintenance  Topic Date Due   Zoster Vaccines- Shingrix (1  of 2) Never done   COVID-19 Vaccine (1) 03/05/2020   COLONOSCOPY (Pts 45-2yrs Insurance coverage will need to be confirmed)  03/23/2022   INFLUENZA VACCINE  07/01/2023   Medicare Annual Wellness (AWV)  03/31/2024   MAMMOGRAM  12/21/2024   DTaP/Tdap/Td (3 - Td or Tdap) 11/11/2028   Hepatitis C Screening  Completed   HIV Screening  Completed   HPV VACCINES  Aged Out   PAP SMEAR-Modifier  Discontinued    Health Maintenance  Health Maintenance Due  Topic Date Due   Zoster Vaccines- Shingrix (1 of 2) Never done   COVID-19 Vaccine (1) 03/05/2020   COLONOSCOPY (Pts 45-52yrs Insurance coverage will need to be confirmed)  03/23/2022    Colorectal cancer screening: Type of screening: Colonoscopy. Completed 03/23/2017. Repeat every 5 years . Pt overdue, but wishes to hold off at this time  Mammogram status: Completed 12/21/2022. Repeat every year   Lung Cancer Screening: (Low Dose CT Chest recommended if Age 2-80 years, 30 pack-year currently smoking OR have quit w/in 15years.) does not qualify.   Lung Cancer Screening Referral: N?A  Additional  Screening:  Hepatitis C Screening: does not qualify; Completed 08/18/2018  Vision Screening: Recommended annual ophthalmology exams for early detection of glaucoma and other disorders of the eye. Is the patient up to date with their annual eye exam?  Yes  Who is the provider or what is the name of the office in which the patient attends annual eye exams? [facility] If pt is not established with a provider, would they like to be referred to a provider to establish care?  N/a .   Dental Screening: Recommended annual dental exams for proper oral hygiene  Community Resource Referral / Chronic Care Management: CRR required this visit?  No   CCM required this visit?  No      Plan:     I have personally reviewed and noted the following in the patient's chart:   Medical and social history Use of alcohol, tobacco or illicit drugs  Current medications and supplements including opioid prescriptions. Patient is not currently taking opioid prescriptions. Functional ability and status Nutritional status Physical activity Advanced directives List of other physicians Hospitalizations, surgeries, and ER visits in previous 12 months Vitals Screenings to include cognitive, depression, and falls Referrals and appointments  In addition, I have reviewed and discussed with patient certain preventive protocols, quality metrics, and best practice recommendations. A written personalized care plan for preventive services as well as general preventive health recommendations were provided to patient.     Joslyn Devon, [place]   04/01/2023   Nurse Notes: face to face   Amy Jarvis , Thank you for taking time to come for your Medicare Wellness Visit. I appreciate your ongoing commitment to your health goals. Please review the following plan we discussed and let me know if I can assist you in the future.   These are the goals we discussed:  Goals       Blood Pressure < 140/90      LDL CALC  < 130      Patient Stated      "Improve my back pain"      Patient Stated (pt-stated)      "My Goals are to live a healthy life To get my weight under control and Im sure everything else will fall into place.  In Jesus name Eino Farber Amen"        This is a list of the screening  recommended for you and due dates:  Health Maintenance  Topic Date Due   Zoster (Shingles) Vaccine (1 of 2) Never done   COVID-19 Vaccine (1) 03/05/2020   Colon Cancer Screening  03/23/2022   Flu Shot  07/01/2023   Medicare Annual Wellness Visit  03/31/2024   Mammogram  12/21/2024   DTaP/Tdap/Td vaccine (3 - Td or Tdap) 11/11/2028   Hepatitis C Screening: USPSTF Recommendation to screen - Ages 75-79 yo.  Completed   HIV Screening  Completed   HPV Vaccine  Aged Out   Pap Smear  Discontinued

## 2023-04-01 NOTE — Assessment & Plan Note (Signed)
Normal blood pressure today.  She discontinued chlorthalidone because of how frequently it made her pee.  Still taking amlodipine-olmesartan and spironolactone.  No changes to these medicines today.

## 2023-04-01 NOTE — Progress Notes (Signed)
    Subjective:  Amy Jarvis is a 62 y.o. who presents to clinic for the following:  Medication Refill (Nasal spray/), Shoulder Pain (Left/), and Abdominal Pain (Epigastric pain-usually about an hour or so after meals in the evening/)  Self-discontinued chlorthalidone due to increased urinary frequency.  Weight is up 9 lbs. Eats late at night. Frequent snacking.  24 hr food recall: Wakes up around noon, drinks water. Might have an Activia. [employer] for bacon double burger and large soda. Lots of carb-rich snacks.  She reports a few months of chest pain after eating for an hour or two.  It does not happen every time she eats.  Very mild. No pain with exertion. No dysphagia or food sticking in her throat.  She takes omeprazole for reflux and has for a long time.  Shoulder pain is chronic, worse when reaching behind her to wash her back or reaching over her head.  She had a rotator cuff surgery in 2001.  Objective:   Vitals:   04/01/23 1419  BP: 128/66  Pulse: 86  Temp: 98.2 F (36.8 C)  TempSrc: Oral  SpO2: 99%  Weight: (!) 339 lb 1.6 oz (153.8 kg)    Physical Exam Well-appearing Heart rate is normal, rhythm is regular Breathing is regular and unlabored on room air Skin is warm and dry Tenderness along the course of the biceps tendon, pain with external rotation of the shoulder against resistance Alert and oriented Pleasant, concordant affect  Assessment & Plan:   Essential hypertension Normal blood pressure today.  She discontinued chlorthalidone because of how frequently it made her pee.  Still taking amlodipine-olmesartan and spironolactone.  No changes to these medicines today.  Family history of colon cancer Overdue for surveillance colonoscopy.  Will refer to GI for this.  GERD Suspect her postprandial chest/epigastric pain is due to dyspepsia or GERD.  Do not suspect a cardiac etiology.  Reassuring that it is very mild.  She is not having dysphagia.  Will  continue to follow-up, consider increasing PPI to twice daily.  If symptoms remain uncontrolled after that, she may need upper endoscopy.  Hyperlipidemia LDL goal <100 Recommended she restart her pravastatin, which she discontinued because she thought it could be causing her shoulder pain.  Lipid panel at follow-up visit.  Morbid obesity with BMI of 50.0-59.9, adult (HCC) Opportunities for improvement in her diet.  Recommended that she eat more of the fresh vegetables that she likes including collard greens and green beans.  Recommend cutting soda intake by half.  These are two goals she can work on before her next clinic visit.  Will also refer to provider referral exercise program.    Return in 3 months, for for lipid check, blood pressure, weight, heartburn.  Patient discussed with Dr. Michel Bickers MD 04/01/2023, 4:38 PM  [phone]

## 2023-04-01 NOTE — Patient Instructions (Signed)

## 2023-04-01 NOTE — Assessment & Plan Note (Signed)
Recommended she restart her pravastatin, which she discontinued because she thought it could be causing her shoulder pain.  Lipid panel at follow-up visit.

## 2023-04-01 NOTE — Assessment & Plan Note (Signed)
Overdue for surveillance colonoscopy.  Will refer to GI for this.

## 2023-04-01 NOTE — Assessment & Plan Note (Signed)
Opportunities for improvement in her diet.  Recommended that she eat more of the fresh vegetables that she likes including collard greens and green beans.  Recommend cutting soda intake by half.  These are two goals she can work on before her next clinic visit.  Will also refer to provider referral exercise program.

## 2023-04-01 NOTE — Patient Instructions (Signed)
This after visit summary is an important review of tests, referrals, and medication changes that were discussed during your visit. If you have questions or concerns, call [phone]. Outside of clinic business hours, call the main hospital at [phone] and ask the operator for the on-call internal medicine resident.   Ernesta Amble MD 04/01/2023, 3:15 PM

## 2023-04-01 NOTE — Assessment & Plan Note (Addendum)
Suspect her postprandial chest/epigastric pain is due to dyspepsia or GERD.  Do not suspect a cardiac etiology.  Reassuring that it is very mild.  She is not having dysphagia.  Will continue to follow-up, consider increasing PPI to twice daily.  If symptoms remain uncontrolled after that, she may need upper endoscopy.

## 2023-04-06 NOTE — Progress Notes (Signed)
I reviewed the AWV findings with the provider who conducted the visit. I was present in the office suite and immediately available to provide assistance and direction throughout the time the service was provided.  

## 2023-04-06 NOTE — Progress Notes (Signed)
Internal Medicine Clinic Attending  Case discussed with Dr. McLendon  At the time of the visit.  We reviewed the resident's history and exam and pertinent patient test results.  I agree with the assessment, diagnosis, and plan of care documented in the resident's note.  

## 2023-04-07 ENCOUNTER — Telehealth: Payer: Self-pay | Admitting: *Deleted

## 2023-04-07 NOTE — Telephone Encounter (Signed)
Contacted regarding PREP Class referral. She was unable to discuss at this time and stated she would return my call.

## 2023-04-27 ENCOUNTER — Other Ambulatory Visit: Payer: Self-pay

## 2023-04-27 DIAGNOSIS — I10 Essential (primary) hypertension: Secondary | ICD-10-CM

## 2023-04-27 MED ORDER — AMLODIPINE-OLMESARTAN 10-40 MG PO TABS: 1.0000 | ORAL_TABLET | Freq: Every day | ORAL | 3 refills | Status: DC

## 2023-04-28 DIAGNOSIS — G4733 Obstructive sleep apnea (adult) (pediatric): Secondary | ICD-10-CM | POA: Diagnosis not present

## 2023-05-01 DIAGNOSIS — G4733 Obstructive sleep apnea (adult) (pediatric): Secondary | ICD-10-CM | POA: Diagnosis not present

## 2023-05-01 DIAGNOSIS — M5451 Vertebrogenic low back pain: Secondary | ICD-10-CM | POA: Diagnosis not present

## 2023-05-04 DIAGNOSIS — G4733 Obstructive sleep apnea (adult) (pediatric): Secondary | ICD-10-CM | POA: Diagnosis not present

## 2023-05-27 ENCOUNTER — Telehealth: Payer: Self-pay | Admitting: Gastroenterology

## 2023-05-27 NOTE — Telephone Encounter (Signed)
OK with me per patient request.  °

## 2023-05-27 NOTE — Telephone Encounter (Signed)
Good Morning Dr Russella Dar   Patient requesting transfer of care to Dr Lavon Paganini due to preferring female provider.   Please advise.

## 2023-06-07 NOTE — Telephone Encounter (Signed)
Ok, please schedule next available with me or APP. Thanks

## 2023-06-09 NOTE — Telephone Encounter (Signed)
Called and left detailed message to call back and schedule

## 2023-06-14 ENCOUNTER — Other Ambulatory Visit: Payer: Self-pay

## 2023-06-14 DIAGNOSIS — R42 Dizziness and giddiness: Secondary | ICD-10-CM

## 2023-06-14 MED ORDER — MECLIZINE HCL 25 MG PO TABS: 25.0000 mg | ORAL_TABLET | Freq: Three times a day (TID) | ORAL | 0 refills | Status: DC | PRN

## 2023-06-18 NOTE — Telephone Encounter (Signed)
Patient called back and stated she will wait till Dr. Elana Jarvis schedule is available.

## 2023-07-01 ENCOUNTER — Other Ambulatory Visit: Payer: Self-pay | Admitting: Student

## 2023-07-01 DIAGNOSIS — K219 Gastro-esophageal reflux disease without esophagitis: Secondary | ICD-10-CM

## 2023-07-01 DIAGNOSIS — G4733 Obstructive sleep apnea (adult) (pediatric): Secondary | ICD-10-CM | POA: Diagnosis not present

## 2023-07-11 ENCOUNTER — Other Ambulatory Visit: Payer: Self-pay | Admitting: Student

## 2023-07-11 DIAGNOSIS — L309 Dermatitis, unspecified: Secondary | ICD-10-CM

## 2023-07-12 ENCOUNTER — Other Ambulatory Visit: Payer: Self-pay | Admitting: Student

## 2023-07-12 DIAGNOSIS — J019 Acute sinusitis, unspecified: Secondary | ICD-10-CM

## 2023-08-04 DIAGNOSIS — G4733 Obstructive sleep apnea (adult) (pediatric): Secondary | ICD-10-CM | POA: Diagnosis not present

## 2023-08-18 ENCOUNTER — Encounter: Payer: Self-pay | Admitting: Gastroenterology

## 2023-08-20 DIAGNOSIS — G4733 Obstructive sleep apnea (adult) (pediatric): Secondary | ICD-10-CM | POA: Diagnosis not present

## 2023-08-31 ENCOUNTER — Telehealth: Payer: Self-pay

## 2023-08-31 NOTE — Telephone Encounter (Signed)
Dr. Lavon Paganini,   Please advise OV or direct hospital. BMI> 50.

## 2023-09-01 NOTE — Telephone Encounter (Signed)
Please schedule next available direct colonoscopy at hospital endoscopy unit for BMI greater than 50, indication is family history of colon cancer.  Thank you

## 2023-09-01 NOTE — Telephone Encounter (Signed)
Amy Jarvis,   Please see attached messages. I am going to cancel her PV and colon until we have an apt date. She is aware the office will contact her once a slot is available.   Thank you, PV

## 2023-09-02 NOTE — Telephone Encounter (Signed)
Yes I called her yesterday she is aware shell get a call with an apt date when it becomes available

## 2023-09-03 DIAGNOSIS — M25512 Pain in left shoulder: Secondary | ICD-10-CM | POA: Diagnosis not present

## 2023-09-03 DIAGNOSIS — M545 Low back pain, unspecified: Secondary | ICD-10-CM | POA: Diagnosis not present

## 2023-09-03 DIAGNOSIS — M542 Cervicalgia: Secondary | ICD-10-CM | POA: Diagnosis not present

## 2023-09-14 NOTE — Telephone Encounter (Signed)
Next appt scheduled 11/19 with PCP.

## 2023-09-21 DIAGNOSIS — M25512 Pain in left shoulder: Secondary | ICD-10-CM | POA: Diagnosis not present

## 2023-10-01 DIAGNOSIS — G4733 Obstructive sleep apnea (adult) (pediatric): Secondary | ICD-10-CM | POA: Diagnosis not present

## 2023-10-14 ENCOUNTER — Encounter: Payer: 59 | Admitting: Gastroenterology

## 2023-10-19 ENCOUNTER — Ambulatory Visit: Payer: 59 | Admitting: Student

## 2023-10-19 VITALS — BP 159/80 | HR 81 | Temp 98.0°F | Ht 67.0 in | Wt 341.4 lb

## 2023-10-19 DIAGNOSIS — E785 Hyperlipidemia, unspecified: Secondary | ICD-10-CM | POA: Diagnosis not present

## 2023-10-19 DIAGNOSIS — F331 Major depressive disorder, recurrent, moderate: Secondary | ICD-10-CM

## 2023-10-19 DIAGNOSIS — H9201 Otalgia, right ear: Secondary | ICD-10-CM

## 2023-10-19 DIAGNOSIS — F32A Depression, unspecified: Secondary | ICD-10-CM

## 2023-10-19 DIAGNOSIS — Z23 Encounter for immunization: Secondary | ICD-10-CM

## 2023-10-19 DIAGNOSIS — I10 Essential (primary) hypertension: Secondary | ICD-10-CM | POA: Diagnosis not present

## 2023-10-19 DIAGNOSIS — Z6841 Body Mass Index (BMI) 40.0 and over, adult: Secondary | ICD-10-CM

## 2023-10-19 DIAGNOSIS — R7303 Prediabetes: Secondary | ICD-10-CM

## 2023-10-19 MED ORDER — EZETIMIBE 10 MG PO TABS: 10.0000 mg | ORAL_TABLET | Freq: Every day | ORAL | 3 refills | Status: DC

## 2023-10-19 NOTE — Patient Instructions (Addendum)
Thank you, Ms.Waylan Rocher for allowing [name] to provide your care today. Today we discussed  Your blood pressure - Measure your blood pressure at the same time for one month Blood pressure tips   It is best to check your BP 1-2 hours after taking your medications to see the medications effectiveness on your BP.    Here are some tips that our clinical pharmacists share for home BP monitoring:          Rest 10 minutes before taking your blood pressure.          Don't smoke or drink caffeinated beverages for at least 30 minutes before.          Take your blood pressure before (not after) you eat.          Sit comfortably with your back supported and both feet on the floor (don't cross your legs).          Elevate your arm to heart level on a table or a desk.          Use the proper sized cuff. It should fit smoothly and snugly around your bare upper arm. There should be enough room to slip a fingertip under the cuff. The bottom edge of the cuff should be 1 inch above the crease of the elbow.  Bring the measure to our meeting If still significantly elevated, we may need to start a new medication Keep working on cutting down chocolate/cookies/ portion control Keep working on movement - call the exercise referral people again Restart your Zetia + and keep taking the other cholesterol medication   Referral: to the behavioral specialist in our office. She will call you to make an appointment  Remember to schedule your colonoscopy with the gastroenterologist  I have ordered the following labs for you:   Lab Orders         Lipid Profile         BMP8+Anion Gap         Hemoglobin A1c      I will call if any are abnormal. All of your labs can be accessed through "My Chart".   My Chart Access: [email or web address]  Please follow-up in: 1 month    We look forward to seeing you next time. Please call our clinic at [phone] if you have any  questions or concerns. The best time to call is Monday-Friday from 9am-4pm, but there is someone available 24/7. If after hours or the weekend, call the main hospital number and ask for the Internal Medicine Resident On-Call. If you need medication refills, please notify your pharmacy one week in advance and they will send [name] a request.   Thank you for letting us take part in your care. Wishing you the best!  Morene Crocker, MD 10/19/2023, 4:03 PM [facility]

## 2023-10-19 NOTE — Progress Notes (Unsigned)
Subjective:  CC: chronic condition follow up and medication refill  HPI:  [name] is a 62 y.o. female with a past medical history stated below and presents today for HTN, prediabetes screening, HLD, depression, weight management, and decreased hearing in L ear. Please see problem based assessment and plan for additional details.  Past Medical History:  Diagnosis Date   Acute sinusitis 01/20/2011   Qualifier: Diagnosis of  By: Damita Dunnings RN, Theresa     Allergic rhinitis 09/23/2015   Carpal tunnel syndrome, bilateral    Cervical polyp    possible history of cervical polyp   Complication of anesthesia    low bp after hysterctomy   Cystocele 01/13/2010   DDD (degenerative disc disease), lumbar    Degenerative disk disease 10/07/2012   Degenerative lumbar spinal stenosis    Diverticulosis 03/26/2010   GERD 09/05/2009   Hyperlipidemia LDL goal < 130 09/25/2008   Hypertension 09/25/2008   Microscopic hematuria 01/13/2010   Morbid obesity 08/28/2010   Obstructive sleep apnea on CPAP 06/20/2010   cpap    Plantar fasciitis, bilateral 09/23/2016   Pneumonia    6/16   Uterine prolapse 11/19/2008   Vertigo 10/07/2012    Current Outpatient Medications on File Prior to Visit  Medication Sig Dispense Refill   Accu-Chek Softclix Lancets lancets Use as instructed 100 each 12   acetaminophen (TYLENOL) 500 MG tablet Take 2 tablets (1,000 mg total) by mouth every 8 (eight) hours as needed for mild pain or moderate pain. 90 tablet 0   amLODipine-olmesartan (AZOR) 10-40 MG tablet Take 1 tablet by mouth daily. 90 tablet 3   azelastine (ASTELIN) 0.1 % nasal spray USE 2 SPRAYS IN EACH NOSTRIL TWICE DAILY AS DIRECTED 90 mL 2   diclofenac Sodium (VOLTAREN) 1 % GEL Apply 2 g topically 4 (four) times daily. 100 g 3   glucose blood (ACCU-CHEK GUIDE) test strip Change test strip with every single use, up 4 times per day as needed. 100 each 12   hydrocortisone 2.5 % ointment APPLY EXTERNALLY  TO THE AFFECTED AREA AS NEEDED 454 g 0   Loratadine 10 MG CAPS Take 1 capsule (10 mg total) by mouth daily. 90 capsule 3   meclizine (ANTIVERT) 25 MG tablet Take 1 tablet (25 mg total) by mouth 3 (three) times daily as needed. 30 tablet 0   omega-3 acid ethyl esters (LOVAZA) 1 g capsule Take 1 g by mouth.     omeprazole (PRILOSEC) 40 MG capsule TAKE 1 CAPSULE(40 MG) BY MOUTH TWICE DAILY 180 capsule 1   pravastatin (PRAVACHOL) 40 MG tablet Take 1 tablet (40 mg total) by mouth every evening. 90 tablet 3   Semaglutide, 2 MG/DOSE, (OZEMPIC, 2 MG/DOSE,) 8 MG/3ML SOPN INJECT 2 MG UNDER THE SKIN ONCE A WEEK 3 mL 2   spironolactone (ALDACTONE) 25 MG tablet Take 1 tablet (25 mg total) by mouth daily. 90 tablet 3   venlafaxine XR (EFFEXOR-XR) 75 MG 24 hr capsule Take 75 mg by mouth at bedtime.   0   vitamin C (ASCORBIC ACID) 500 MG tablet Take 500 mg by mouth daily.     No current facility-administered medications on file prior to visit.    Family History  Problem Relation Age of Onset   Colon cancer Mother 38   Hypertension Mother    Stroke Mother 50   Hypertension Sister    Cancer Brother        kidney   Kidney disease Brother  Prostate cancer Brother    Glaucoma Brother    Hypertension Brother    Hypertension Daughter    Hypertension Son    Breast cancer Other        maternal great-aunt   Cancer Other        maternal great aunt with breast cancer    Social History   Socioeconomic History   Marital status: Divorced    Spouse name: Not on file   Number of children: 3   Years of education: Not on file   Highest education level: Some college, no degree  Occupational History    Employer: UNEMPLOYED  Tobacco Use   Smoking status: Never   Smokeless tobacco: Never  Vaping Use   Vaping status: Never Used  Substance and Sexual Activity   Alcohol use: No    Alcohol/week: 0.0 standard drinks of alcohol   Drug use: No   Sexual activity: Not Currently  Other Topics Concern   Not  on file  Social History Narrative   Current Social History 09/18/2021        Patient lives alone in an home which is 1 story. There are not steps up to the entrance the patient uses.       Patient's method of transportation is personal car.      The highest level of education was some college.      The patient currently disabled.      Identified important Relationships are "my lord and savior Jesus christ and my children"       Pets : 0       Interests / Fun: "Spend time with my grandchildren, reading the bible, bible study, church"       Current Stressors: "my general health"      Religious / Personal Beliefs: "Holiness"    Social Determinants of Health   Financial Resource Strain: Low Risk  (04/01/2023)   Overall Financial Resource Strain (CARDIA)    Difficulty of Paying Living Expenses: Not very hard  Food Insecurity: No Food Insecurity (04/01/2023)   Hunger Vital Sign    Worried About Running Out of Food in the Last Year: Never true    Ran Out of Food in the Last Year: Never true  Transportation Needs: No Transportation Needs (04/01/2023)   PRAPARE - Administrator, Civil Service (Medical): No    Lack of Transportation (Non-Medical): No  Physical Activity: Inactive (04/01/2023)   Exercise Vital Sign    Days of Exercise per Week: 0 days    Minutes of Exercise per Session: 10 min  Stress: Stress Concern Present (04/01/2023)   [employer] of Occupational Health - Occupational Stress Questionnaire    Feeling of Stress : To some extent  Social Connections: Moderately Integrated (04/01/2023)   Social Connection and Isolation Panel [NHANES]    Frequency of Communication with Friends and Family: More than three times a week    Frequency of Social Gatherings with Friends and Family: Three times a week    Attends Religious Services: More than 4 times per year    Active Member of Clubs or Organizations: Yes    Attends Banker Meetings: More than 4 times per  year    Marital Status: Divorced  Intimate Partner Violence: Not At Risk (04/01/2023)   Humiliation, Afraid, Rape, and Kick questionnaire    Fear of Current or Ex-Partner: No    Emotionally Abused: No    Physically Abused: No    Sexually Abused:  No    Review of Systems: ROS negative except for what is noted on the assessment and plan.  Objective:   Vitals:   10/19/23 1456 10/19/23 1539  BP: (!) 156/72 (!) 159/80  Pulse: 94 81  Temp: 98 F (36.7 C)   TempSrc: Oral   SpO2: 100%   Weight: (!) 341 lb 6.4 oz (154.9 kg)   Height: 5\' 7"  (1.702 m)     Physical Exam: Constitutional: well-appearing woman sitting in chair HENT: large, burden of hardened of ear wax occluding about 80% of R ear canal. No blood and normal TM visualization.  mucous membranes moist Eyes: conjunctiva non-erythematous Neck: supple Cardiovascular: regular rate and rhythm, no m/r/g Pulmonary/Chest: normal work of breathing on room air, lungs clear to auscultation bilaterally Abdominal: soft, non-tender, non-distended MSK: normal bulk and tone. No lower extremity edema. Neurological: alert & oriented x 3, moving all extremities.antalgic gait; mobility with cane Skin: warm and dry Psych:       10/19/2023    3:31 PM  Depression screen PHQ 2/9  Decreased Interest 2  Down, Depressed, Hopeless 2  PHQ - 2 Score 4  Altered sleeping 1  Tired, decreased energy 2  Change in appetite 3  Feeling bad or failure about yourself  2  Trouble concentrating 0  Moving slowly or fidgety/restless 1  Suicidal thoughts 0  PHQ-9 Score 13  Difficult doing work/chores Somewhat difficult       10/20/2023    5:04 AM 04/01/2023    4:18 PM  GAD 7 : Generalized Anxiety Score  Nervous, Anxious, on Edge 1 1  Control/stop worrying 1 1  Worry too much - different things 0 0  Trouble relaxing 0 1  Restless 0 0  Easily annoyed or irritable 0 1  Afraid - awful might happen 0 0  Total GAD 7 Score 2 4  Anxiety Difficulty Not  difficult at all      Assessment & Plan:   Essential hypertension Above goal with asymptomatic stage 2 hypertension Patient continues to be adhered to olmesartan-amlodipine and spironolactone. She is compliant with cPAP machine. Chlorthalidone removed from med list at last OP visit but patient had discontinued it weeks prior to that encounter.  No signs of pulmonary congestion or peripheral volume overload today. Discussed needs for better control, risks associated with uncontrolled hypertension. Continue to work on insight into disease process. Patient would like to work on weight management; open to medication increase at that time but not amenable today, especially, declines further use of diuretics.  - BMP today -Folloe up in 4 weeks -If unable to restart thiazide, may need to start BB  Morbid obesity with BMI of 50.0-59.9, adult (HCC) Increase in weight since January of this year 326 -> 344 lb. Previously referred to exercise program at the [facility] but did not call back as she forgot. Honed in on life style modifications: low motivation and polyphagia associated to mood symptoms (see depression). Identified patient eats deli cookies 7+ weekly and eats most of her standard meals after 6 PM. - Encouraged to follow up with exercise program; offered aquatherapy given R knee OA but declined at this time -recheck in 3 months lifestyle modifications for adherence and monitoring. Compromised to decreasing sweet intake by half and attemtping to eat meals throughout the day -Dicussed pharmacotherapy; unable to pay for GLP1RA out of pocket; does not currently carry DM diagnosis. Reviewed other pharmacotherapy; not amenable given side effect profile  Hyperlipidemia LDL goal <100 Primary prevention.  Had stopped taking Zetia but restarted mod statin. Discussed ASCVD risk of 17.4 % and lipid lowering effects of the medications she's currently on. rior LDL of 182. Will check lipid panel today -Continue statin   -refilled Zetia -Lifestyle modifications -Consider bempedoic acid addition at next OV  Discomfort of right ear Patient presented with R ear discomfort and decreased hearing. Otoscopic examination revealed arge, burden of hardened of ear wax occluding about 80% of R ear canal. Prior history of lavage associated with vertigo, which patient asked to avoid. Dry instrumentation used to remove cerumen. No blood and normal TM visualization. - CTM -If recurrence, advised use of sodium-bicarbonate OTC drops to soften ear wax  Depression Chronic, progressing. pHQ9 today with a score of 11. GAD 7 2.Patient has been treated with venlafaxine 75 mg, stable dose for the past few months. Prior to this, on 150 mg dose without side effects. Today, patient reports she has noted increased levels of stress, insomnia, and overall worrites. Has not tried CBT in the past and would like to try prior to increase in venlafaxine dose. -Referral to integrated behavioral services    Return in about 4 weeks (around 11/16/2023) for Hypertension .  Patient discussed with Dr. Koren Bound, MD [facility]  10/20/2023, 5:29 AM

## 2023-10-20 ENCOUNTER — Encounter: Payer: Self-pay | Admitting: Student

## 2023-10-20 DIAGNOSIS — H9201 Otalgia, right ear: Secondary | ICD-10-CM | POA: Insufficient documentation

## 2023-10-20 DIAGNOSIS — F32A Depression, unspecified: Secondary | ICD-10-CM | POA: Insufficient documentation

## 2023-10-20 LAB — BMP8+ANION GAP
Anion Gap: 15 mmol/L (ref 10.0–18.0)
BUN/Creatinine Ratio: 18 (ref 12–28)
BUN: 14 mg/dL (ref 8–27)
CO2: 25 mmol/L (ref 20–29)
Calcium: 10.1 mg/dL (ref 8.7–10.3)
Chloride: 101 mmol/L (ref 96–106)
Creatinine, Ser: 0.76 mg/dL (ref 0.57–1.00)
Glucose: 82 mg/dL (ref 70–99)
Potassium: 4 mmol/L (ref 3.5–5.2)
Sodium: 141 mmol/L (ref 134–144)
eGFR: 89 mL/min/{1.73_m2} (ref >59)

## 2023-10-20 LAB — LIPID PANEL
Chol/HDL Ratio: 3.3 ratio (ref 0.0–4.4)
Cholesterol, Total: 216 mg/dL — ABNORMAL HIGH (ref 100–199)
HDL: 65 mg/dL (ref >39)
LDL Chol Calc (NIH): 135 mg/dL — ABNORMAL HIGH (ref 0–99)
Triglycerides: 93 mg/dL (ref 0–149)
VLDL Cholesterol Cal: 16 mg/dL (ref 5–40)

## 2023-10-20 LAB — HEMOGLOBIN A1C
Est. average glucose Bld gHb Est-mCnc: 137 mg/dL
Hgb A1c MFr Bld: 6.4 % — ABNORMAL HIGH (ref 4.8–5.6)

## 2023-10-20 NOTE — Assessment & Plan Note (Signed)
Patient presented with R ear discomfort and decreased hearing. Otoscopic examination revealed arge, burden of hardened of ear wax occluding about 80% of R ear canal. Prior history of lavage associated with vertigo, which patient asked to avoid. Dry instrumentation used to remove cerumen. No blood and normal TM visualization. - CTM -If recurrence, advised use of sodium-bicarbonate OTC drops to soften ear wax

## 2023-10-20 NOTE — Assessment & Plan Note (Signed)
Increase in weight since January of this year 326 -> 344 lb. Previously referred to exercise program at the [facility] but did not call back as she forgot. Honed in on life style modifications: low motivation and polyphagia associated to mood symptoms (see depression). Identified patient eats deli cookies 7+ weekly and eats most of her standard meals after 6 PM. - Encouraged to follow up with exercise program; offered aquatherapy given R knee OA but declined at this time -recheck in 3 months lifestyle modifications for adherence and monitoring. Compromised to decreasing sweet intake by half and attemtping to eat meals throughout the day -Dicussed pharmacotherapy; unable to pay for GLP1RA out of pocket; does not currently carry DM diagnosis. Reviewed other pharmacotherapy; not amenable given side effect profile

## 2023-10-20 NOTE — Assessment & Plan Note (Addendum)
Chronic, progressing. pHQ9 today with a score of 11. GAD 7 2.Patient has been treated with venlafaxine 75 mg, stable dose for the past few months. Prior to this, on 150 mg dose without side effects. Today, patient reports she has noted increased levels of stress, insomnia, and overall worrites. Has not tried CBT in the past and would like to try prior to increase in venlafaxine dose. -Referral to integrated behavioral services

## 2023-10-20 NOTE — Assessment & Plan Note (Signed)
Primary prevention. Had stopped taking Zetia but restarted mod statin. Discussed ASCVD risk of 17.4 % and lipid lowering effects of the medications she's currently on. rior LDL of 182. Will check lipid panel today -Continue statin  -refilled Zetia -Lifestyle modifications -Consider bempedoic acid addition at next OV

## 2023-10-20 NOTE — Assessment & Plan Note (Signed)
Above goal with asymptomatic stage 2 hypertension Patient continues to be adhered to olmesartan-amlodipine and spironolactone. She is compliant with cPAP machine. Chlorthalidone removed from med list at last OP visit but patient had discontinued it weeks prior to that encounter.  No signs of pulmonary congestion or peripheral volume overload today. Discussed needs for better control, risks associated with uncontrolled hypertension. Continue to work on insight into disease process. Patient would like to work on weight management; open to medication increase at that time but not amenable today, especially, declines further use of diuretics.  - BMP today -Folloe up in 4 weeks -If unable to restart thiazide, may need to start BB

## 2023-10-26 NOTE — Progress Notes (Signed)
Internal Medicine Clinic Attending  Case discussed with the resident at the time of the visit.  We reviewed the resident's history and exam and pertinent patient test results.  I agree with the assessment, diagnosis, and plan of care documented in the resident's note.  

## 2023-11-02 ENCOUNTER — Other Ambulatory Visit: Payer: Self-pay | Admitting: Student

## 2023-11-02 DIAGNOSIS — J019 Acute sinusitis, unspecified: Secondary | ICD-10-CM

## 2023-11-04 NOTE — Progress Notes (Signed)
Improvement LDL cholesterol. Increase in A1c. Discussed lifestyle modifications and behavior modification. K and BMO wnl. Not amenable to start thiazide as she is trying to avoid recurrent bathroom visits. Would like to talk about other therapies at follow up visit. Likely a beta blocker.

## 2023-11-16 ENCOUNTER — Encounter: Payer: 59 | Admitting: Student

## 2023-11-19 DIAGNOSIS — G4733 Obstructive sleep apnea (adult) (pediatric): Secondary | ICD-10-CM | POA: Diagnosis not present

## 2023-11-22 ENCOUNTER — Other Ambulatory Visit: Payer: Self-pay | Admitting: Student

## 2023-11-22 DIAGNOSIS — F339 Major depressive disorder, recurrent, unspecified: Secondary | ICD-10-CM

## 2023-11-22 NOTE — Progress Notes (Signed)
Routing social work referral to [employer]. Initially sent to [facility] in error.

## 2023-11-23 ENCOUNTER — Telehealth: Payer: Self-pay | Admitting: *Deleted

## 2023-11-23 DIAGNOSIS — G4733 Obstructive sleep apnea (adult) (pediatric): Secondary | ICD-10-CM | POA: Diagnosis not present

## 2023-11-23 NOTE — Progress Notes (Unsigned)
Complex Care Management Note Care Guide Note  11/23/2023 Name: CHARNISHA FERREYRA MRN: [id number] DOB: 13-Mar-1961   Complex Care Management Outreach Attempts: An unsuccessful telephone outreach was attempted today to offer the patient information about available complex care management services.  Follow Up Plan:  Additional outreach attempts will be made to offer the patient complex care management information and services.   Encounter Outcome:  No Answer  Burman Nieves, CCMA Care Coordination Care Guide Direct Dial: [phone]

## 2023-11-25 ENCOUNTER — Ambulatory Visit: Payer: Self-pay | Admitting: Licensed Clinical Social Worker

## 2023-11-25 NOTE — Progress Notes (Signed)
Complex Care Management Note  Care Guide Note 11/25/2023 Name: ROYA KASCHAK MRN: [id number] DOB: [birth year]  JANEI LILLA is a 62 y.o. year old female who sees Morene Crocker, MD for primary care. I reached out to Waylan Rocher by phone today to offer complex care management services.  Ms. Perry was given information about Complex Care Management services today including:   The Complex Care Management services include support from the care team which includes your Nurse Coordinator, Clinical Social Worker, or Pharmacist.  The Complex Care Management team is here to help remove barriers to the health concerns and goals most important to you. Complex Care Management services are voluntary, and the patient may decline or stop services at any time by request to their care team member.   Complex Care Management Consent Status: Patient agreed to services and verbal consent obtained.   Follow up plan:  Telephone appointment with complex care management team member scheduled for:  12/26  Encounter Outcome:  Patient Scheduled  [facility] Coordination Care Guide  Direct Dial: [phone]

## 2023-11-25 NOTE — Patient Instructions (Signed)
Visit Information  Thank you for taking time to visit with me today. Please don't hesitate to contact me if I can be of assistance to you.   Following are the goals we discussed today:   Goals Addressed             This Visit's Progress    I want to manage my depression better.       Care Coordination Interventions: Reviewed all upcoming appointments in Epic system Motivational Interviewing employed Depression screen reviewed  PHQ2/ PHQ9 completed Solution-Focused Strategies employed:  Behavioral Activation reviewed Consider senior center/silver sneakers Review depression education being mailed to home          Our next appointment is by telephone on 12/10/2023   Please call the care guide team at [phone] if you need to cancel or reschedule your appointment.   If you are experiencing a Mental Health or Behavioral Health Crisis or need someone to talk to, please call the Suicide and Crisis Lifeline: 988  Patient verbalizes understanding of instructions and care plan provided today and agrees to view in MyChart. Active MyChart status and patient understanding of how to access instructions and care plan via MyChart confirmed with patient.     Telephone follow up appointment with care management team member scheduled for: 12/10/2023

## 2023-11-25 NOTE — Patient Outreach (Signed)
  Care Coordination   Initial Visit Note   11/25/2023 Name: Amy Jarvis MRN: [id number] DOB: 1961/11/22  Amy Jarvis is a 62 y.o. year old female who sees Amy Crocker, MD for primary care. I spoke with  Amy Jarvis by phone today.  What matters to the patients health and wellness today?  Continuing to manage my depression well.    Goals Addressed             This Visit's Progress    I want to manage my depression better.       Care Coordination Interventions: Reviewed all upcoming appointments in Epic system Motivational Interviewing employed Depression screen reviewed  PHQ2/ PHQ9 completed Solution-Focused Strategies employed:  Research scientist (physical sciences) Activation reviewed Consider senior center/silver sneakers Review depression education being mailed to home          SDOH assessments and interventions completed:  Yes  SDOH Interventions Today    Flowsheet Row Most Recent Value  SDOH Interventions   Food Insecurity Interventions Intervention Not Indicated  Housing Interventions Intervention Not Indicated  Transportation Interventions Intervention Not Indicated  [Drives self unless health issues are coming up.]  Utilities Interventions Intervention Not Indicated  Alcohol Usage Interventions Intervention Not Indicated (Score <7)  Physical Activity Interventions Community Resources Provided  [employer] programs, silver sneaker, water arobics - patient declined at this time.]        Care Coordination Interventions:  Yes, provided   Interventions Today    Flowsheet Row Most Recent Value  Chronic Disease   Chronic disease during today's visit Other  [Depression]  General Interventions   General Interventions Discussed/Reviewed [employer], General Interventions Discussed  [We reviewed community senior center and silver sneakers programs as possible ways to get out of the home and have a schedule. Patient would like to work on  her health more prior to this.]  Exercise Interventions   Exercise Discussed/Reviewed Exercise Reviewed, Exercise Discussed  [Patient would like to increase her fitness prior to joining commnity fitness program. We reviewed activities she can do in her home to imrpove mobility - stretching, walking more in the home.]  Mental Health Interventions   Mental Health Discussed/Reviewed Mental Health Discussed, Mental Health Reviewed, Depression  [Patient reports her mental health has improved since returning to church in person each week. :I had to make myself do it.," We reviewed behavioral activation and patient agreed to be mailed materials on this. We reviewed her cooing skills.]        Follow up plan: Follow up call scheduled for 12/10/2023    Encounter Outcome:  Patient Visit Completed

## 2023-12-07 ENCOUNTER — Telehealth: Payer: Self-pay | Admitting: *Deleted

## 2023-12-07 NOTE — Progress Notes (Signed)
 Complex Care Management Care Guide Note  12/07/2023 Name: Amy Jarvis MRN: [id number] DOB: [birth year]  Amy Jarvis is a 63 y.o. year old female who is a primary care patient of Elnora Ip, MD and is actively engaged with the care management team. I reached out to Myrick CHRISTELLA Rummer by phone today to assist with re-scheduling  with the Licensed Clinical Child Psychotherapist.  Follow up plan: Unsuccessful telephone outreach attempt made. A HIPAA compliant phone message was left for the patient providing contact information and requesting a return call.  Thedford Franks, CCMA Care Coordination Care Guide Direct Dial: [phone]

## 2023-12-09 ENCOUNTER — Ambulatory Visit: Payer: 59 | Admitting: Student

## 2023-12-09 VITALS — BP 135/64 | HR 87 | Temp 97.5°F | Ht 67.0 in | Wt 352.2 lb

## 2023-12-09 DIAGNOSIS — Z6841 Body Mass Index (BMI) 40.0 and over, adult: Secondary | ICD-10-CM | POA: Diagnosis not present

## 2023-12-09 DIAGNOSIS — I10 Essential (primary) hypertension: Secondary | ICD-10-CM

## 2023-12-09 DIAGNOSIS — H9201 Otalgia, right ear: Secondary | ICD-10-CM

## 2023-12-09 NOTE — Progress Notes (Signed)
 CC: Follow-up  HPI:  Amy Jarvis is a 63 y.o. female living with a history stated below and presents today for follow-up. Please see problem based assessment and plan for additional details.  Past Medical History:  Diagnosis Date   Acute sinusitis 01/20/2011   Qualifier: Diagnosis of  By: Rufino RN, Theresa     Allergic rhinitis 09/23/2015   Carpal tunnel syndrome, bilateral    Cervical polyp    possible history of cervical polyp   Complication of anesthesia    low bp after hysterctomy   Cystocele 01/13/2010   DDD (degenerative disc disease), lumbar    Degenerative disk disease 10/07/2012   Degenerative lumbar spinal stenosis    Diverticulosis 03/26/2010   GERD 09/05/2009   Hyperlipidemia LDL goal < 130 09/25/2008   Hypertension 09/25/2008   Microscopic hematuria 01/13/2010   Morbid obesity 08/28/2010   Obstructive sleep apnea on CPAP 06/20/2010   cpap    Plantar fasciitis, bilateral 09/23/2016   Pneumonia    6/16   Uterine prolapse 11/19/2008   Vertigo 10/07/2012    Current Outpatient Medications on File Prior to Visit  Medication Sig Dispense Refill   Accu-Chek Softclix Lancets lancets Use as instructed 100 each 12   acetaminophen  (TYLENOL ) 500 MG tablet Take 2 tablets (1,000 mg total) by mouth every 8 (eight) hours as needed for mild pain or moderate pain. 90 tablet 0   amLODipine -olmesartan  (AZOR ) 10-40 MG tablet Take 1 tablet by mouth daily. 90 tablet 3   azelastine  (ASTELIN ) 0.1 % nasal spray USE 2 SPRAYS IN EACH NOSTRIL TWICE DAILY AS DIRECTED 90 mL 2   diclofenac  Sodium (VOLTAREN ) 1 % GEL Apply 2 g topically 4 (four) times daily. 100 g 3   ezetimibe  (ZETIA ) 10 MG tablet Take 1 tablet (10 mg total) by mouth daily. 90 tablet 3   glucose blood (ACCU-CHEK GUIDE) test strip Change test strip with every single use, up 4 times per day as needed. 100 each 12   hydrocortisone  2.5 % ointment APPLY EXTERNALLY TO THE AFFECTED AREA AS NEEDED 454 g 0   Loratadine  10  MG CAPS Take 1 capsule (10 mg total) by mouth daily. 90 capsule 3   meclizine  (ANTIVERT ) 25 MG tablet Take 1 tablet (25 mg total) by mouth 3 (three) times daily as needed. 30 tablet 0   omega-3 acid ethyl esters (LOVAZA) 1 g capsule Take 1 g by mouth.     omeprazole  (PRILOSEC) 40 MG capsule TAKE 1 CAPSULE(40 MG) BY MOUTH TWICE DAILY 180 capsule 1   pravastatin  (PRAVACHOL ) 40 MG tablet Take 1 tablet (40 mg total) by mouth every evening. 90 tablet 3   Semaglutide , 2 MG/DOSE, (OZEMPIC , 2 MG/DOSE,) 8 MG/3ML SOPN INJECT 2 MG UNDER THE SKIN ONCE A WEEK 3 mL 2   spironolactone  (ALDACTONE ) 25 MG tablet Take 1 tablet (25 mg total) by mouth daily. 90 tablet 3   venlafaxine XR (EFFEXOR-XR) 75 MG 24 hr capsule Take 75 mg by mouth at bedtime.   0   vitamin C (ASCORBIC ACID) 500 MG tablet Take 500 mg by mouth daily.     No current facility-administered medications on file prior to visit.    Family History  Problem Relation Age of Onset   Colon cancer Mother 3   Hypertension Mother    Stroke Mother 68   Hypertension Sister    Cancer Brother        kidney   Kidney disease Brother    Prostate  cancer Brother    Glaucoma Brother    Hypertension Brother    Hypertension Daughter    Hypertension Son    Breast cancer Other        maternal great-aunt   Cancer Other        maternal great aunt with breast cancer    Social History   Socioeconomic History   Marital status: Divorced    Spouse name: Not on file   Number of children: 3   Years of education: Not on file   Highest education level: Some college, no degree  Occupational History    Employer: UNEMPLOYED  Tobacco Use   Smoking status: Never   Smokeless tobacco: Never  Vaping Use   Vaping status: Never Used  Substance and Sexual Activity   Alcohol use: No    Alcohol/week: 0.0 standard drinks of alcohol   Drug use: No   Sexual activity: Not Currently  Other Topics Concern   Not on file  Social History Narrative   Current Social  History 09/18/2021        Patient lives alone in an home which is 1 story. There are not steps up to the entrance the patient uses.       Patient's method of transportation is personal car.      The highest level of education was some college.      The patient currently disabled.      Identified important Relationships are my lord and savior Jesus christ and my children       Pets : 0       Interests / Fun: Spend time with my grandchildren, reading the bible, bible study, church       Current Stressors: my general health      Religious / Personal Beliefs: Holiness    Social Drivers of Corporate Investment Banker Strain: Low Risk  (04/01/2023)   Overall Financial Resource Strain (CARDIA)    Difficulty of Paying Living Expenses: Not very hard  Food Insecurity: No Food Insecurity (11/25/2023)   Hunger Vital Sign    Worried About Running Out of Food in the Last Year: Never true    Ran Out of Food in the Last Year: Never true  Transportation Needs: No Transportation Needs (11/25/2023)   PRAPARE - Administrator, Civil Service (Medical): No    Lack of Transportation (Non-Medical): No  Physical Activity: Inactive (11/25/2023)   Exercise Vital Sign    Days of Exercise per Week: 0 days    Minutes of Exercise per Session: 0 min  Stress: Stress Concern Present (04/01/2023)   [employer] of Occupational Health - Occupational Stress Questionnaire    Feeling of Stress : To some extent  Social Connections: Moderately Integrated (04/01/2023)   Social Connection and Isolation Panel [NHANES]    Frequency of Communication with Friends and Family: More than three times a week    Frequency of Social Gatherings with Friends and Family: Three times a week    Attends Religious Services: More than 4 times per year    Active Member of Clubs or Organizations: Yes    Attends Banker Meetings: More than 4 times per year    Marital Status: Divorced  Intimate Partner  Violence: Not At Risk (11/25/2023)   Humiliation, Afraid, Rape, and Kick questionnaire    Fear of Current or Ex-Partner: No    Emotionally Abused: No    Physically Abused: No    Sexually Abused: No  Review of Systems: ROS negative except for what is noted on the assessment and plan.  Vitals:   12/09/23 1425 12/09/23 1441  BP: (!) 159/58 135/64  Pulse: 96 87  Temp: (!) 97.5 F (36.4 C)   TempSrc: Oral   SpO2: 99%   Weight: (!) 352 lb 3.2 oz (159.8 kg)   Height: 5' 7 (1.702 m)     Physical Exam: Constitutional: Obese, sitting in chair, in no acute distress Ear:  Cardiovascular: regular rate and rhythm, no m/r/g Pulmonary/Chest: normal work of breathing on room air, lungs clear to auscultation bilaterally, no LEE Abdominal: soft, non-tender, non-distended Skin: warm and dry Psych: normal mood and behavior  Assessment & Plan:     Patient discussed with Dr. Guilloud  Essential hypertension Patient here for blood pressure check after being inadequately controlled at office visit 2 months ago.  Initial blood pressure 159/58 repeat 135/64. Compliant with Azor  10-40 and spironolactone  25.  Patient taking blood pressure infrequently at home but was given log for ambulatory readings.  Patient instructed to send MyChart message with next week's readings.  Will also focus on lifestyle changes. -3 month follow-up  Discomfort of right ear Patient with continued right-sided ear discomfort since November office visit.  Physical exam from last visit unremarkable aside from large wax burden.  Patient endorses sense of fullness. No pain.  No other upper/lower respiratory symptoms.  Has not attempted treatment. Unremarkable physical exam aside wax that was initially obstructing view of tympanic membrane.  Removed in office. -Instructed patient to pick up OTC Debrox -Patient will consider ENT referral if there is no resolution -Consider eustachian tube dysfunction  Morbid obesity with  BMI of 50.0-59.9, adult (HCC) BMI is 55.  Patient with stable increase over the past few years.  Follows relatively healthy diet but does not exercise.  Lengthy discussion regarding the importance of better weight control and resources available community and at home.  Patient feels confident in her ability to make desired changes in an effort to combat other chronic conditions simultaneously.  Inquires about potential weight loss options such as Ozempic .  However, coverage unlikely given patient's A1c is in non-diabetic range. -Patient planning to join gym    Norman Lobstein, D.O. [facility] Internal Medicine, PGY-1 Phone: [phone] Date 12/10/2023 Time 6:39 PM

## 2023-12-09 NOTE — Patient Instructions (Signed)
 Please pick up Debrox which is an over-the-counter medication that should help with earwax.  Let us  know if symptoms do not improve and we can put in a referral to ENT. You have a great mindset in terms of lifestyle changes that will help further lose weight.  As we discussed, weight loss will help all of your chronic conditions.  Look into joining [employer] or another inexpensive gym to facilitate this. Follow-up in 3 months

## 2023-12-09 NOTE — Progress Notes (Signed)
 Complex Care Management Care Guide Note  12/09/2023 Name: Amy Jarvis MRN: [id number] DOB: 01-20-1961  Amy Jarvis is a 63 y.o. year old female who is a primary care patient of Elnora Ip, MD and is actively engaged with the care management team. I reached out to Myrick CHRISTELLA Rummer by phone today to assist with re-scheduling  with the Licensed Clinical Child Psychotherapist.  Follow up plan: unsuccessful telephone outreach attempt made. A HIPAA compliant phone message was left for the patient providing contact information and requesting a return call. No additional outreach attempts will be made due to inability to maintain patient contact.   Thedford Franks, CCMA Care Coordination Care Guide Direct Dial: [phone]

## 2023-12-10 ENCOUNTER — Encounter: Payer: Self-pay | Admitting: Licensed Clinical Social Worker

## 2023-12-10 NOTE — Assessment & Plan Note (Addendum)
 BMI is 55.  Patient with stable increase over the past few years.  Follows relatively healthy diet but does not exercise.  Lengthy discussion regarding the importance of better weight control and resources available community and at home.  Patient feels confident in her ability to make desired changes in an effort to combat other chronic conditions simultaneously.  Inquires about potential weight loss options such as Ozempic .  However, coverage unlikely given patient's A1c is in non-diabetic range. -Patient planning to join gym

## 2023-12-10 NOTE — Assessment & Plan Note (Addendum)
 Patient here for blood pressure check after being inadequately controlled at office visit 2 months ago.  Initial blood pressure 159/58 repeat 135/64. Compliant with Azor  10-40 and spironolactone  25.  Patient taking blood pressure infrequently at home but was given log for ambulatory readings.  Patient instructed to send MyChart message with next week's readings.  Will also focus on lifestyle changes. -3 month follow-up

## 2023-12-10 NOTE — Assessment & Plan Note (Addendum)
 Patient with continued right-sided ear discomfort since November office visit.  Physical exam from last visit unremarkable aside from large wax burden.  Patient endorses sense of fullness. No pain.  No other upper/lower respiratory symptoms.  Has not attempted treatment. Unremarkable physical exam aside wax that was initially obstructing view of tympanic membrane.  Removed in office. -Instructed patient to pick up OTC Debrox -Patient will consider ENT referral if there is no resolution -Consider eustachian tube dysfunction

## 2023-12-17 NOTE — Progress Notes (Signed)
 Internal Medicine Clinic Attending  Case discussed with the resident at the time of the visit.  We reviewed the resident's history and exam and pertinent patient test results.  I agree with the assessment, diagnosis, and plan of care documented in the resident's note.

## 2023-12-22 ENCOUNTER — Other Ambulatory Visit: Payer: Self-pay | Admitting: Student

## 2023-12-22 DIAGNOSIS — K219 Gastro-esophageal reflux disease without esophagitis: Secondary | ICD-10-CM

## 2023-12-29 ENCOUNTER — Other Ambulatory Visit: Payer: Self-pay | Admitting: Student

## 2023-12-29 ENCOUNTER — Other Ambulatory Visit: Payer: Self-pay

## 2023-12-29 DIAGNOSIS — M722 Plantar fascial fibromatosis: Secondary | ICD-10-CM

## 2023-12-29 DIAGNOSIS — L309 Dermatitis, unspecified: Secondary | ICD-10-CM

## 2023-12-29 MED ORDER — DICLOFENAC SODIUM 1 % EX GEL: 2.0000 g | Freq: Four times a day (QID) | CUTANEOUS | 3 refills | Status: AC

## 2023-12-29 NOTE — Telephone Encounter (Signed)
Medication sent to pharmacy

## 2024-01-01 DIAGNOSIS — G4733 Obstructive sleep apnea (adult) (pediatric): Secondary | ICD-10-CM | POA: Diagnosis not present

## 2024-01-04 ENCOUNTER — Telehealth: Payer: Self-pay

## 2024-01-04 ENCOUNTER — Other Ambulatory Visit: Payer: Self-pay

## 2024-01-04 DIAGNOSIS — D123 Benign neoplasm of transverse colon: Secondary | ICD-10-CM

## 2024-01-04 DIAGNOSIS — Z800 Family history of malignant neoplasm of digestive organs: Secondary | ICD-10-CM

## 2024-01-04 MED ORDER — SUFLAVE 178.7 G PO SOLR: 1.0000 | Freq: Once | ORAL | 0 refills | Status: AC

## 2024-01-04 NOTE — Telephone Encounter (Signed)
Spoke with the patient to schedule her screening colonoscopy at the [facility] Endoscopy unit on 02/10/24. Patient accepts the appointment. She declines a visit with a nurse to review her prep instructions. Asks the instructions be mailed to her.

## 2024-01-28 ENCOUNTER — Encounter: Payer: Self-pay | Admitting: Gastroenterology

## 2024-02-02 ENCOUNTER — Telehealth: Payer: Self-pay | Admitting: Gastroenterology

## 2024-02-02 MED ORDER — SUFLAVE 178.7 G PO SOLR: 1.0000 | ORAL | 0 refills | Status: AC

## 2024-02-02 NOTE — Telephone Encounter (Signed)
 Procedure:Colonoscopy Procedure date: 02/10/24 Procedure location: [facility] Arrival Time: 7:45 am Spoke with the patient Y/N: Yes Any prep concerns? No  Has the patient obtained the prep from the pharmacy ? No, prep was sent to [facility] and at stated she didn't have prep, I sent prep (Suflave) to local pharmacy Do you have a care partner and transportation: Yes Any additional concerns? No

## 2024-02-08 NOTE — Telephone Encounter (Signed)
 Inbound call from patient, states she spoke to local pharmacy and they do not have Suflave in stock, would like alternative sent in for procedure on 3/13.

## 2024-02-10 ENCOUNTER — Ambulatory Visit (HOSPITAL_COMMUNITY)
Admission: RE | Admit: 2024-02-10 | Discharge: 2024-02-10 | Disposition: A | Discharge status: Home / Self Care | Payer: 59 | Attending: Gastroenterology | Admitting: Gastroenterology

## 2024-02-10 ENCOUNTER — Encounter (HOSPITAL_COMMUNITY): Payer: Self-pay | Admitting: Gastroenterology

## 2024-02-10 ENCOUNTER — Ambulatory Visit (HOSPITAL_COMMUNITY): Admitting: Anesthesiology

## 2024-02-10 ENCOUNTER — Encounter (HOSPITAL_COMMUNITY)
Admission: RE | Disposition: A | Discharge status: Home / Self Care | Payer: Self-pay | Source: Home / Self Care | Attending: Gastroenterology

## 2024-02-10 DIAGNOSIS — K644 Residual hemorrhoidal skin tags: Secondary | ICD-10-CM | POA: Diagnosis not present

## 2024-02-10 DIAGNOSIS — D123 Benign neoplasm of transverse colon: Secondary | ICD-10-CM | POA: Diagnosis not present

## 2024-02-10 DIAGNOSIS — K635 Polyp of colon: Secondary | ICD-10-CM | POA: Diagnosis not present

## 2024-02-10 DIAGNOSIS — R7303 Prediabetes: Secondary | ICD-10-CM | POA: Insufficient documentation

## 2024-02-10 DIAGNOSIS — Z860101 Personal history of adenomatous and serrated colon polyps: Secondary | ICD-10-CM

## 2024-02-10 DIAGNOSIS — E6689 Other obesity not elsewhere classified: Secondary | ICD-10-CM | POA: Diagnosis not present

## 2024-02-10 DIAGNOSIS — Z8249 Family history of ischemic heart disease and other diseases of the circulatory system: Secondary | ICD-10-CM | POA: Diagnosis not present

## 2024-02-10 DIAGNOSIS — K648 Other hemorrhoids: Secondary | ICD-10-CM | POA: Diagnosis not present

## 2024-02-10 DIAGNOSIS — K5730 Diverticulosis of large intestine without perforation or abscess without bleeding: Secondary | ICD-10-CM

## 2024-02-10 DIAGNOSIS — Z1211 Encounter for screening for malignant neoplasm of colon: Secondary | ICD-10-CM | POA: Diagnosis not present

## 2024-02-10 DIAGNOSIS — I10 Essential (primary) hypertension: Secondary | ICD-10-CM | POA: Insufficient documentation

## 2024-02-10 DIAGNOSIS — Z800 Family history of malignant neoplasm of digestive organs: Secondary | ICD-10-CM | POA: Diagnosis not present

## 2024-02-10 DIAGNOSIS — F32A Depression, unspecified: Secondary | ICD-10-CM | POA: Diagnosis not present

## 2024-02-10 DIAGNOSIS — Z79899 Other long term (current) drug therapy: Secondary | ICD-10-CM | POA: Insufficient documentation

## 2024-02-10 DIAGNOSIS — Z6841 Body Mass Index (BMI) 40.0 and over, adult: Secondary | ICD-10-CM | POA: Diagnosis not present

## 2024-02-10 DIAGNOSIS — K219 Gastro-esophageal reflux disease without esophagitis: Secondary | ICD-10-CM | POA: Diagnosis not present

## 2024-02-10 DIAGNOSIS — G4733 Obstructive sleep apnea (adult) (pediatric): Secondary | ICD-10-CM | POA: Insufficient documentation

## 2024-02-10 HISTORY — PX: COLONOSCOPY WITH PROPOFOL: SHX5780

## 2024-02-10 HISTORY — PX: POLYPECTOMY: SHX5525

## 2024-02-10 SURGERY — COLONOSCOPY WITH PROPOFOL
Anesthesia: Monitor Anesthesia Care

## 2024-02-10 MED ORDER — PHENYLEPHRINE 80 MCG/ML (10ML) SYRINGE FOR IV PUSH (FOR BLOOD PRESSURE SUPPORT)
PREFILLED_SYRINGE | INTRAVENOUS | Status: DC | PRN
  Administered 2024-02-10 (×2): 160 ug via INTRAVENOUS
  Administered 2024-02-10: 240 ug via INTRAVENOUS
  Administered 2024-02-10: 80 ug via INTRAVENOUS

## 2024-02-10 MED ORDER — GLYCOPYRROLATE PF 0.2 MG/ML IJ SOSY
PREFILLED_SYRINGE | INTRAMUSCULAR | Status: DC | PRN
Start: 2024-02-10 — End: 2024-02-10
  Administered 2024-02-10: .2 mg via INTRAVENOUS

## 2024-02-10 MED ORDER — SODIUM CHLORIDE 0.9 % IV SOLN
INTRAVENOUS | Status: AC | PRN
  Administered 2024-02-10: 250 mL via INTRAMUSCULAR

## 2024-02-10 MED ORDER — LIDOCAINE 2% (20 MG/ML) 5 ML SYRINGE
INTRAMUSCULAR | Status: DC | PRN
  Administered 2024-02-10: 100 mg via INTRAVENOUS

## 2024-02-10 MED ORDER — DEXMEDETOMIDINE HCL IN NACL 80 MCG/20ML IV SOLN
INTRAVENOUS | Status: DC | PRN
  Administered 2024-02-10: 8 ug via INTRAVENOUS

## 2024-02-10 MED ORDER — PROPOFOL 500 MG/50ML IV EMUL
INTRAVENOUS | Status: DC | PRN
  Administered 2024-02-10: 90 ug/kg/min via INTRAVENOUS

## 2024-02-10 MED ORDER — PROPOFOL 10 MG/ML IV BOLUS
INTRAVENOUS | Status: DC | PRN
  Administered 2024-02-10: 30 mg via INTRAVENOUS

## 2024-02-10 SURGICAL SUPPLY — 20 items

## 2024-02-10 NOTE — Anesthesia Postprocedure Evaluation (Signed)
 Anesthesia Post Note  Patient: Amy Jarvis  Procedure(s) Performed: COLONOSCOPY WITH PROPOFOL POLYPECTOMY     Patient location during evaluation: PACU Anesthesia Type: MAC Level of consciousness: awake and alert Pain management: pain level controlled Vital Signs Assessment: post-procedure vital signs reviewed and stable Respiratory status: spontaneous breathing, nonlabored ventilation and respiratory function stable Cardiovascular status: stable and blood pressure returned to baseline Anesthetic complications: no   No notable events documented.  Last Vitals:  Vitals:   02/10/24 1030 02/10/24 1038  BP: (!) 140/80   Pulse: 95 94  Resp: (!) 30 (!) 32  Temp:    SpO2: 100% 98%    Last Pain:  Vitals:   02/10/24 1038  TempSrc:   PainSc: 0-No pain                 Beryle Lathe

## 2024-02-10 NOTE — Transfer of Care (Signed)
 Immediate Anesthesia Transfer of Care Note  Patient: Amy Jarvis  Procedure(s) Performed: COLONOSCOPY WITH PROPOFOL POLYPECTOMY  Patient Location: PACU  Anesthesia Type:MAC  Level of Consciousness: awake, alert , and oriented  Airway & Oxygen Therapy: Patient Spontanous Breathing and Patient connected to face mask oxygen  Post-op Assessment: Report given to RN and Post -op Vital signs reviewed and stable  Post vital signs: Reviewed and stable  Last Vitals:  Vitals Value Taken Time  BP 118/99 02/10/24 1012  Temp 35.9 C 02/10/24 1011  Pulse 99 02/10/24 1013  Resp 35 02/10/24 1013  SpO2 100 % 02/10/24 1013  Vitals shown include unfiled device data.  Last Pain:  Vitals:   02/10/24 1011  TempSrc: Temporal  PainSc: 0-No pain         Complications: No notable events documented.

## 2024-02-10 NOTE — H&P (Signed)
 [facility] History and Physical   Primary Care Physician:  Morene Crocker, MD   Reason for Procedure:  History of adenomatous colon polyps, family history of colon cancer  Plan:    Surveillance colonoscopy with possible interventions as needed     HPI: Amy Jarvis is a very pleasant 63 y.o. female here for surveillance colonoscopy.   The risks and benefits as well as alternatives of endoscopic procedure(s) have been discussed and reviewed. All questions answered. The patient agrees to proceed.    Past Medical History:  Diagnosis Date   Acute sinusitis 01/20/2011   Qualifier: Diagnosis of  By: Damita Dunnings RN, Theresa     Allergic rhinitis 09/23/2015   Carpal tunnel syndrome, bilateral    Cervical polyp    possible history of cervical polyp   Complication of anesthesia    low bp after hysterctomy   Cystocele 01/13/2010   DDD (degenerative disc disease), lumbar    Degenerative disk disease 10/07/2012   Degenerative lumbar spinal stenosis    Diverticulosis 03/26/2010   GERD 09/05/2009   Hyperlipidemia LDL goal < 130 09/25/2008   Hypertension 09/25/2008   Microscopic hematuria 01/13/2010   Morbid obesity 08/28/2010   Obstructive sleep apnea on CPAP 06/20/2010   cpap    Plantar fasciitis, bilateral 09/23/2016   Pneumonia    6/16   Uterine prolapse 11/19/2008   Vertigo 10/07/2012    Past Surgical History:  Procedure Laterality Date   ABDOMINAL HYSTERECTOMY     CHOLECYSTECTOMY  2010   COLONOSCOPY     COLONOSCOPY WITH PROPOFOL N/A 03/23/2017   Procedure: COLONOSCOPY WITH PROPOFOL;  Surgeon: Meryl Dare, MD;  Location: [facility];  Service: Endoscopy;  Laterality: N/A;   CYSTOSCOPY N/A 12/26/2015   Procedure: CYSTOSCOPY;  Surgeon: Mitchel Honour, DO;  Location: [facility];  Service: Gynecology;  Laterality: N/A;   HYSTEROSCOPY WITH D & C N/A 01/02/2014   Procedure: DILATATION AND CURETTAGE /HYSTEROSCOPY;  Surgeon: Mitchel Honour, DO;  Location: [facility];   Service: Gynecology;  Laterality: N/A;   LAPAROSCOPIC VAGINAL HYSTERECTOMY WITH SALPINGO OOPHORECTOMY Bilateral 12/26/2015   Procedure: LAPAROSCOPIC ASSISTED VAGINAL HYSTERECTOMY WITH SALPINGO OOPHORECTOMY;  Surgeon: Mitchel Honour, DO;  Location: [facility];  Service: Gynecology;  Laterality: Bilateral;   RECTOCELE REPAIR N/A 12/26/2015   Procedure: POSTERIOR REPAIR (RECTOCELE);  Surgeon: Mitchel Honour, DO;  Location: [facility];  Service: Gynecology;  Laterality: N/A;   ROTATOR CUFF REPAIR     left   TUBAL LIGATION  1992    Prior to Admission medications   Medication Sig Start Date End Date Taking? Authorizing Provider  acetaminophen (TYLENOL) 500 MG tablet Take 2 tablets (1,000 mg total) by mouth every 8 (eight) hours as needed for mild pain or moderate pain. 01/27/22  Yes Aslam, Leanna Sato, MD  amLODipine-olmesartan (AZOR) 10-40 MG tablet Take 1 tablet by mouth daily. 04/27/23 04/21/24 Yes Morene Crocker, MD  diclofenac Sodium (VOLTAREN) 1 % GEL Apply 2 g topically 4 (four) times daily. 12/29/23  Yes Morene Crocker, MD  hydrocortisone 2.5 % ointment APPLY TOPICALLY TO THE AFFECTED AREA AS NEEDED 12/29/23  Yes Morene Crocker, MD  Loratadine 10 MG CAPS Take 1 capsule (10 mg total) by mouth daily. 04/01/23  Yes Morene Crocker, MD  meclizine (ANTIVERT) 25 MG tablet Take 1 tablet (25 mg total) by mouth 3 (three) times daily as needed. 06/14/23  Yes Morene Crocker, MD  omega-3 acid ethyl esters (LOVAZA) 1 g capsule Take 1 g by mouth.   Yes [provider]  omeprazole (PRILOSEC) 40 MG capsule TAKE 1 CAPSULE(40 MG) BY MOUTH TWICE DAILY 12/22/23  Yes Morene Crocker, MD  PEG 3350-KCl-NaCl-NaSulf-MgSul (SUFLAVE) 178.7 g SOLR Take 1 kit by mouth as directed. 02/02/24  Yes Khushboo Chuck, Eleonore Chiquito, MD  pravastatin (PRAVACHOL) 40 MG tablet Take 1 tablet (40 mg total) by mouth every evening. 04/01/23 03/31/24 Yes Marrianne Mood, MD  spironolactone (ALDACTONE) 25 MG tablet Take 1  tablet (25 mg total) by mouth daily. 03/12/23 03/11/24 Yes Morene Crocker, MD  venlafaxine XR (EFFEXOR-XR) 75 MG 24 hr capsule Take 75 mg by mouth at bedtime.  03/05/16  Yes [provider]  vitamin C (ASCORBIC ACID) 500 MG tablet Take 500 mg by mouth daily.   Yes [provider]  Accu-Chek Softclix Lancets lancets Use as instructed 07/03/21   Eliezer Bottom, MD  azelastine (ASTELIN) 0.1 % nasal spray USE 2 SPRAYS IN EACH NOSTRIL TWICE DAILY AS DIRECTED 11/02/23   Morene Crocker, MD  ezetimibe (ZETIA) 10 MG tablet Take 1 tablet (10 mg total) by mouth daily. 10/19/23   Morene Crocker, MD  glucose blood (ACCU-CHEK GUIDE) test strip Change test strip with every single use, up 4 times per day as needed. 02/18/23   Morene Crocker, MD  Semaglutide, 2 MG/DOSE, (OZEMPIC, 2 MG/DOSE,) 8 MG/3ML SOPN INJECT 2 MG UNDER THE SKIN ONCE A WEEK 12/28/22   Morene Crocker, MD    Current Facility-Administered Medications  Medication Dose Route Frequency Provider Last Rate Last Admin   0.9 %  sodium chloride infusion    Continuous PRN Napoleon Form, MD 10 mL/hr at 02/10/24 0903 250 mL at 02/10/24 0903    Allergies as of 01/04/2024 - Review Complete 12/09/2023  Allergen Reaction Noted   Ace inhibitors Anaphylaxis, Itching, and Swelling    Promethazine hcl Itching, Swelling, Rash, and Other (See Comments)     Family History  Problem Relation Age of Onset   Colon cancer Mother 63   Hypertension Mother    Stroke Mother 73   Hypertension Sister    Cancer Brother        kidney   Kidney disease Brother    Prostate cancer Brother    Glaucoma Brother    Hypertension Brother    Hypertension Daughter    Hypertension Son    Breast cancer Other        maternal great-aunt   Cancer Other        maternal great aunt with breast cancer    Social History   Socioeconomic History   Marital status: Divorced    Spouse name: Not on file   Number of children: 3    Years of education: Not on file   Highest education level: Some college, no degree  Occupational History    Employer: UNEMPLOYED  Tobacco Use   Smoking status: Never   Smokeless tobacco: Never  Vaping Use   Vaping status: Never Used  Substance and Sexual Activity   Alcohol use: No    Alcohol/week: 0.0 standard drinks of alcohol   Drug use: No   Sexual activity: Not Currently  Other Topics Concern   Not on file  Social History Narrative   Current Social History 09/18/2021        Patient lives alone in an home which is 1 story. There are not steps up to the entrance the patient uses.       Patient's method of transportation is personal car.      The highest level of  education was some college.      The patient currently disabled.      Identified important Relationships are "my lord and savior Jesus christ and my children"       Pets : 0       Interests / Fun: "Spend time with my grandchildren, reading the bible, bible study, church"       Current Stressors: "my general health"      Religious / Personal Beliefs: "Holiness"    Social Drivers of Corporate investment banker Strain: Low Risk  (04/01/2023)   Overall Financial Resource Strain (CARDIA)    Difficulty of Paying Living Expenses: Not very hard  Food Insecurity: No Food Insecurity (11/25/2023)   Hunger Vital Sign    Worried About Running Out of Food in the Last Year: Never true    Ran Out of Food in the Last Year: Never true  Transportation Needs: No Transportation Needs (11/25/2023)   PRAPARE - Administrator, Civil Service (Medical): No    Lack of Transportation (Non-Medical): No  Physical Activity: Inactive (11/25/2023)   Exercise Vital Sign    Days of Exercise per Week: 0 days    Minutes of Exercise per Session: 0 min  Stress: Stress Concern Present (04/01/2023)   [employer] of Occupational Health - Occupational Stress Questionnaire    Feeling of Stress : To some extent  Social Connections:  Moderately Integrated (04/01/2023)   Social Connection and Isolation Panel [NHANES]    Frequency of Communication with Friends and Family: More than three times a week    Frequency of Social Gatherings with Friends and Family: Three times a week    Attends Religious Services: More than 4 times per year    Active Member of Clubs or Organizations: Yes    Attends Banker Meetings: More than 4 times per year    Marital Status: Divorced  Intimate Partner Violence: Not At Risk (11/25/2023)   Humiliation, Afraid, Rape, and Kick questionnaire    Fear of Current or Ex-Partner: No    Emotionally Abused: No    Physically Abused: No    Sexually Abused: No    Review of Systems:  All other review of systems negative except as mentioned in the HPI.  Physical Exam: Vital signs in last 24 hours: BP (!) 156/80   Pulse (!) 105   Temp 98 F (36.7 C) (Temporal)   Resp 18   Ht 5\' 7"  (1.702 m)   Wt (!) 154.2 kg   LMP 03/31/2013   SpO2 100%   BMI 53.25 kg/m  General:   Alert, NAD Lungs:  Clear .   Heart:  Regular rate and rhythm Abdomen:  Soft, nontender and nondistended. Neuro/Psych:  Alert and cooperative. Normal mood and affect. A and O x 3  Reviewed labs, radiology imaging, old records and pertinent past GI work up     K. Scherry Ran , MD [phone]

## 2024-02-10 NOTE — Discharge Instructions (Signed)

## 2024-02-10 NOTE — Interval H&P Note (Signed)
 History and Physical Interval Note:  02/10/2024 9:23 AM  Amy Jarvis  has presented today for surgery, with the diagnosis of screening colonoscopy.  The various methods of treatment have been discussed with the patient and family. After consideration of risks, benefits and other options for treatment, the patient has consented to  Procedure(s): COLONOSCOPY WITH PROPOFOL (N/A) as a surgical intervention.  The patient's history has been reviewed, patient examined, no change in status, stable for surgery.  I have reviewed the patient's chart and labs.  Questions were answered to the patient's satisfaction.     [name]

## 2024-02-10 NOTE — Op Note (Signed)
 [facility] Patient Name: Amy Jarvis Procedure Date : 02/10/2024 MRN: [id number] Attending MD: Napoleon Form , MD, [id number] Date of Birth: [birth year] CSN: [id number] Age: 63 Admit Type: Outpatient Procedure:                Colonoscopy Indications:              Screening in patient at increased risk: Family                            history of 1st-degree relative with colorectal                            cancer, High risk colon cancer surveillance:                            Personal history of adenoma less than 10 mm in size Providers:                Napoleon Form, MD, Doristine Mango, RN, Alan Ripper, Technician Referring MD:             Napoleon Form, MD Medicines:                Monitored Anesthesia Care Complications:            No immediate complications. Estimated Blood Loss:     Estimated blood loss was minimal. Procedure:                Pre-Anesthesia Assessment:                           - Prior to the procedure, a History and Physical                            was performed, and patient medications and                            allergies were reviewed. The patient's tolerance of                            previous anesthesia was also reviewed. The risks                            and benefits of the procedure and the sedation                            options and risks were discussed with the patient.                            All questions were answered, and informed consent                            was obtained. Prior Anticoagulants: The patient has  taken no anticoagulant or antiplatelet agents. ASA                            Grade Assessment: IV - A patient with severe                            systemic disease that is a constant threat to life.                            After reviewing the risks and benefits, the patient                            was deemed in  satisfactory condition to undergo the                            procedure.                           After obtaining informed consent, the colonoscope                            was passed under direct vision. Throughout the                            procedure, the patient's blood pressure, pulse, and                            oxygen saturations were monitored continuously. The                            PCF-190TL ([id number]) Olympus colonoscope was                            introduced through the anus and advanced to the the                            cecum, identified by appendiceal orifice and                            ileocecal valve. The colonoscopy was performed                            without difficulty. The patient tolerated the                            procedure well. The quality of the bowel                            preparation was adequate. The ileocecal valve,                            appendiceal orifice, and rectum were photographed. Scope In: 9:42:31 AM Scope Out: 10:05:10 AM Scope Withdrawal Time: 0 hours 18 minutes 39 seconds  Total Procedure Duration: 0 hours  22 minutes 39 seconds  Findings:      The perianal and digital rectal examinations were normal.      Two sessile polyps were found in the transverse colon. The polyps were 4       to 5 mm in size. These polyps were removed with a cold snare. Resection       and retrieval were complete.      Scattered small-mouthed diverticula were found in the sigmoid colon.      Non-bleeding external and internal hemorrhoids were found during       retroflexion. The hemorrhoids were medium-sized. Impression:               - Two 4 to 5 mm polyps in the transverse colon,                            removed with a cold snare. Resected and retrieved.                           - Diverticulosis in the sigmoid colon.                           - Non-bleeding external and internal hemorrhoids. Recommendation:           - Patient  has a contact number available for                            emergencies. The signs and symptoms of potential                            delayed complications were discussed with the                            patient. Return to normal activities tomorrow.                            Written discharge instructions were provided to the                            patient.                           - Resume previous diet.                           - Continue present medications.                           - Await pathology results.                           - Repeat colonoscopy in 5 years for surveillance. Procedure Code(s):        --- Professional ---                           [id number], Colonoscopy, flexible; with removal of  tumor(s), polyp(s), or other lesion(s) by snare                            technique Diagnosis Code(s):        --- Professional ---                           Z80.0, Family history of malignant neoplasm of                            digestive organs                           Z86.010, Personal history of colonic polyps                           D12.3, Benign neoplasm of transverse colon (hepatic                            flexure or splenic flexure)                           K64.8, Other hemorrhoids                           K57.30, Diverticulosis of large intestine without                            perforation or abscess without bleeding CPT copyright 2022 American [employer] Association. All rights reserved. The codes documented in this report are preliminary and upon coder review may  be revised to meet current compliance requirements. Napoleon Form, MD 02/10/2024 10:18:59 AM This report has been signed electronically. Number of Addenda: 0

## 2024-02-10 NOTE — Anesthesia Preprocedure Evaluation (Addendum)
 Anesthesia Evaluation  Patient identified by MRN, date of birth, ID band Patient awake    Reviewed: Allergy & Precautions, NPO status , Patient's Chart, lab work & pertinent test results  History of Anesthesia Complications Negative for: history of anesthetic complications  Airway Mallampati: III  TM Distance: >3 FB Neck ROM: Full    Dental  (+) Dental Advisory Given, Teeth Intact   Pulmonary sleep apnea and Continuous Positive Airway Pressure Ventilation    Pulmonary exam normal        Cardiovascular hypertension, Pt. on medications Normal cardiovascular exam     Neuro/Psych  PSYCHIATRIC DISORDERS  Depression     Vertigo     GI/Hepatic Neg liver ROS,GERD  Controlled,,  Endo/Other    Class 4 obesity Pre-DM   Renal/GU negative Renal ROS     Musculoskeletal  (+) Arthritis ,    Abdominal   Peds  Hematology negative hematology ROS (+)   Anesthesia Other Findings Chronic pain syndrome On GLP-1a   Reproductive/Obstetrics                             Anesthesia Physical Anesthesia Plan  ASA: 3  Anesthesia Plan: MAC   Post-op Pain Management: Minimal or no pain anticipated   Induction:   PONV Risk Score and Plan: 2 and Propofol infusion and Treatment may vary due to age or medical condition  Airway Management Planned: Natural Airway and Simple Face Mask  Additional Equipment: None  Intra-op Plan:   Post-operative Plan:   Informed Consent: I have reviewed the patients History and Physical, chart, labs and discussed the procedure including the risks, benefits and alternatives for the proposed anesthesia with the patient or authorized representative who has indicated his/her understanding and acceptance.       Plan Discussed with: CRNA and Anesthesiologist  Anesthesia Plan Comments:        Anesthesia Quick Evaluation

## 2024-02-11 LAB — SURGICAL PATHOLOGY

## 2024-02-14 ENCOUNTER — Encounter (HOSPITAL_COMMUNITY): Payer: Self-pay | Admitting: Gastroenterology

## 2024-02-16 ENCOUNTER — Encounter: Payer: Self-pay | Admitting: Gastroenterology

## 2024-02-21 DIAGNOSIS — G4733 Obstructive sleep apnea (adult) (pediatric): Secondary | ICD-10-CM | POA: Diagnosis not present

## 2024-02-22 ENCOUNTER — Other Ambulatory Visit: Payer: Self-pay | Admitting: Student

## 2024-02-22 ENCOUNTER — Encounter: Payer: Self-pay | Admitting: Student

## 2024-02-22 DIAGNOSIS — R7303 Prediabetes: Secondary | ICD-10-CM

## 2024-02-22 DIAGNOSIS — R42 Dizziness and giddiness: Secondary | ICD-10-CM

## 2024-02-22 MED ORDER — MECLIZINE HCL 25 MG PO TABS: 25.0000 mg | ORAL_TABLET | Freq: Three times a day (TID) | ORAL | 0 refills | Status: DC | PRN

## 2024-02-22 MED ORDER — D-CARE GLUCOMETER W/DEVICE KIT: 1.0000 [IU] | PACK | Freq: Once | 0 refills | Status: AC

## 2024-02-22 NOTE — Progress Notes (Signed)
 Patient requested glucometer for pre-diabetes monitoring. One kit sent to preferred pharmacy.

## 2024-02-29 DIAGNOSIS — G4733 Obstructive sleep apnea (adult) (pediatric): Secondary | ICD-10-CM | POA: Diagnosis not present

## 2024-03-10 ENCOUNTER — Other Ambulatory Visit: Payer: Self-pay | Admitting: Obstetrics & Gynecology

## 2024-03-10 DIAGNOSIS — Z1231 Encounter for screening mammogram for malignant neoplasm of breast: Secondary | ICD-10-CM

## 2024-03-11 ENCOUNTER — Other Ambulatory Visit: Payer: Self-pay | Admitting: Student

## 2024-03-11 DIAGNOSIS — I10 Essential (primary) hypertension: Secondary | ICD-10-CM

## 2024-03-13 NOTE — Telephone Encounter (Signed)
 Medication sent to pharmacy

## 2024-03-20 DIAGNOSIS — R7309 Other abnormal glucose: Secondary | ICD-10-CM | POA: Diagnosis not present

## 2024-03-20 DIAGNOSIS — H25813 Combined forms of age-related cataract, bilateral: Secondary | ICD-10-CM | POA: Diagnosis not present

## 2024-03-20 DIAGNOSIS — H35033 Hypertensive retinopathy, bilateral: Secondary | ICD-10-CM | POA: Diagnosis not present

## 2024-03-20 DIAGNOSIS — H40013 Open angle with borderline findings, low risk, bilateral: Secondary | ICD-10-CM | POA: Diagnosis not present

## 2024-03-20 LAB — HM DIABETES EYE EXAM

## 2024-03-21 ENCOUNTER — Other Ambulatory Visit: Payer: Self-pay | Admitting: Student

## 2024-03-21 DIAGNOSIS — E785 Hyperlipidemia, unspecified: Secondary | ICD-10-CM

## 2024-03-21 NOTE — Telephone Encounter (Signed)
 Medication sent to pharmacy

## 2024-03-27 ENCOUNTER — Other Ambulatory Visit: Payer: Self-pay | Admitting: Student

## 2024-03-27 DIAGNOSIS — J019 Acute sinusitis, unspecified: Secondary | ICD-10-CM

## 2024-03-27 NOTE — Telephone Encounter (Signed)
 Medication sent to pharmacy

## 2024-03-30 ENCOUNTER — Ambulatory Visit
Admission: RE | Admit: 2024-03-30 | Discharge: 2024-03-30 | Disposition: A | Discharge status: Home / Self Care | Source: Ambulatory Visit | Attending: Obstetrics & Gynecology | Admitting: Obstetrics & Gynecology

## 2024-03-30 ENCOUNTER — Ambulatory Visit

## 2024-03-30 DIAGNOSIS — Z1231 Encounter for screening mammogram for malignant neoplasm of breast: Secondary | ICD-10-CM | POA: Diagnosis not present

## 2024-03-30 DIAGNOSIS — G4733 Obstructive sleep apnea (adult) (pediatric): Secondary | ICD-10-CM | POA: Diagnosis not present

## 2024-04-21 ENCOUNTER — Telehealth: Payer: Self-pay

## 2024-04-21 ENCOUNTER — Ambulatory Visit: Payer: Self-pay | Admitting: Student

## 2024-04-21 ENCOUNTER — Telehealth (INDEPENDENT_AMBULATORY_CARE_PROVIDER_SITE_OTHER): Admitting: Student

## 2024-04-21 DIAGNOSIS — I159 Secondary hypertension, unspecified: Secondary | ICD-10-CM

## 2024-04-21 NOTE — Telephone Encounter (Signed)
 Unable to reach pt to sch her an appt. LVM to call the clinic back.

## 2024-04-21 NOTE — Telephone Encounter (Signed)
 Copied from CRM [id number]. Topic: Appointments - Scheduling Inquiry for Clinic >> Apr 21, 2024 10:31 AM Blair Bumpers wrote: Reason for CRM: Patient was told by Dr. Garald Jumbo to call and schedule an in-person visit with her within the next few weeks due to her having sound issues via video visit. Tried to schedule but nothing is populating for her schedule. Advised patient to give us  a call back next week to see if her schedule has populated. Please reach out to patient if a schedule populates for Dr. Gomez-Caraballo to schedule.

## 2024-04-21 NOTE — Progress Notes (Signed)
 Attempted to connect with patient throught video call visit. Has technical difficulties and was unable to address chronic medical conditions. Patient does not have acute concerns. Amy Jarvis opted to reschedule Amy Jarvis in-person appointment.  Will not charge for this visit

## 2024-04-30 DIAGNOSIS — G4733 Obstructive sleep apnea (adult) (pediatric): Secondary | ICD-10-CM | POA: Diagnosis not present

## 2024-05-09 ENCOUNTER — Encounter: Admitting: Internal Medicine

## 2024-05-18 ENCOUNTER — Encounter: Admitting: Student

## 2024-05-18 NOTE — Progress Notes (Deleted)
 CC: ***  HPI:  [name] is a 63 y.o. female with a past medical history of hypertension, obstructive sleep apnea, GERD, diverticulosis, osteoarthritis, hyperlipidemia who presents for follow-up appointment.  Please see assessment and plan for full HPI.  Medications: Tylenol  Hypertension: Amlodipine -olmesartan  10-40 mg daily Allergic rhinitis: Azelastine  nasal spray, loratadine  10 mg daily Pain: Voltaren  gel Hyperlipidemia: Zetia  10 mg daily, pravastatin  40 mg daily BPPV: Meclizine  25 mg 3 times daily. GERD: Omeprazole  40 mg daily Depression: Venlafaxine 75 mg daily  Patient was to have a appointment via virtual visit, unable to do so.  Patient had a appointment on January 2025.  At that time patient was continued on medications.  Past Medical History:  Diagnosis Date   Acute sinusitis 01/20/2011   Qualifier: Diagnosis of  By: Allen Israel RN, Theresa     Allergic rhinitis 09/23/2015   Carpal tunnel syndrome, bilateral    Cervical polyp    possible history of cervical polyp   Complication of anesthesia    low bp after hysterctomy   Cystocele 01/13/2010   DDD (degenerative disc disease), lumbar    Degenerative disk disease 10/07/2012   Degenerative lumbar spinal stenosis    Diverticulosis 03/26/2010   GERD 09/05/2009   Hyperlipidemia LDL goal < 130 09/25/2008   Hypertension 09/25/2008   Microscopic hematuria 01/13/2010   Morbid obesity 08/28/2010   Obstructive sleep apnea on CPAP 06/20/2010   cpap    Plantar fasciitis, bilateral 09/23/2016   Pneumonia    6/16   Uterine prolapse 11/19/2008   Vertigo 10/07/2012     Current Outpatient Medications:    Accu-Chek Softclix Lancets lancets, Use as instructed, Disp: 100 each, Rfl: 12   acetaminophen  (TYLENOL ) 500 MG tablet, Take 2 tablets (1,000 mg total) by mouth every 8 (eight) hours as needed for mild pain or moderate pain., Disp: 90 tablet, Rfl: 0   amLODipine -olmesartan  (AZOR ) 10-40 MG tablet, Take 1 tablet by  mouth daily., Disp: 90 tablet, Rfl: 3   azelastine  (ASTELIN ) 0.1 % nasal spray, USE 2 SPRAYS IN EACH NOSTRIL TWICE DAILY AS DIRECTED, Disp: 90 mL, Rfl: 2   diclofenac  Sodium (VOLTAREN ) 1 % GEL, Apply 2 g topically 4 (four) times daily., Disp: 100 g, Rfl: 3   ezetimibe  (ZETIA ) 10 MG tablet, Take 1 tablet (10 mg total) by mouth daily., Disp: 90 tablet, Rfl: 3   glucose blood (ACCU-CHEK GUIDE) test strip, Change test strip with every single use, up 4 times per day as needed., Disp: 100 each, Rfl: 12   hydrocortisone  2.5 % ointment, APPLY TOPICALLY TO THE AFFECTED AREA AS NEEDED, Disp: 454 g, Rfl: 3   loratadine  (CLARITIN ) 10 MG tablet, TAKE 1 TABLET BY MOUTH EVERY DAY, Disp: 90 tablet, Rfl: 3   meclizine  (ANTIVERT ) 25 MG tablet, Take 1 tablet (25 mg total) by mouth 3 (three) times daily as needed., Disp: 30 tablet, Rfl: 0   omega-3 acid ethyl esters (LOVAZA) 1 g capsule, Take 1 g by mouth., Disp: , Rfl:    omeprazole  (PRILOSEC) 40 MG capsule, TAKE 1 CAPSULE(40 MG) BY MOUTH TWICE DAILY, Disp: 180 capsule, Rfl: 1   PEG 3350 -KCl-NaCl-NaSulf-MgSul (SUFLAVE ) 178.7 g SOLR, Take 1 kit by mouth as directed., Disp: 1 each, Rfl: 0   pravastatin  (PRAVACHOL ) 40 MG tablet, TAKE 1 TABLET(40 MG) BY MOUTH EVERY EVENING, Disp: 90 tablet, Rfl: 3   Semaglutide , 2 MG/DOSE, (OZEMPIC , 2 MG/DOSE,) 8 MG/3ML SOPN, INJECT 2 MG UNDER THE SKIN ONCE A WEEK, Disp: 3 mL, Rfl:  2   spironolactone  (ALDACTONE ) 25 MG tablet, TAKE 1 TABLET(25 MG) BY MOUTH DAILY, Disp: 90 tablet, Rfl: 3   venlafaxine XR (EFFEXOR-XR) 75 MG 24 hr capsule, Take 75 mg by mouth at bedtime. , Disp: , Rfl: 0   vitamin C (ASCORBIC ACID) 500 MG tablet, Take 500 mg by mouth daily., Disp: , Rfl:   Review of Systems:  ***  Constitutional: Eye: Respiratory: Cardiovascular: GI: MSK: GU: Skin: Neuro: Endocrine:   Physical Exam:  There were no vitals filed for this visit. *** General: Patient is sitting comfortably in the room  Eyes: Pupils equal and  reactive to light, EOM intact  Head: Normocephalic, atraumatic  Neck: Supple, nontender, full range of motion, No JVD Cardio: Regular rate and rhythm, no murmurs, rubs or gallops. 2+ pulses to bilateral upper and lower extremities  Chest: No chest tenderness Pulmonary: Clear to ausculation bilaterally with no rales, rhonchi, and crackles  Abdomen: Soft, nontender with normoactive bowel sounds with no rebound or guarding  Neuro: Alert and orientated x3. CN II-XII intact. Sensation intact to upper and lower extremities. 2+ patellar reflex.  Back: No midline tenderness, no step off or deformities noted. No paraspinal muscle tenderness.  Skin: No rashes noted  MSK: 5/5 strength to upper and lower extremities.    Assessment & Plan:   No problem-specific Assessment & Plan notes found for this encounter.    Patient {GC/GE:3044014::discussed with,seen with} Dr. {WUJWJ:[id number]::NFAOZHYQ,MVHQION,GEXBMW,UXLKGMWN,UUVOZDGU,YQIHKVQ}  Jonelle Neri, DO PGY-2 Internal Medicine Resident

## 2024-05-25 ENCOUNTER — Other Ambulatory Visit: Payer: Self-pay | Admitting: Student

## 2024-05-25 DIAGNOSIS — R42 Dizziness and giddiness: Secondary | ICD-10-CM

## 2024-06-05 ENCOUNTER — Ambulatory Visit: Admitting: Student

## 2024-06-05 VITALS — BP 132/61 | HR 82 | Temp 98.0°F | Ht 67.0 in | Wt 345.0 lb

## 2024-06-05 DIAGNOSIS — E785 Hyperlipidemia, unspecified: Secondary | ICD-10-CM | POA: Diagnosis not present

## 2024-06-05 DIAGNOSIS — G4733 Obstructive sleep apnea (adult) (pediatric): Secondary | ICD-10-CM

## 2024-06-05 DIAGNOSIS — F32A Depression, unspecified: Secondary | ICD-10-CM

## 2024-06-05 DIAGNOSIS — Z23 Encounter for immunization: Secondary | ICD-10-CM

## 2024-06-05 DIAGNOSIS — Z0000 Encounter for general adult medical examination without abnormal findings: Secondary | ICD-10-CM

## 2024-06-05 DIAGNOSIS — I10 Essential (primary) hypertension: Secondary | ICD-10-CM | POA: Diagnosis not present

## 2024-06-05 NOTE — Patient Instructions (Addendum)
 Thank you, Amy Jarvis Amy Jarvis for allowing us  to provide your care today. Today we discussed:  Continue your current medications.   I have ordered the following labs for you:   Lab Orders         Lipid Profile      Tests ordered today:  As above   Referrals ordered today:   Referral Orders  No referral(s) requested today     I have ordered the following medication/changed the following medications:   Stop the following medications: There are no discontinued medications.   Start the following medications: No orders of the defined types were placed in this encounter.    Follow up: 6 months HTN     Remember:   Should you have any questions or concerns please call the internal medicine clinic at [phone].     Rayann Atway, D.O. [facility]

## 2024-06-05 NOTE — Progress Notes (Unsigned)
   Established Patient Office Visit  Subjective   Patient ID: Amy Jarvis, female    DOB: [birth year]  Age: 63 y.o. MRN: [id number]  No chief complaint on file.   HPI Amy Jarvis is a 63 y.o. female with a past medical history of hypertension, obstructive sleep apnea, GERD, diverticulosis, osteoarthritis, hyperlipidemia who presents for follow-up appointment.  Please see assessment and plan for full HPI.  Medications: Tylenol  Hypertension: Amlodipine -olmesartan  10-40 mg daily Allergic rhinitis: Azelastine  nasal spray, loratadine  10 mg daily Pain: Voltaren  gel Hyperlipidemia: Zetia  10 mg daily, pravastatin  40 mg daily BPPV: Meclizine  25 mg 3 times daily. GERD: Omeprazole  40 mg daily Depression: Venlafaxine 75 mg daily  Patient was to have a appointment via virtual visit, unable to do so.  Patient had a appointment on January 2025.  At that time patient was continued on medications.  ROS   As per assessment and plan Objective:     LMP 03/31/2013  BP Readings from Last 3 Encounters:  02/10/24 (!) 140/80  12/09/23 135/64  10/19/23 (!) 159/80   Wt Readings from Last 3 Encounters:  02/10/24 (!) 340 lb (154.2 kg)  12/09/23 (!) 352 lb 3.2 oz (159.8 kg)  10/19/23 (!) 341 lb 6.4 oz (154.9 kg)      Physical Exam   No results found for any visits on 06/05/24.  Last metabolic panel Lab Results  Component Value Date   GLUCOSE 82 10/19/2023   NA 141 10/19/2023   K 4.0 10/19/2023   CL 101 10/19/2023   CO2 25 10/19/2023   BUN 14 10/19/2023   CREATININE 0.76 10/19/2023   EGFR 89 10/19/2023   CALCIUM  10.1 10/19/2023   PROT 5.7 (L) 12/27/2015   ALBUMIN 3.2 (L) 12/27/2015   BILITOT 0.6 12/27/2015   ALKPHOS 102 12/27/2015   AST 14 (L) 12/27/2015   ALT 13 (L) 12/27/2015   ANIONGAP 8 12/27/2015   Last lipids Lab Results  Component Value Date   CHOL 216 (H) 10/19/2023   HDL 65 10/19/2023   LDLCALC 135 (H) 10/19/2023   TRIG 93 10/19/2023   CHOLHDL 3.3  10/19/2023   Last hemoglobin A1c Lab Results  Component Value Date   HGBA1C 6.4 (H) 10/19/2023   Last thyroid  functions Lab Results  Component Value Date   TSH 2.150 01/30/2020      The 10-year ASCVD risk score (Arnett DK, et al., 2019) is: 9.9%    Assessment & Plan:  Discussed with Dr Lovie    HTN BP Readings from Last 3 Encounters:  06/05/24 (!) 152/74  02/10/24 (!) 140/80  12/09/23 135/64   - amlodipine -olmesartan  10-40 daily and spirolactone 25 mg daily - 140/80  - No sxs  OSA - CPAP at night   HLD Lab Results  Component Value Date   LDLCALC 135 (H) 10/19/2023   Currently taking pravastatin  40 mg daily  - Check Lipid panel   Depression - Takes Effexor 75 mg daily, hot flashes   GERD - Prilosec 40 mg daily   - Upt odate May 2025 Pap smear     Problem List Items Addressed This Visit   None   No follow-ups on file.    Toma Edwards, DO

## 2024-06-06 LAB — LIPID PANEL
Chol/HDL Ratio: 4.1 ratio (ref 0.0–4.4)
Cholesterol, Total: 229 mg/dL — ABNORMAL HIGH (ref 100–199)
HDL: 56 mg/dL (ref >39)
LDL Chol Calc (NIH): 147 mg/dL — ABNORMAL HIGH (ref 0–99)
Triglycerides: 144 mg/dL (ref 0–149)
VLDL Cholesterol Cal: 26 mg/dL (ref 5–40)

## 2024-06-06 NOTE — Assessment & Plan Note (Signed)
 Lab Results  Component Value Date   LDLCALC 147 (H) 06/05/2024   Patient reports that she is currently taking pravastatin  40 mg daily, she is not taking Zetia  10 mg.  Reports that while she was taking the pravastatin  and Zetia  together, she was did not feel well.  Denied any myalgias. -Will check LDL today -Continue pravastatin  40 mg daily for now

## 2024-06-06 NOTE — Progress Notes (Signed)
 Internal Medicine Clinic Attending  Case discussed with the resident at the time of the visit.  We reviewed the resident's history and exam and pertinent patient test results.  I agree with the assessment, diagnosis, and plan of care documented in the resident's note.    If patient did not tolerate the pravastatin  & zetia  together, we could try a higher intensity statin (ex- rosuvastatin ) or consider referral to lipid clinic for PCSK9 inhibitor

## 2024-06-06 NOTE — Assessment & Plan Note (Signed)
 Reports that she takes Effexor 75 mg daily, primary takes it for hot flashes.  Reports that she gets it refilled by the OB/GYN.  Has no concerns. -Continue Effexor 75 mg daily

## 2024-06-06 NOTE — Assessment & Plan Note (Signed)
 BP Readings from Last 3 Encounters:  06/05/24 132/61  02/10/24 (!) 140/80  12/09/23 135/64   Patient reports that she is taking amlodipine -olmesartan  10-40 mg daily and spironolactone  25 mg daily.  Reports that she checks her blood pressures 1-2 times a week, last with pressure readings were 140/80.  Denies any symptoms of chest pain or shortness of breath.  BMP checked on 10/19/2023 with serum creatinine 0.76, EGFR 89. -Continue amlodipine -olmesartan  daily and spironolactone  daily.

## 2024-06-06 NOTE — Assessment & Plan Note (Signed)
 Reports that she uses CPAP at night.  - Continue CPAP

## 2024-06-06 NOTE — Assessment & Plan Note (Signed)
 Patient reports that she had a Pap smear on May 2025.  She is up-to-date on her mammogram and colonoscopy.

## 2024-06-07 ENCOUNTER — Ambulatory Visit: Payer: Self-pay | Admitting: Student

## 2024-06-07 ENCOUNTER — Other Ambulatory Visit: Payer: Self-pay | Admitting: Student

## 2024-06-07 DIAGNOSIS — I10 Essential (primary) hypertension: Secondary | ICD-10-CM

## 2024-06-07 MED ORDER — ROSUVASTATIN CALCIUM 20 MG PO TABS: 20.0000 mg | ORAL_TABLET | Freq: Every day | ORAL | 2 refills | Status: AC

## 2024-06-15 ENCOUNTER — Other Ambulatory Visit: Payer: Self-pay | Admitting: Student

## 2024-06-15 DIAGNOSIS — K219 Gastro-esophageal reflux disease without esophagitis: Secondary | ICD-10-CM

## 2024-06-25 ENCOUNTER — Other Ambulatory Visit: Payer: Self-pay | Admitting: Student

## 2024-06-25 DIAGNOSIS — I10 Essential (primary) hypertension: Secondary | ICD-10-CM

## 2024-06-25 DIAGNOSIS — J019 Acute sinusitis, unspecified: Secondary | ICD-10-CM

## 2024-07-28 ENCOUNTER — Other Ambulatory Visit: Payer: Self-pay | Admitting: Student

## 2024-07-28 DIAGNOSIS — L309 Dermatitis, unspecified: Secondary | ICD-10-CM

## 2024-08-22 ENCOUNTER — Encounter: Admitting: Student

## 2024-09-21 ENCOUNTER — Encounter: Payer: Self-pay | Admitting: Student

## 2024-09-27 NOTE — Patient Instructions (Incomplete)
 Thank you, Ms.Myrick CHRISTELLA Rummer for allowing us  to provide your care today. Today we discussed your concerns about hearing your heartbeat in your ears.    Referrals: -  New medications: -  I have ordered the following labs for you:  Lab Orders  No laboratory test(s) ordered today     I will call if any are abnormal. All of your labs can be accessed through My Chart.   My Chart Access: [email or web address]  Please follow-up in:    We look forward to seeing you next time. Please call our clinic at [phone] if you have any questions or concerns. The best time to call is Monday-Friday from 9am-4pm, but there is someone available 24/7. If after hours or the weekend, call the main hospital number and ask for the Internal Medicine Resident On-Call. If you need medication refills, please notify your pharmacy one week in advance and they will send us  a request.   Thank you for letting us  take part in your care. Wishing you the best!  Shakiyah Cirilo, DO 09/27/2024, 1:39 PM [facility]

## 2024-09-27 NOTE — Progress Notes (Unsigned)
   Acute Office Visit  Subjective:     Patient ID: Amy Jarvis, female    DOB: 07-23-1961, 63 y.o.   MRN: [id number]  No chief complaint on file.   Amy Jarvis is a 63 year old female with a past medical history of hypertension, OSA, morbid obesity, hyperlipidemia, and prediabetes who presents for an acute visit today regarding her left ear. She states she has been able to hear her heartbeat for some time in this ear. Please see problem-based assessment and plan below for details.     ROS      Objective:    LMP 03/31/2013  Physical Exam  No results found for any visits on 09/28/24.      Assessment & Plan:   Patient seen with {IMTSattending2025/2026:32924}.   Problem List Items Addressed This Visit   None   No orders of the defined types were placed in this encounter.   No follow-ups on file.  Pulsatile tinnitus vs cerumen impaction (of which she has a history of) vs eustachian tube dysfunction -needs ENT referral, previously seen by [facility] in 2019 -check BP to make sure she is not overly hypertensive  Jayant Kriz, DO Internal Medicine Resident, PGY-1 1:35 PM 09/27/2024

## 2024-09-28 ENCOUNTER — Ambulatory Visit (INDEPENDENT_AMBULATORY_CARE_PROVIDER_SITE_OTHER)

## 2024-09-28 ENCOUNTER — Other Ambulatory Visit: Payer: Self-pay

## 2024-09-28 VITALS — BP 142/85 | HR 102 | Temp 97.9°F | Ht 67.0 in | Wt 344.2 lb

## 2024-09-28 DIAGNOSIS — Z79899 Other long term (current) drug therapy: Secondary | ICD-10-CM | POA: Diagnosis not present

## 2024-09-28 DIAGNOSIS — I10 Essential (primary) hypertension: Secondary | ICD-10-CM

## 2024-09-28 DIAGNOSIS — Z23 Encounter for immunization: Secondary | ICD-10-CM | POA: Diagnosis not present

## 2024-09-28 DIAGNOSIS — Z0000 Encounter for general adult medical examination without abnormal findings: Secondary | ICD-10-CM

## 2024-09-28 DIAGNOSIS — H93A2 Pulsatile tinnitus, left ear: Secondary | ICD-10-CM | POA: Insufficient documentation

## 2024-09-28 DIAGNOSIS — Z8249 Family history of ischemic heart disease and other diseases of the circulatory system: Secondary | ICD-10-CM

## 2024-09-28 NOTE — Assessment & Plan Note (Signed)
 Patient has been taking her antihypertensives daily, BP today 142/85. She is reporting a history of hypokalemia and states during her July visit with Dr. Toma that she was supposed to check her BMP and Magnesium . She has not had these done and would like to check them today.   -BMP, Magnesium 

## 2024-09-28 NOTE — Progress Notes (Signed)
 Internal Medicine Clinic Attending  I was physically present during the key portions of the resident provided service and participated in the medical decision making of patient's management care. I reviewed pertinent patient test results.  The assessment, diagnosis, and plan were formulated together and I agree with the documentation in the resident's note.  Jeanelle Layman CROME, MD

## 2024-09-28 NOTE — Assessment & Plan Note (Signed)
 Patient would like her flu and PCV20 shot today.

## 2024-09-28 NOTE — Assessment & Plan Note (Addendum)
 Patient presents to clinic today for ongoing sensation in her left ear that she describes as a heartbeat. This has been occurring for the better part of 10 years, but has recently discussed her symptoms with her daughter, who informed her this could be related to her carotid arteries. Patient does have a history of tinnitus, vertigo, and impacted cerumen, as well as a history of aortic atherosclerosis, and would like to check to make sure her carotids are not contributory to her symptoms. Discussed how her symptoms may also be related to eustachian tube dysfunction, and she states she had previously seen [facility] in 2019. BP today 142/85, patient denies headaches, chest pain, or changes in vision.  Discussed with patient that her symptoms are likely due to her history of tinnitus and her impacted cerumen of her left ear canal on exam. Informed patient to purchase OTC earwax remover and apply it daily to loosen and remove the wax in her canals, monitoring for symptom improvement. If her symptoms do not improve, then to return in 3 weeks and we can assess for further causes and can place an order for a bilateral carotid US  at that time. Patient agreeable to plan.   - Discussed OTC Debrox to help with cerumen in her left ear canal - Follow up in 3 weeks, if no improvement, consider ordering US  carotids

## 2024-09-29 ENCOUNTER — Ambulatory Visit: Payer: Self-pay

## 2024-09-29 LAB — BASIC METABOLIC PANEL WITH GFR
BUN/Creatinine Ratio: 22 (ref 12–28)
BUN: 14 mg/dL (ref 8–27)
CO2: 22 mmol/L (ref 20–29)
Calcium: 9.9 mg/dL (ref 8.7–10.3)
Chloride: 102 mmol/L (ref 96–106)
Creatinine, Ser: 0.64 mg/dL (ref 0.57–1.00)
Glucose: 93 mg/dL (ref 70–99)
Potassium: 4.1 mmol/L (ref 3.5–5.2)
Sodium: 143 mmol/L (ref 134–144)
eGFR: 99 mL/min/1.73 (ref >59)

## 2024-09-29 LAB — MAGNESIUM: Magnesium: 2.2 mg/dL (ref 1.6–2.3)

## 2024-09-29 NOTE — Progress Notes (Signed)
 BMP and Mag WNL. No further follow up needed.

## 2024-11-23 ENCOUNTER — Other Ambulatory Visit: Payer: Self-pay | Admitting: Student

## 2024-11-23 DIAGNOSIS — E785 Hyperlipidemia, unspecified: Secondary | ICD-10-CM

## 2024-11-27 NOTE — Telephone Encounter (Signed)
 Medication discontinued 06/07/24
